# Patient Record
Sex: Female | Born: 1940 | Race: White | Hispanic: No | Marital: Single | Smoking: Current every day smoker
Health system: Southern US, Community
[De-identification: ages and names within clinical notes are randomized; demographics above are authoritative.]

## PROBLEM LIST (undated history)

## (undated) DIAGNOSIS — F0390 Unspecified dementia without behavioral disturbance: Secondary | ICD-10-CM

## (undated) DIAGNOSIS — F0280 Dementia in other diseases classified elsewhere without behavioral disturbance: Secondary | ICD-10-CM

## (undated) DIAGNOSIS — G309 Alzheimer's disease, unspecified: Secondary | ICD-10-CM

---

## 2010-03-10 ENCOUNTER — Ambulatory Visit: Payer: Self-pay | Admitting: Family Medicine

## 2010-03-10 DIAGNOSIS — R030 Elevated blood-pressure reading, without diagnosis of hypertension: Secondary | ICD-10-CM

## 2010-03-10 DIAGNOSIS — J320 Chronic maxillary sinusitis: Secondary | ICD-10-CM | POA: Insufficient documentation

## 2010-11-24 NOTE — Assessment & Plan Note (Signed)
Summary: SINUS INFECTION/JBB   Vital Signs:  Patient Profile:   70 Years Old Female CC:      Cold & URI symptoms/RWT Height:     63 inches Weight:      114 pounds BMI:     20.27 O2 Sat:      96 % O2 treatment:    Room Air Temp:     97.1 degrees F oral Pulse rate:   72 / minute Pulse rhythm:   regular Resp:     20 per minute BP sitting:   156 / 96  (left arm)  Pt. in pain?   no  Vitals Entered By: Levonne Spiller EMT-P (Mar 10, 2010 11:51 AM)              Is Patient Diabetic? No      Current Allergies: No known allergies History of Present Illness History from: patient Reason for visit: see chief complaint Chief Complaint: Cold & URI symptoms/RWT History of Present Illness: Has "been fighting a sinus infection" off and on for about a month. She has used otc nasal rinses, which help some, and mucinex with pseudoephedrine also with very temporary relief. She has a history of sinus difficulties but denies seasonal allergies. She reports maxillary sinus pressure and left ear pressure. + rhinorrhea and occ prod cough. No f/c. + fatigue and stress.   REVIEW OF SYSTEMS Constitutional Symptoms      Denies fever, chills, and night sweats.  Ear/Nose/Throat/Mouth       Complains of frequent runny nose and sinus problems.      Denies dizziness, frequent nose bleeds, sore throat, and hoarseness.  Respiratory       Denies dry cough, productive cough, wheezing, shortness of breath, asthma, bronchitis, and emphysema/COPD.  Cardiovascular       Denies chest pain.      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 She is very active and her diet is appropriate. Reinforced no added salt and to be aware of content of salt in foods. She used to do yoga and I thought this was a good idea to restart. She has not used pseudoephedrine today, and discussed that this may elevate her blood pressure. She can return to have her blood pressure checked here or at a pharmacy 1-2 x / week.  Follow Up: Follow up on an as needed basis  The patient and/or caregiver has been counseled thoroughly with regard to medications prescribed including dosage, schedule, interactions, rationale for use, and possible side effects and they verbalize understanding.  Diagnoses and expected course of recovery discussed and will return if not improved as expected or if the condition worsens. Patient and/or caregiver verbalized understanding.  Prescriptions: ZITHROMAX Z-PAK 250 MG TABS (AZITHROMYCIN) as directed for infection  #  1 pk x 0   Entered and Authorized by:   Tacey Ruiz MD   Signed by:   Tacey Ruiz MD on 03/10/2010   Method used:   Electronically to        [facility] (retail)       [address], [address]       [place], [place]  [address]       Ph: [phone]       Fax: [phone]   RxID:   [id number]   Patient Instructions: 1)  Take your antibiotic as prescribed until ALL of it is gone, but stop if you develop a rash or swelling and contact our office as soon as possible. 2)  Acute sinusitis  symptoms for less than 10 days are not helped by antibiotics.Use warm moist compresses, and over the counter decongestants ( only as directed). Call if no improvement in 5-7 days, sooner if increasing pain, fever, or new symptoms. _____________________________________________________________ The patient was informed that there is no on-call provider or services available at this clinic during off-hours (when the clinic is closed).  If the patient developed a problem or concern that required immediate attention, the patient was advised to go the the nearest available urgent care or emergency department for medical care.  The patient verbalized understanding.     The risks, benefits and possible side effects were clearly explained and discussed with the patient.  The patient verbalized clear understanding.  The patient was given instructions to return if symptoms don't improve, worsen or new changes develop.  If it is not during clinic hours and the patient cannot get back to this clinic then the patient was told to seek medical care at an available urgent care or emergency department.  The patient verbalized understanding.    I have reviewed the above medical office visit documention, including diagnoses, history, medications, clinical lists, orders and plan of care.   Rodney Langton, MD, FAAFP  Mar 10, 2010

## 2014-11-22 DIAGNOSIS — R030 Elevated blood-pressure reading, without diagnosis of hypertension: Secondary | ICD-10-CM | POA: Diagnosis not present

## 2014-11-22 DIAGNOSIS — K122 Cellulitis and abscess of mouth: Secondary | ICD-10-CM | POA: Diagnosis not present

## 2014-12-05 DIAGNOSIS — K0251 Dental caries on pit and fissure surface limited to enamel: Secondary | ICD-10-CM | POA: Diagnosis not present

## 2015-11-23 DIAGNOSIS — G301 Alzheimer's disease with late onset: Secondary | ICD-10-CM | POA: Diagnosis not present

## 2015-11-23 DIAGNOSIS — F0280 Dementia in other diseases classified elsewhere without behavioral disturbance: Secondary | ICD-10-CM | POA: Diagnosis not present

## 2015-11-23 DIAGNOSIS — Z9181 History of falling: Secondary | ICD-10-CM | POA: Diagnosis not present

## 2015-11-24 DIAGNOSIS — G301 Alzheimer's disease with late onset: Secondary | ICD-10-CM | POA: Diagnosis not present

## 2015-11-24 DIAGNOSIS — Z9181 History of falling: Secondary | ICD-10-CM | POA: Diagnosis not present

## 2015-11-24 DIAGNOSIS — F0280 Dementia in other diseases classified elsewhere without behavioral disturbance: Secondary | ICD-10-CM | POA: Diagnosis not present

## 2015-11-25 DIAGNOSIS — G301 Alzheimer's disease with late onset: Secondary | ICD-10-CM | POA: Diagnosis not present

## 2015-11-25 DIAGNOSIS — F0280 Dementia in other diseases classified elsewhere without behavioral disturbance: Secondary | ICD-10-CM | POA: Diagnosis not present

## 2015-11-25 DIAGNOSIS — Z9181 History of falling: Secondary | ICD-10-CM | POA: Diagnosis not present

## 2015-11-28 DIAGNOSIS — G301 Alzheimer's disease with late onset: Secondary | ICD-10-CM | POA: Diagnosis not present

## 2015-11-28 DIAGNOSIS — F0280 Dementia in other diseases classified elsewhere without behavioral disturbance: Secondary | ICD-10-CM | POA: Diagnosis not present

## 2015-11-28 DIAGNOSIS — Z9181 History of falling: Secondary | ICD-10-CM | POA: Diagnosis not present

## 2016-01-14 DIAGNOSIS — Z7689 Persons encountering health services in other specified circumstances: Secondary | ICD-10-CM | POA: Diagnosis not present

## 2016-01-14 DIAGNOSIS — B029 Zoster without complications: Secondary | ICD-10-CM | POA: Diagnosis not present

## 2016-01-14 DIAGNOSIS — Z1389 Encounter for screening for other disorder: Secondary | ICD-10-CM | POA: Diagnosis not present

## 2016-01-19 DIAGNOSIS — B0229 Other postherpetic nervous system involvement: Secondary | ICD-10-CM | POA: Diagnosis not present

## 2016-01-19 DIAGNOSIS — B029 Zoster without complications: Secondary | ICD-10-CM | POA: Diagnosis not present

## 2016-01-19 DIAGNOSIS — Z7689 Persons encountering health services in other specified circumstances: Secondary | ICD-10-CM | POA: Diagnosis not present

## 2016-09-27 DIAGNOSIS — R413 Other amnesia: Secondary | ICD-10-CM | POA: Diagnosis not present

## 2016-09-27 DIAGNOSIS — R946 Abnormal results of thyroid function studies: Secondary | ICD-10-CM | POA: Diagnosis not present

## 2016-09-28 ENCOUNTER — Other Ambulatory Visit: Payer: Self-pay | Admitting: Nurse Practitioner

## 2016-09-28 DIAGNOSIS — F0281 Dementia in other diseases classified elsewhere with behavioral disturbance: Secondary | ICD-10-CM | POA: Diagnosis not present

## 2016-09-28 DIAGNOSIS — F0390 Unspecified dementia without behavioral disturbance: Secondary | ICD-10-CM

## 2016-09-28 DIAGNOSIS — G301 Alzheimer's disease with late onset: Secondary | ICD-10-CM | POA: Diagnosis not present

## 2016-10-01 DIAGNOSIS — F0280 Dementia in other diseases classified elsewhere without behavioral disturbance: Secondary | ICD-10-CM | POA: Diagnosis not present

## 2016-10-01 DIAGNOSIS — Z9181 History of falling: Secondary | ICD-10-CM | POA: Diagnosis not present

## 2016-10-01 DIAGNOSIS — G301 Alzheimer's disease with late onset: Secondary | ICD-10-CM | POA: Diagnosis not present

## 2016-10-04 ENCOUNTER — Ambulatory Visit: Payer: Commercial Managed Care - HMO

## 2018-09-10 ENCOUNTER — Emergency Department: Payer: Medicare Other

## 2018-09-10 ENCOUNTER — Other Ambulatory Visit: Payer: Self-pay

## 2018-09-10 ENCOUNTER — Inpatient Hospital Stay
Admission: EM | Admit: 2018-09-10 | Discharge: 2018-09-14 | DRG: 871 | Disposition: A | Discharge status: Skilled Nursing Facility | Payer: Medicare Other | Source: Skilled Nursing Facility | Attending: Internal Medicine | Admitting: Internal Medicine

## 2018-09-10 DIAGNOSIS — I248 Other forms of acute ischemic heart disease: Secondary | ICD-10-CM | POA: Diagnosis present

## 2018-09-10 DIAGNOSIS — E039 Hypothyroidism, unspecified: Secondary | ICD-10-CM | POA: Diagnosis present

## 2018-09-10 DIAGNOSIS — E871 Hypo-osmolality and hyponatremia: Secondary | ICD-10-CM | POA: Diagnosis present

## 2018-09-10 DIAGNOSIS — I340 Nonrheumatic mitral (valve) insufficiency: Secondary | ICD-10-CM | POA: Diagnosis not present

## 2018-09-10 DIAGNOSIS — J440 Chronic obstructive pulmonary disease with acute lower respiratory infection: Secondary | ICD-10-CM | POA: Diagnosis present

## 2018-09-10 DIAGNOSIS — R05 Cough: Secondary | ICD-10-CM | POA: Diagnosis present

## 2018-09-10 DIAGNOSIS — E43 Unspecified severe protein-calorie malnutrition: Secondary | ICD-10-CM

## 2018-09-10 DIAGNOSIS — R1310 Dysphagia, unspecified: Secondary | ICD-10-CM | POA: Diagnosis present

## 2018-09-10 DIAGNOSIS — Z66 Do not resuscitate: Secondary | ICD-10-CM | POA: Diagnosis present

## 2018-09-10 DIAGNOSIS — E878 Other disorders of electrolyte and fluid balance, not elsewhere classified: Secondary | ICD-10-CM | POA: Diagnosis present

## 2018-09-10 DIAGNOSIS — E876 Hypokalemia: Secondary | ICD-10-CM | POA: Diagnosis present

## 2018-09-10 DIAGNOSIS — Z681 Body mass index (BMI) 19 or less, adult: Secondary | ICD-10-CM

## 2018-09-10 DIAGNOSIS — J441 Chronic obstructive pulmonary disease with (acute) exacerbation: Secondary | ICD-10-CM | POA: Diagnosis present

## 2018-09-10 DIAGNOSIS — F0280 Dementia in other diseases classified elsewhere without behavioral disturbance: Secondary | ICD-10-CM | POA: Diagnosis present

## 2018-09-10 DIAGNOSIS — J9601 Acute respiratory failure with hypoxia: Secondary | ICD-10-CM | POA: Diagnosis not present

## 2018-09-10 DIAGNOSIS — G309 Alzheimer's disease, unspecified: Secondary | ICD-10-CM | POA: Diagnosis present

## 2018-09-10 DIAGNOSIS — Z515 Encounter for palliative care: Secondary | ICD-10-CM

## 2018-09-10 DIAGNOSIS — J9621 Acute and chronic respiratory failure with hypoxia: Secondary | ICD-10-CM | POA: Diagnosis present

## 2018-09-10 DIAGNOSIS — J189 Pneumonia, unspecified organism: Secondary | ICD-10-CM | POA: Diagnosis not present

## 2018-09-10 DIAGNOSIS — F17200 Nicotine dependence, unspecified, uncomplicated: Secondary | ICD-10-CM | POA: Diagnosis present

## 2018-09-10 DIAGNOSIS — D649 Anemia, unspecified: Secondary | ICD-10-CM | POA: Diagnosis present

## 2018-09-10 DIAGNOSIS — J9622 Acute and chronic respiratory failure with hypercapnia: Secondary | ICD-10-CM | POA: Diagnosis present

## 2018-09-10 DIAGNOSIS — A419 Sepsis, unspecified organism: Principal | ICD-10-CM | POA: Diagnosis present

## 2018-09-10 DIAGNOSIS — J9600 Acute respiratory failure, unspecified whether with hypoxia or hypercapnia: Secondary | ICD-10-CM

## 2018-09-10 DIAGNOSIS — E860 Dehydration: Secondary | ICD-10-CM | POA: Diagnosis present

## 2018-09-10 DIAGNOSIS — J188 Other pneumonia, unspecified organism: Secondary | ICD-10-CM

## 2018-09-10 DIAGNOSIS — I351 Nonrheumatic aortic (valve) insufficiency: Secondary | ICD-10-CM | POA: Diagnosis not present

## 2018-09-10 DIAGNOSIS — I361 Nonrheumatic tricuspid (valve) insufficiency: Secondary | ICD-10-CM | POA: Diagnosis not present

## 2018-09-10 DIAGNOSIS — E870 Hyperosmolality and hypernatremia: Secondary | ICD-10-CM | POA: Diagnosis present

## 2018-09-10 DIAGNOSIS — R0902 Hypoxemia: Secondary | ICD-10-CM

## 2018-09-10 HISTORY — DX: Unspecified dementia, unspecified severity, without behavioral disturbance, psychotic disturbance, mood disturbance, and anxiety: F03.90

## 2018-09-10 LAB — MRSA PCR SCREENING: MRSA BY PCR: NEGATIVE

## 2018-09-10 LAB — COMPREHENSIVE METABOLIC PANEL
ALBUMIN: 2.9 g/dL — AB (ref 3.5–5.0)
ALK PHOS: 159 U/L — AB (ref 38–126)
ALT: 79 U/L — AB (ref 0–44)
ANION GAP: 10 (ref 5–15)
AST: 63 U/L — AB (ref 15–41)
BUN: 27 mg/dL — AB (ref 8–23)
CALCIUM: 8.9 mg/dL (ref 8.9–10.3)
CO2: 31 mmol/L (ref 22–32)
Chloride: 109 mmol/L (ref 98–111)
Creatinine, Ser: 0.78 mg/dL (ref 0.44–1.00)
GFR calc Af Amer: >60 mL/min (ref >60)
GFR calc non Af Amer: >60 mL/min (ref >60)
Glucose, Bld: 161 mg/dL — ABNORMAL HIGH (ref 70–99)
Potassium: 3.7 mmol/L (ref 3.5–5.1)
SODIUM: 150 mmol/L — AB (ref 135–145)
Total Bilirubin: 0.9 mg/dL (ref 0.3–1.2)
Total Protein: 6.7 g/dL (ref 6.5–8.1)

## 2018-09-10 LAB — MAGNESIUM: Magnesium: 2.1 mg/dL (ref 1.7–2.4)

## 2018-09-10 LAB — CBC WITH DIFFERENTIAL/PLATELET
ABS IMMATURE GRANULOCYTES: 0.35 10*3/uL — AB (ref 0.00–0.07)
Basophils Absolute: 0.1 10*3/uL (ref 0.0–0.1)
Basophils Relative: 0 %
EOS ABS: 0.1 10*3/uL (ref 0.0–0.5)
Eosinophils Relative: 0 %
HCT: 37.9 % (ref 36.0–46.0)
Hemoglobin: 11.9 g/dL — ABNORMAL LOW (ref 12.0–15.0)
IMMATURE GRANULOCYTES: 1 %
Lymphocytes Relative: 2 %
Lymphs Abs: 0.5 10*3/uL — ABNORMAL LOW (ref 0.7–4.0)
MCH: 31.4 pg (ref 26.0–34.0)
MCHC: 31.4 g/dL (ref 30.0–36.0)
MCV: 100 fL (ref 80.0–100.0)
MONOS PCT: 7 %
Monocytes Absolute: 2 10*3/uL — ABNORMAL HIGH (ref 0.1–1.0)
NEUTROS PCT: 90 %
Neutro Abs: 24.3 10*3/uL — ABNORMAL HIGH (ref 1.7–7.7)
Platelets: 652 10*3/uL — ABNORMAL HIGH (ref 150–400)
RBC: 3.79 MIL/uL — ABNORMAL LOW (ref 3.87–5.11)
RDW: 13.6 % (ref 11.5–15.5)
WBC: 27.3 10*3/uL — ABNORMAL HIGH (ref 4.0–10.5)
nRBC: 0 % (ref 0.0–0.2)

## 2018-09-10 LAB — CG4 I-STAT (LACTIC ACID)
LACTIC ACID, VENOUS: 1.9 mmol/L (ref 0.5–1.9)
Lactic Acid, Venous: 1.69 mmol/L (ref 0.5–1.9)

## 2018-09-10 LAB — PROTIME-INR
INR: 1.12
Prothrombin Time: 14.3 seconds (ref 11.4–15.2)

## 2018-09-10 LAB — GLUCOSE, CAPILLARY: GLUCOSE-CAPILLARY: 176 mg/dL — AB (ref 70–99)

## 2018-09-10 LAB — BRAIN NATRIURETIC PEPTIDE: B NATRIURETIC PEPTIDE 5: 314 pg/mL — AB (ref 0.0–100.0)

## 2018-09-10 LAB — TROPONIN I: TROPONIN I: 0.3 ng/mL — AB (ref <0.03)

## 2018-09-10 LAB — TSH: TSH: <0.01 u[IU]/mL — ABNORMAL LOW (ref 0.350–4.500)

## 2018-09-10 MED ORDER — ONDANSETRON HCL 4 MG/2ML IJ SOLN: 4.0000 mg | Freq: Four times a day (QID) | INTRAMUSCULAR | Status: DC | PRN

## 2018-09-10 MED ORDER — ORAL CARE MOUTH RINSE
15.0000 mL | Freq: Two times a day (BID) | OROMUCOSAL | Status: DC
  Administered 2018-09-10 – 2018-09-14 (×5): 15 mL via OROMUCOSAL
  Filled 2018-09-10: qty 15

## 2018-09-10 MED ORDER — ALBUTEROL SULFATE (2.5 MG/3ML) 0.083% IN NEBU
7.5000 mg | INHALATION_SOLUTION | Freq: Once | RESPIRATORY_TRACT | Status: AC
  Administered 2018-09-10: 7.5 mg via RESPIRATORY_TRACT
  Filled 2018-09-10: qty 9

## 2018-09-10 MED ORDER — ONDANSETRON HCL 4 MG PO TABS: 4.0000 mg | ORAL_TABLET | Freq: Four times a day (QID) | ORAL | Status: DC | PRN

## 2018-09-10 MED ORDER — VANCOMYCIN HCL IN DEXTROSE 1-5 GM/200ML-% IV SOLN
1000.0000 mg | Freq: Once | INTRAVENOUS | Status: AC
  Administered 2018-09-10: 1000 mg via INTRAVENOUS
  Filled 2018-09-10: qty 200

## 2018-09-10 MED ORDER — ACETAMINOPHEN 325 MG PO TABS: 650.0000 mg | ORAL_TABLET | Freq: Four times a day (QID) | ORAL | Status: DC | PRN

## 2018-09-10 MED ORDER — CHLORHEXIDINE GLUCONATE 0.12 % MT SOLN
15.0000 mL | Freq: Two times a day (BID) | OROMUCOSAL | Status: DC
  Administered 2018-09-10 – 2018-09-14 (×6): 15 mL via OROMUCOSAL
  Filled 2018-09-10 (×6): qty 15

## 2018-09-10 MED ORDER — SODIUM CHLORIDE 0.9 % IV SOLN
2.0000 g | Freq: Once | INTRAVENOUS | Status: AC
  Administered 2018-09-10: 2 g via INTRAVENOUS
  Filled 2018-09-10: qty 2

## 2018-09-10 MED ORDER — RISPERIDONE 1 MG PO TABS
1.0000 mg | ORAL_TABLET | Freq: Two times a day (BID) | ORAL | Status: DC
  Administered 2018-09-12 – 2018-09-14 (×5): 1 mg via ORAL
  Filled 2018-09-10 (×9): qty 1

## 2018-09-10 MED ORDER — METHYLPREDNISOLONE SODIUM SUCC 40 MG IJ SOLR
40.0000 mg | Freq: Two times a day (BID) | INTRAMUSCULAR | Status: DC
  Administered 2018-09-11 – 2018-09-12 (×3): 40 mg via INTRAVENOUS
  Filled 2018-09-10 (×3): qty 1

## 2018-09-10 MED ORDER — IPRATROPIUM-ALBUTEROL 0.5-2.5 (3) MG/3ML IN SOLN
3.0000 mL | RESPIRATORY_TRACT | Status: DC
  Administered 2018-09-10 – 2018-09-12 (×13): 3 mL via RESPIRATORY_TRACT
  Filled 2018-09-10 (×13): qty 3

## 2018-09-10 MED ORDER — ACETAMINOPHEN 650 MG RE SUPP: 650.0000 mg | Freq: Four times a day (QID) | RECTAL | Status: DC | PRN

## 2018-09-10 MED ORDER — DEXTROSE-NACL 5-0.45 % IV SOLN
INTRAVENOUS | Status: DC
  Administered 2018-09-10 – 2018-09-13 (×4): via INTRAVENOUS

## 2018-09-10 MED ORDER — ENOXAPARIN SODIUM 30 MG/0.3ML ~~LOC~~ SOLN
30.0000 mg | SUBCUTANEOUS | Status: DC
  Administered 2018-09-10 – 2018-09-13 (×4): 30 mg via SUBCUTANEOUS
  Filled 2018-09-10 (×4): qty 0.3

## 2018-09-10 MED ORDER — IPRATROPIUM-ALBUTEROL 0.5-2.5 (3) MG/3ML IN SOLN
6.0000 mL | Freq: Once | RESPIRATORY_TRACT | Status: AC
  Administered 2018-09-10: 6 mL via RESPIRATORY_TRACT
  Filled 2018-09-10: qty 6

## 2018-09-10 MED ORDER — ALBUTEROL SULFATE (2.5 MG/3ML) 0.083% IN NEBU
2.5000 mg | INHALATION_SOLUTION | Freq: Once | RESPIRATORY_TRACT | Status: AC
  Administered 2018-09-10: 2.5 mg via RESPIRATORY_TRACT
  Filled 2018-09-10: qty 3

## 2018-09-10 MED ORDER — METHYLPREDNISOLONE SODIUM SUCC 125 MG IJ SOLR
125.0000 mg | Freq: Once | INTRAMUSCULAR | Status: AC
  Administered 2018-09-10: 125 mg via INTRAVENOUS
  Filled 2018-09-10: qty 2

## 2018-09-10 MED ORDER — METHIMAZOLE 5 MG PO TABS
5.0000 mg | ORAL_TABLET | Freq: Every day | ORAL | Status: DC
  Administered 2018-09-12 – 2018-09-14 (×3): 5 mg via ORAL
  Filled 2018-09-10 (×4): qty 1

## 2018-09-10 MED ORDER — SODIUM CHLORIDE 0.9 % IV SOLN
2.0000 g | Freq: Two times a day (BID) | INTRAVENOUS | Status: DC
  Administered 2018-09-11 – 2018-09-12 (×3): 2 g via INTRAVENOUS
  Filled 2018-09-10 (×4): qty 2

## 2018-09-10 MED ORDER — SODIUM CHLORIDE 0.9 % IV BOLUS
1000.0000 mL | Freq: Once | INTRAVENOUS | Status: AC
  Administered 2018-09-10: 1000 mL via INTRAVENOUS

## 2018-09-10 MED ORDER — VANCOMYCIN HCL IN DEXTROSE 750-5 MG/150ML-% IV SOLN
750.0000 mg | INTRAVENOUS | Status: DC
  Filled 2018-09-10: qty 150

## 2018-09-10 MED ORDER — ENOXAPARIN SODIUM 40 MG/0.4ML ~~LOC~~ SOLN
30.0000 mg | SUBCUTANEOUS | Status: DC
  Filled 2018-09-10: qty 0.4

## 2018-09-10 MED ORDER — BUDESONIDE 0.5 MG/2ML IN SUSP
0.5000 mg | Freq: Two times a day (BID) | RESPIRATORY_TRACT | Status: DC
  Administered 2018-09-10 – 2018-09-14 (×8): 0.5 mg via RESPIRATORY_TRACT
  Filled 2018-09-10 (×8): qty 2

## 2018-09-10 NOTE — Consult Note (Signed)
   Name: Shelly NorrieDiane Freeman MRN: [id number] DOB: 03/08/1941     CONSULTATION DATE: 09/10/2018  REFERRING MD :  Rinaldo RatelKhalisetti  CHIEF COMPLAINT:  SOB  STUDIES:  CXR 11.17.19 multifocal opacities C/w pneumonia   HISTORY OF PRESENT ILLNESS:  77 y.o. female with a known history of moderate Alzheimer's dementia who just moved into an assisted living facility brought in secondary to worsening cough, fever and congestion since yesterday. + very malnourished a from assisted living facility + noted to be dyspneic and tachycardic placed on biPAP    Being admitted to stepdown for treatment of multifocal pneumonia.  Based on CXR increased lung volumes, flattened diapghrams c/w COPD Comatosed, stuperous  PAST MEDICAL HISTORY :   has a past medical history of Dementia (HCC).  has no past surgical history on file. Prior to Admission medications   Medication Sig Start Date End Date Taking? Authorizing Provider  risperiDONE (RISPERDAL) 1 MG tablet Take 1 tablet by mouth. Take 1 tab at bedtime. May repeat after 1 hour if needed for agitation. 08/18/18  Yes [provider]   No Known Allergies  FAMILY HISTORY:  Family history is unknown by patient. SOCIAL HISTORY:  reports that she has been smoking. She has never used smokeless tobacco. She reports that she does not drink alcohol or use drugs.  REVIEW OF SYSTEMS:   Unable to obtain due to critical illness   VITAL SIGNS: Temp:  [98 F (36.7 C)] 98 F (36.7 C) (11/17 1352) Pulse Rate:  [36-106] 36 (11/17 1600) Resp:  [17-29] 20 (11/17 1600) BP: (114-144)/(63-100) 114/63 (11/17 1600) SpO2:  [87 %-99 %] 99 % (11/17 1600) Weight:  [45.4 kg] 45.4 kg (11/17 1343)  Physical Examination:  GENERAL:critically ill appearing, +resp distress HEAD: Normocephalic, atraumatic.  EYES: Pupils equal, round, reactive to light.  No scleral icterus.  MOUTH: Moist mucosal membrane. NECK: Supple. No JVD.  PULMONARY: +rhonchi,  +wheezing CARDIOVASCULAR: S1 and S2. Regular rate and rhythm. No murmurs, rubs, or gallops.  GASTROINTESTINAL: Soft, nontender, -distended. No masses. Positive bowel sounds. No hepatosplenomegaly.  MUSCULOSKELETAL: No swelling, clubbing, or edema.  NEUROLOGIC: obtunded SKIN:intact,warm,dry    ASSESSMENT / PLAN: 77 yo very thin and frail WF with acute and severe resp failure with COPD exacerbation and multifocal pneumonia on biPAP  Severe Hypoxic and Hypercapnic Respiratory Failure Wean off biPAP as tolerated  SEVERE COPD EXACERBATION -continue IV steroids as prescribed -continue NEB THERAPY as prescribed -morphine as needed -wean fio2 as needed and tolerated  INFECTIOUS DISEASE-multifocal pneumonia -continue antibiotics as prescribed -follow up cultures     Recommend DNR status Recommend hospice referral for end stage dementia and failure thrive   Critical Care Time devoted to patient care services described in this note is 45 minutes.   Overall, patient is critically ill, prognosis is guarded.  Patient with Multiorgan failure and at high risk for cardiac arrest and death.    Lucie LeatherKurian David Melyssa Signor, M.D.  [facility]  Medical Director [facility] Medical Director [facility] Cardio-Pulmonary Department

## 2018-09-10 NOTE — Progress Notes (Signed)
Pt taken off bipap, placed on 2lpm [place], sats 92%, respiratory rate 16/min. Tolerating well at this time. Will continue to monitor.

## 2018-09-10 NOTE — ED Notes (Signed)
Pt constantly attempting to pull bi-pap off

## 2018-09-10 NOTE — ED Notes (Signed)
Dr Pershing ProudSchaevitz notified of elevated troponin of 0.30 and pt desat - new orders received and pt placed on bi-pap

## 2018-09-10 NOTE — Clinical Social Work Note (Signed)
Clinical Social Work Assessment  Patient Details  Name: Shelly NorrieDiane Warmack MRN: [id number] Date of Birth: 09/16/1941  Date of referral:  09/10/18               Reason for consult:  Other (Comment Required)(from [facility] )                Permission sought to share information with:    Permission granted to share information::     Name::        Agency::     Relationship::     Contact Information:     Housing/Transportation Living arrangements for the past 2 months:  Assisted Living Facility Source of Information:  Facility Patient Interpreter Needed:  None Criminal Activity/Legal Involvement Pertinent to Current Situation/Hospitalization:  No - Comment as needed Significant Relationships:  Adult Children Lives with:  Facility Resident Do you feel safe going back to the place where you live?  Yes Need for family participation in patient care:  Yes (Comment)  Care giving concerns:  [facility] located at [address], [place] (fax: [phone]).   Social Worker assessment / plan:  Visual merchandiserClinical Social Worker (CSW) reviewed chart and noted that patient is from [facility]. Per chart patient is confused and no family is at bedside. CSW contacted [facility] administrator to complete assessment. Per Liborio NixonJanice patient was [place] [facility] because patient's 2 sons live in [place]. Per Liborio NixonJanice DSS is not pursing guardianship because patient's sons did sign paper work for placement. Per Liborio NixonJanice she will follow up with CSW tomorrow and give sons contact information. Per Liborio NixonJanice patient is independent with ambulation at baseline and does not use a walker. Per Liborio NixonJanice patient is on room air and can return to the facility when stable for D/C. Per Liborio NixonJanice the sons are patient's decision makers however they are hard to get in touch with. FL2 complete. CSW attempted to  contact Greggory StallionGeorge listed on patient's face sheet however he did not answer and his voicemail was set up. CSW will continue to follow and assist as needed.   Employment status:  Disabled (Comment on whether or not currently receiving Disability), Retired Health and safety inspectornsurance information:  Medic[place], [place] Recommendations:  Not assessed at this time Information / Referral to community resources:  Other (Comment Required)(Patient will return to ALF )  Patient/Family's Response to care:  [facility] administrator patient can return to the facility.   Patient/Family's Understanding of and Emotional Response to Diagnosis, Current Treatment, and Prognosis:  Liborio NixonJanice was pleasant and thanked CSW assistance.   Emotional Assessment Appearance:  Appears stated age Attitude/Demeanor/Rapport:  Unable to Assess Affect (typically observed):  Unable to Assess Orientation:  Oriented to Self, Fluctuating Orientation (Suspected and/or reported Sundowners) Alcohol / Substance use:  Not Applicable Psych involvement (Current and /or in the community):  No (Comment)  Discharge Needs  Concerns to be addressed:  Discharge Planning Concerns Readmission within the last 30 days:  No Current discharge risk:  Chronically ill Barriers to Discharge:  Continued Medical Work up   [employer], Darleen CrockerBailey M, LCSW 09/10/2018, 4:16 PM

## 2018-09-10 NOTE — H&P (Signed)
[facility]   PATIENT NAME: Shelly NorrieDiane Duran    MR#:  [id number]  DATE OF BIRTH:  02/01/1941  DATE OF ADMISSION:  09/10/2018  PRIMARY CARE PHYSICIAN:  Dr. Jerl MinaJames Hedrick  REQUESTING/REFERRING PHYSICIAN: Dr.David Schaevitz  CHIEF COMPLAINT:   Chief Complaint  Patient presents with  . Cough  . Shortness of Breath    HISTORY OF PRESENT ILLNESS:  Shelly Duran  is a 77 y.o. female with a known history of moderate Alzheimer's dementia who just moved into an assisted living facility brought in secondary to worsening cough, fever and congestion since yesterday. Due to her underlying dementia, patient is a very poor historian.  She has seen her PCP last month and was noted to be very malnourished and family concerned that she is forgetting to eat.  Patient is followed by social workers and adult protective services.  Due to these concerns, patient is more to assisted living facility.  Most of the history is obtained from the ER physician and the charts.  Apparently she has been coughing and was running a low-grade fever since yesterday.  She sounds pretty hoarse in the ED today and chest x-ray confirming multifocal pneumonia.  She has elevated white count and is noted to be dyspneic and tachycardic.  Lactic acid is within normal limits.  She was started on BiPAP for her breathing. Being admitted to stepdown for treatment of multifocal pneumonia.  PAST MEDICAL HISTORY:   Past Medical History:  Diagnosis Date  . Dementia (HCC)     PAST SURGICAL HISTORY:  History reviewed. No pertinent surgical history.  SOCIAL HISTORY:   Social History   Tobacco Use  . Smoking status: Current Every Day Smoker  . Smokeless tobacco: Never Used  Substance Use Topics  . Alcohol use: Never    Frequency: Never    FAMILY HISTORY:   Family History  Family history unknown: Yes    DRUG ALLERGIES:  No Known Allergies  REVIEW OF SYSTEMS:   Review of Systems    Unable to perform ROS: Dementia    MEDICATIONS AT HOME:   Prior to Admission medications   Medication Sig Start Date End Date Taking? Authorizing Provider  risperiDONE (RISPERDAL) 1 MG tablet Take 1 tablet by mouth. Take 1 tab at bedtime. May repeat after 1 hour if needed for agitation. 08/18/18  Yes [provider]      VITAL SIGNS:  Blood pressure (!) 144/100, pulse (!) 104, temperature 98 F (36.7 C), temperature source Axillary, resp. rate 19, height 5\' 3"  (1.6 m), weight 45.4 kg, SpO2 93 %.  PHYSICAL EXAMINATION:   Physical Exam  GENERAL:  77 y.o.-year-old patient lying in the bed with no acute distress.  EYES: Pupils equal, round, reactive to light and accommodation. No scleral icterus. Extraocular muscles intact.  HEENT: Head atraumatic, normocephalic. Oropharynx and nasopharynx clear.  NECK:  Supple, no jugular venous distention. No thyroid enlargement, no tenderness.  LUNGS: Normal breath sounds bilaterally, no wheezing, coarse rhonchi bilaterally, no rales  or crepitation. No use of accessory muscles of respiration.  CARDIOVASCULAR: S1, S2 normal. No  rubs, or gallops. 3/6 systolic murmur present ABDOMEN: Soft, nontender, nondistended. Bowel sounds present. No organomegaly or mass.  EXTREMITIES: No pedal edema, cyanosis, or clubbing.  NEUROLOGIC: Cranial nerves II through XII are intact. Muscle strength 5/5 in all extremities. Sensation intact. Gait not checked.  PSYCHIATRIC: The patient is alert and oriented to self.  SKIN: No obvious rash, lesion, or  ulcer.   LABORATORY PANEL:   CBC Recent Labs  Lab 09/10/18 1349  WBC 27.3*  HGB 11.9*  HCT 37.9  PLT 652*   ------------------------------------------------------------------------------------------------------------------  Chemistries  Recent Labs  Lab 09/10/18 1349  NA 150*  K 3.7  CL 109  CO2 31  GLUCOSE 161*  BUN 27*  CREATININE 0.78  CALCIUM 8.9  AST 63*  ALT 79*  ALKPHOS 159*   BILITOT 0.9   ------------------------------------------------------------------------------------------------------------------  Cardiac Enzymes Recent Labs  Lab 09/10/18 1349  TROPONINI 0.30*   ------------------------------------------------------------------------------------------------------------------  RADIOLOGY:  Dg Chest Portable 1 View  Result Date: 09/10/2018 CLINICAL DATA:  Cough and fever. EXAM: PORTABLE CHEST 1 VIEW COMPARISON:  None. FINDINGS: The heart size and mediastinal contours are within normal limits. Normal pulmonary vascularity. The lungs are hyperinflated with emphysematous changes and coarsened interstitial markings. Patchy reticulonodular densities in the right upper and lower lobes. No pleural effusion or pneumothorax. No acute osseous abnormality. IMPRESSION: 1. COPD. Patchy reticulonodular densities in the right upper and lower lobes likely reflect superimposed infection or inflammation. Followup to resolution is recommended. Electronically Signed   By: Obie Dredge M.D.   On: 09/10/2018 14:32    EKG:   Orders placed or performed during the hospital encounter of 09/10/18  . EKG 12-Lead  . EKG 12-Lead  . ED EKG 12-Lead  . ED EKG 12-Lead  . EKG 12-Lead  . EKG 12-Lead    IMPRESSION AND PLAN:   Shelly Duran  is a 77 y.o. female with a known history of moderate Alzheimer's dementia who just moved into an assisted living facility brought in secondary to worsening cough, fever and congestion since yesterday.  1. Sepsis-secondary to multifocal pneumonia.  No aspiration reported -MRSA PCR pending. -Blood cultures ordered -Started on vancomycin and cefepime.  White count is significantly elevated.  Continue to monitor -Also check urine analysis  2.  Acute hypoxic respiratory failure-secondary to multifocal pneumonia -Currently using BiPAP.  Admit to stepdown and wean as tolerated.  Not on oxygen at the assisted living facility.  Chest x-ray confirming  the multifocal pneumonia  3.  Hypernatremia-we will start gentle hydration with D5 half-normal saline for now and monitor  4.  Dementia-seems to be confused at baseline.  Might have behavioral issues as using risperidone twice daily and also as needed.  5.  DVT prophylaxis-Lovenox  Physical therapy consulted. -Also palliative care consulted for goals of care conversation -Patient unable to participate in the conversation due to her mental status.  Try to reach her family number listed here and was unable to leave a voicemail.  All the records are reviewed and case discussed with ED provider. Management plans discussed with the patient, family and they are in agreement.  CODE STATUS: Full Code  TOTAL CRITICAL CARE TIME SPENT IN TAKING CARE OF THIS PATIENT: 58 minutes.    Enid Baas M.D on 09/10/2018 at 3:46 PM  Between 7am to 6pm - Pager - [phone]  After 6pm go to [email or web address] - password [employer]  [facility]  Office  [phone]  CC: Primary care physician; Patient, No Pcp Per

## 2018-09-10 NOTE — Progress Notes (Signed)
CODE SEPSIS - PHARMACY COMMUNICATION  **Broad Spectrum Antibiotics should be administered within 1 hour of Sepsis diagnosis**  Time Code Sepsis Called/Page Received: 11/17 @1445   Antibiotics Ordered: cefepime 2 g x 1 and vancomycin 1000 mg x 1   Time of 1st antibiotic administration: 11/17 @1452   Additional action taken by pharmacy: None    Ronnald RampKishan S Bryla Burek ,PharmD Clinical Pharmacist  09/10/2018  2:47 PM

## 2018-09-10 NOTE — Progress Notes (Signed)
Pharmacy Antibiotic Note  Shelly Duran is a 77 y.o. female admitted on 09/10/2018 with pneumonia.  Pharmacy has been consulted for cefepime and vancomycin dosing. 77 yo very thin and frail WF with acute and severe resp failure with COPD exacerbation and multifocal pneumonia on biPAP.   Ke: 0.038, t1/2: 18.24, Vd 31.78.   Plan: Patient received vancomycin 1000 mg x1 in the ED. Will start a maintenance dose of 750 mg q24H with 18 hours stack dosing. Predicative trough of ~18 mcg/mL. Plan to obtain vancomycin level prior to the 4th dose. Goal trough 15-20 mcg/mL.   Will start cefepime 2 g q12H per renal function.  Height: 4\' 10"  (147.3 cm) Weight: 100 lb 1.4 oz (45.4 kg) IBW/kg (Calculated) : 40.9  Temp (24hrs), Avg:98.4 F (36.9 C), Min:98 F (36.7 C), Max:98.8 F (37.1 C)  Recent Labs  Lab 09/10/18 1349 09/10/18 1354 09/10/18 1557  WBC 27.3*  --   --   CREATININE 0.78  --   --   LATICACIDVEN  --  1.69 1.90    Estimated Creatinine Clearance: 39.2 mL/min (by C-G formula based on SCr of 0.78 mg/dL).    No Known Allergies  Antimicrobials this admission: 11/17 cefepime >>  11/17 vancomycin >>   Dose adjustments this admission: Cefepime 2 g q12H per renal function (CrCl: 30-5750ml/min)  Microbiology results: 11/17 BCx: pending  11/17 MRSA PCR: ordered - recommend d/c vancomycin if MRSA PCR is negative.   Thank you for allowing pharmacy to be a part of this patient's care.  Ronnald RampKishan S Dashiel Bergquist, PharmD  Clinical Pharmacist 09/10/2018 5:07 PM

## 2018-09-10 NOTE — NC FL2 (Addendum)
  [place] MEDICAID FL2 LEVEL OF CARE SCREENING TOOL     IDENTIFICATION  Patient Name: Shelly Duran Birthdate: 08/07/1941 Sex: female Admission Date (Current Location): 09/10/2018  [facility] [place]  [place] ([id number]) Facility and Address:  [facility], [address], [place], [place] [id number]      Provider Number: [id number]  Attending Physician Name and Address:  Enid BaasKalisetti, Radhika, MD  Relative Name and Phone Number:       Current Level of Care: Hospital Recommended Level of Care: Assisted Living Facility, Memory Care Prior Approval Number:    Date Approved/Denied:   PASRR Number:    Discharge Plan: Domiciliary (Rest home)(Memory Care )    Current Diagnoses: Primary: Dementia  Patient Active Problem List   Diagnosis Date Noted  . Sepsis (HCC) 09/10/2018  . 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 ELEVATED BP READING WITHOUT DX HYPERTENSION 03/10/2010    Orientation RESPIRATION BLADDER Height & Weight     Self  2 liters O2 Continent Weight: 100 lb (45.4 kg) Height:  5\' 3"  (160 cm)  BEHAVIORAL SYMPTOMS/MOOD NEUROLOGICAL BOWEL NUTRITION STATUS      Continent Dysphagia 1  AMBULATORY STATUS COMMUNICATION OF NEEDS Skin   Extensive Verbally Normal                       Personal Care Assistance Level of Assistance  Bathing, Feeding, Dressing Bathing Assistance: Limited assistance Feeding assistance: Independent Dressing Assistance: Limited assistance     Functional Limitations Info  Sight, Hearing, Speech Sight Info: Adequate Hearing Info: Adequate Speech Info: Adequate    SPECIAL CARE FACTORS FREQUENCY  PT (By licensed PT)     PT Frequency: (2-3 home health. )              Contractures      Additional Factors Info  Code Status, Allergies   Home Health Code Status Info: (Full Code. ) Allergies Info: (No Known Allergies. )             DISCHARGE MEDICATIONS:   Allergies as of 09/14/2018    No Known Allergies        Medication List    TAKE these medications   amoxicillin-clavulanate 875-125 MG tablet Commonly known as:  AUGMENTIN Take 1 tablet by mouth every 12 (twelve) hours for 7 days.   guaiFENesin 600 MG 12 hr tablet Commonly known as:  MUCINEX Take 1 tablet (600 mg total) by mouth 2 (two) times daily for 15 days.   ipratropium-albuterol 0.5-2.5 (3) MG/3ML Soln Commonly known as:  DUONEB Take 3 mLs by nebulization every 6 (six) hours as needed.   methimazole 5 MG tablet Commonly known as:  TAPAZOLE Take 1 tablet (5 mg total) by mouth daily.   predniSONE 20 MG tablet Commonly known as:  DELTASONE Take 2 tablets (40 mg total) by mouth daily for 4 days.   risperiDONE 1 MG tablet Commonly known as:  RISPERDAL Take 1 tablet by mouth. Take 1 tab at bedtime. May repeat after 1 hour if needed for agitation.      Additional Information (SSN: [id number])  Sample, Darleen CrockerBailey M, LCSW

## 2018-09-10 NOTE — ED Provider Notes (Addendum)
[facility] Emergency Department Provider Note  ____________________________________________   First MD Initiated Contact with Patient 09/10/18 1340     (approximate)  I have reviewed the triage vital signs and the nursing notes.   HISTORY  Chief Complaint Cough and Shortness of Breath   HPI Shelly Duran is a 77 y.o. female with a history of dementia who is presenting to the emergency department today with a cough and hypoxia.  EMS reports that the patient's room air O2 sat was in the 80s.  Placed on nasal cannula oxygen.  Patient also with a "wet sounding cough."  Patient denies any pain.  Denies any history of COPD.   History reviewed. No pertinent past medical history.  Patient Active Problem List   Diagnosis Date Noted  . 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 ELEVATED BP READING WITHOUT DX HYPERTENSION 03/10/2010    History reviewed. No pertinent surgical history.  Prior to Admission medications   Not on File    Allergies Patient has no known allergies.  No family history on file.  Social History Social History   Tobacco Use  . Smoking status: Never Smoker  . Smokeless tobacco: Never Used  Substance Use Topics  . Alcohol use: Never    Frequency: Never  . Drug use: Never    Review of Systems  Constitutional: No fever/chills Eyes: No visual changes. ENT: No sore throat. Cardiovascular: Denies chest pain. Respiratory: As above Gastrointestinal: No abdominal pain.  No nausea, no vomiting.  No diarrhea.  No constipation. Genitourinary: Negative for dysuria. Musculoskeletal: Negative for back pain. Skin: Negative for rash. Neurological: Negative for headaches, focal weakness or numbness.   ____________________________________________   PHYSICAL EXAM:  VITAL SIGNS: ED Triage Vitals  Enc Vitals Group     BP 09/10/18 1352 (!) 144/100     Pulse Rate 09/10/18 1352 (!) 106     Resp 09/10/18 1352 (!) 24     Temp 09/10/18  1352 98 F (36.7 C)     Temp Source 09/10/18 1352 Axillary     SpO2 09/10/18 1342 (!) 88 %     Weight 09/10/18 1343 100 lb (45.4 kg)     Height 09/10/18 1343 5\' 3"  (1.6 m)     Head Circumference --      Peak Flow --      Pain Score 09/10/18 1343 0     Pain Loc --      Pain Edu? --      Excl. in GC? --     Constitutional: Alert and oriented.  Patient with labored respirations.  However, able to speak in full sentences. Eyes: Conjunctivae are normal.  Head: Atraumatic. Nose: No congestion/rhinnorhea. Mouth/Throat: Mucous membranes are moist.  Neck: No stridor.   Cardiovascular: Normal rate, regular rhythm. Grossly normal heart sounds.  Respiratory: Labored respirations with prolonged respiratory phase and coarse wheezing throughout. Gastrointestinal: Soft and nontender. No distention. Musculoskeletal: No lower extremity tenderness nor edema.  No joint effusions. Neurologic:  Normal speech and language. No gross focal neurologic deficits are appreciated. Skin:  Skin is warm, dry and intact. No rash noted.  ____________________________________________   LABS (all labs ordered are listed, but only abnormal results are displayed)  Labs Reviewed  COMPREHENSIVE METABOLIC PANEL - Abnormal; Notable for the following components:      Result Value   Sodium 150 (*)    Glucose, Bld 161 (*)    BUN 27 (*)    Albumin 2.9 (*)  AST 63 (*)    ALT 79 (*)    Alkaline Phosphatase 159 (*)    All other components within normal limits  CBC WITH DIFFERENTIAL/PLATELET - Abnormal; Notable for the following components:   WBC 27.3 (*)    RBC 3.79 (*)    Hemoglobin 11.9 (*)    Platelets 652 (*)    Neutro Abs 24.3 (*)    Lymphs Abs 0.5 (*)    Monocytes Absolute 2.0 (*)    Abs Immature Granulocytes 0.35 (*)    All other components within normal limits  TROPONIN I - Abnormal; Notable for the following components:   Troponin I 0.30 (*)    All other components within normal limits  CULTURE,  BLOOD (ROUTINE X 2)  CULTURE, BLOOD (ROUTINE X 2)  MRSA PCR SCREENING  PROTIME-INR  URINALYSIS, COMPLETE (UACMP) WITH MICROSCOPIC  BRAIN NATRIURETIC PEPTIDE  URINALYSIS, ROUTINE W REFLEX MICROSCOPIC  I-STAT CG4 LACTIC ACID, ED  CG4 I-STAT (LACTIC ACID)  I-STAT CG4 LACTIC ACID, ED  I-STAT CG4 LACTIC ACID, ED  I-STAT CG4 LACTIC ACID, ED   ____________________________________________  EKG  ED ECG REPORT I, Arelia Longestavid M Londen Bok, the attending physician, personally viewed and interpreted this ECG.   Date: 09/10/2018  EKG Time: 1348  Rate: 111  Rhythm: sinus tachycardia  Axis: Normal  Intervals:right bundle branch block  ST&T Change: No ST segment elevation.  T wave inversions in V2 as well as V3.  ST depression which is minimal in V3 but confounded by the wandering baseline.  ____________________________________________  RADIOLOGY  Chest x-ray with COPD and patchy reticulonodular densities in the right upper and lower lobes likely reflect superimposed infection or inflammation. ____________________________________________   PROCEDURES  Procedure(s) performed:   .Critical Care Performed by: Myrna BlazerSchaevitz, Louetta Hollingshead Matthew, MD Authorized by: Myrna BlazerSchaevitz, Twanda Stakes Matthew, MD   Critical care provider statement:    Critical care time (minutes):  35   Critical care time was exclusive of:  Separately billable procedures and treating other patients   Critical care was necessary to treat or prevent imminent or life-threatening deterioration of the following conditions:  Respiratory failure and sepsis   Critical care was time spent personally by me on the following activities:  Development of treatment plan with patient or surrogate, discussions with consultants, evaluation of patient's response to treatment, examination of patient, obtaining history from patient or surrogate, ordering and performing treatments and interventions, ordering and review of laboratory studies, ordering and review of  radiographic studies, pulse oximetry, re-evaluation of patient's condition and review of old charts    Critical Care performed:    ____________________________________________   INITIAL IMPRESSION / ASSESSMENT AND PLAN / ED COURSE  Pertinent labs & imaging results that were available during my care of the patient were reviewed by me and considered in my medical decision making (see chart for details).  Differential includes, but is not limited to, viral syndrome, bronchitis including COPD exacerbation, pneumonia, reactive airway disease including asthma, CHF including exacerbation with or without pulmonary/interstitial edema, pneumothorax, ACS, thoracic trauma, and pulmonary embolism. As part of my medical decision making, I reviewed the following data within the electronic MEDICAL RECORD NUMBERPrior outpatient records  ----------------------------------------- 230 PM on 09/10/2018 -----------------------------------------  Reassessed the patient and she has increasingly labored respirations.  Still awake and alert.  Placed on BiPAP.  Sepsis order set initiated and given broad-spectrum antibiotics for multifocal pneumonia.  Patient tolerating BiPAP well.  Will be admitted to the hospital.  Patient understands need for admission.  Signed  out to Dr. Nemiah Commander.  Elevated troponin likely demand related. ____________________________________________   FINAL CLINICAL IMPRESSION(S) / ED DIAGNOSES  Sepsis.  Hypoxia.  COPD exacerbation.  Multifocal pneumonia.  Hypernatremia.  NEW MEDICATIONS STARTED DURING THIS VISIT:  New Prescriptions   No medications on file     Note:  This document was prepared using Dragon voice recognition software and may include unintentional dictation errors.     Myrna Blazer, MD 09/10/18 1611    Myrna Blazer, MD 09/10/18 1614    Ivor Kishi, Myra Rude, MD 09/10/18 [phone]

## 2018-09-10 NOTE — ED Notes (Signed)
Attempted to call report and was advised that nurse would return the call 

## 2018-09-10 NOTE — ED Triage Notes (Signed)
Pt arrived via ems for report of cough, fever and congestion since yesterday - pt has rales throughout and congested cough - pt has alzheimers and is poor historian

## 2018-09-10 NOTE — ED Notes (Signed)
ED TO INPATIENT HANDOFF REPORT  Name/Age/Gender Shelly Duran 77 y.o. female  Code Status    Code Status Orders  (From admission, onward)         Start     Ordered   09/10/18 1548  Full code  Continuous     09/10/18 1547        Code Status History    This patient has a current code status but no historical code status.      Home/SNF/Other [facility] assisted [facility]  Chief Complaint Congestion   Level of Care/Admitting Diagnosis ED Disposition    ED Disposition Condition [place] [place] [[id number]]  Level of Care: Stepdown [14]  Diagnosis: Sepsis [facility]) [[id number]]  Admitting Physician: Gladstone Lighter [[id number]]  Attending Physician: Gladstone Lighter [[id number]]  Estimated length of stay: past midnight tomorrow  Certification:: I certify this patient will need inpatient services for at least 2 midnights  PT Class (Do Not Modify): Inpatient [101]  PT Acc Code (Do Not Modify): Private [1]       Medical History Past Medical History:  Diagnosis Date  . Dementia ([place])     Allergies No Known Allergies  IV Location/Drains/Wounds Patient Lines/Drains/Airways Status   Active Line/Drains/Airways    Name:   Placement date:   Placement time:   Site:   Days:   Peripheral IV 09/10/18 Right Antecubital   09/10/18    1339    Antecubital   less than 1   Peripheral IV 09/10/18 Left Forearm   09/10/18    1407    Forearm   less than 1          Labs/Imaging Results for orders placed or performed during the hospital encounter of 09/10/18 (from the past 48 hour(s))  Comprehensive metabolic panel     Status: Abnormal   Collection Time: 09/10/18  1:49 PM  Result Value Ref Range   Sodium 150 (H) 135 - 145 mmol/L   Potassium 3.7 3.5 - 5.1 mmol/L   Chloride 109 98 - 111 mmol/L   CO2 31 22 - 32 mmol/L   Glucose, Bld 161 (H) 70 - 99 mg/dL   BUN 27 (H) 8 - 23 mg/dL   Creatinine, Ser 0.78 0.44 - 1.00 mg/dL   Calcium 8.9 8.9 -  10.3 mg/dL   Total Protein 6.7 6.5 - 8.1 g/dL   Albumin 2.9 (L) 3.5 - 5.0 g/dL   AST 63 (H) 15 - 41 U/L   ALT 79 (H) 0 - 44 U/L   Alkaline Phosphatase 159 (H) 38 - 126 U/L   Total Bilirubin 0.9 0.3 - 1.2 mg/dL   GFR calc non Af Amer >60 >60 mL/min   GFR calc Af Amer >60 >60 mL/min    Comment: (NOTE) The eGFR has been calculated using the CKD EPI equation. This calculation has not been validated in all clinical situations. eGFR's persistently <60 mL/min signify possible Chronic Kidney Disease.    Anion gap 10 5 - 15    Comment: Performed at [facility], [address] [place], [place] [address]  CBC with Differential     Status: Abnormal   Collection Time: 09/10/18  1:49 PM  Result Value Ref Range   WBC 27.3 (H) 4.0 - 10.5 K/uL   RBC 3.79 (L) 3.87 - 5.11 MIL/uL   Hemoglobin 11.9 (L) 12.0 - 15.0 g/dL   HCT 37.9 36.0 - 46.0 %   MCV 100.0 80.0 - 100.0 fL  MCH 31.4 26.0 - 34.0 pg   MCHC 31.4 30.0 - 36.0 g/dL   RDW 13.6 11.5 - 15.5 %   Platelets 652 (H) 150 - 400 K/uL   nRBC 0.0 0.0 - 0.2 %   Neutrophils Relative % 90 %   Neutro Abs 24.3 (H) 1.7 - 7.7 K/uL   Lymphocytes Relative 2 %   Lymphs Abs 0.5 (L) 0.7 - 4.0 K/uL   Monocytes Relative 7 %   Monocytes Absolute 2.0 (H) 0.1 - 1.0 K/uL   Eosinophils Relative 0 %   Eosinophils Absolute 0.1 0.0 - 0.5 K/uL   Basophils Relative 0 %   Basophils Absolute 0.1 0.0 - 0.1 K/uL   Immature Granulocytes 1 %   Abs Immature Granulocytes 0.35 (H) 0.00 - 0.07 K/uL    Comment: Performed at [facility], [address] [place], [place] [address]  Protime-INR     Status: None   Collection Time: 09/10/18  1:49 PM  Result Value Ref Range   Prothrombin Time 14.3 11.4 - 15.2 seconds   INR 1.12     Comment: Performed at [facility], [address] [place], [place] [address]  Troponin I - ONCE - STAT     Status: Abnormal   Collection Time: 09/10/18  1:49 PM  Result Value Ref Range   Troponin I 0.30 (HH) <0.03  ng/mL    Comment: CRITICAL RESULT CALLED TO, READ BACK BY AND VERIFIED WITH   AT 1441 09/10/18.PMH Performed at [facility], [address] [place], [place] [address]   Brain natriuretic peptide     Status: Abnormal   Collection Time: 09/10/18  1:49 PM  Result Value Ref Range   B Natriuretic Peptide 314.0 (H) 0.0 - 100.0 pg/mL    Comment: Performed at [facility], [address] , [place] [address]  CG4 I-STAT (Lactic acid)     Status: None   Collection Time: 09/10/18  1:54 PM  Result Value Ref Range   Lactic Acid, Venous 1.69 0.5 - 1.9 mmol/L  CG4 I-STAT (Lactic acid)     Status: None   Collection Time: 09/10/18  3:57 PM  Result Value Ref Range   Lactic Acid, Venous 1.90 0.5 - 1.9 mmol/L   Dg Chest Portable 1 View  Result Date: 09/10/2018 CLINICAL DATA:  Cough and fever. EXAM: PORTABLE CHEST 1 VIEW COMPARISON:  None. FINDINGS: The heart size and mediastinal contours are within normal limits. Normal pulmonary vascularity. The lungs are hyperinflated with emphysematous changes and coarsened interstitial markings. Patchy reticulonodular densities in the right upper and lower lobes. No pleural effusion or pneumothorax. No acute osseous abnormality. IMPRESSION: 1. COPD. Patchy reticulonodular densities in the right upper and lower lobes likely reflect superimposed infection or inflammation. Followup to resolution is recommended. Electronically Signed   By: William T Derry M.D.   On: 09/10/2018 14:32    Pending Labs Unresulted Labs (From admission, onward)    Start     Ordered   09/11/18 0500  Basic metabolic panel  Tomorrow morning,   STAT     09/10/18 1547   09/11/18 0500  CBC  Tomorrow morning,   STAT     09/10/18 1547   09/10/18 1554  Urinalysis, Complete w Microscopic  Once,   STAT     09/10/18 1553   09/10/18 1449  MRSA PCR Screening  ONCE - STAT,   STAT     09/10/18 1448   09/10/18 1443  Urinalysis, Routine w   reflex microscopic  ONCE -  STAT,   STAT     09/10/18 1443   09/10/18 1347  Culture, blood (Routine x 2)  BLOOD CULTURE X 2,   STAT     09/10/18 1348   Signed and Held  Troponin I - Now Then Q6H  Now then every 6 hours,   R     Signed and Held          Vitals/Pain Today's Vitals   09/10/18 1500 09/10/18 1503 09/10/18 1530 09/10/18 1600  BP: 138/89  134/81 114/63  Pulse: 97 (!) 104 (!) 103 (!) 36  Resp: 17 19 20 20  Temp:      TempSrc:      SpO2: 95% 93% 98% 99%  Weight:      Height:      PainSc:        Isolation Precautions No active isolations  Medications Medications  vancomycin (VANCOCIN) IVPB 1000 mg/200 mL premix (1,000 mg Intravenous Transfusing/Transfer 09/10/18 1617)  methimazole (TAPAZOLE) tablet 5 mg (has no administration in time range)  risperiDONE (RISPERDAL) tablet 1 mg (has no administration in time range)  enoxaparin (LOVENOX) injection 30 mg (has no administration in time range)  dextrose 5 %-0.45 % sodium chloride infusion (has no administration in time range)  acetaminophen (TYLENOL) tablet 650 mg (has no administration in time range)    Or  acetaminophen (TYLENOL) suppository 650 mg (has no administration in time range)  ondansetron (ZOFRAN) tablet 4 mg (has no administration in time range)    Or  ondansetron (ZOFRAN) injection 4 mg (has no administration in time range)  chlorhexidine (PERIDEX) 0.12 % solution 15 mL (has no administration in time range)  MEDLINE mouth rinse (has no administration in time range)  albuterol (PROVENTIL) (2.5 MG/3ML) 0.083% nebulizer solution 2.5 mg (2.5 mg Nebulization Given 09/10/18 1413)  ipratropium-albuterol (DUONEB) 0.5-2.5 (3) MG/3ML nebulizer solution 6 mL (6 mLs Nebulization Given 09/10/18 1457)  methylPREDNISolone sodium succinate (SOLU-MEDROL) 125 mg/2 mL injection 125 mg (125 mg Intravenous Given 09/10/18 1447)  ceFEPIme (MAXIPIME) 2 g in sodium chloride 0.9 % 100 mL IVPB ( Intravenous Stopped 09/10/18 1522)  albuterol (PROVENTIL) (2.5  MG/3ML) 0.083% nebulizer solution 7.5 mg (7.5 mg Nebulization Given 09/10/18 1519)  sodium chloride 0.9 % bolus 1,000 mL (1,000 mLs Intravenous Transfusing/Transfer 09/10/18 1616)    Mobility non-ambulatory  

## 2018-09-11 ENCOUNTER — Inpatient Hospital Stay (HOSPITAL_COMMUNITY)
Admit: 2018-09-11 | Discharge: 2018-09-11 | Disposition: A | Discharge status: Home / Self Care | Payer: Medicare Other | Attending: Internal Medicine | Admitting: Internal Medicine

## 2018-09-11 DIAGNOSIS — J441 Chronic obstructive pulmonary disease with (acute) exacerbation: Secondary | ICD-10-CM

## 2018-09-11 DIAGNOSIS — I351 Nonrheumatic aortic (valve) insufficiency: Secondary | ICD-10-CM

## 2018-09-11 DIAGNOSIS — J9600 Acute respiratory failure, unspecified whether with hypoxia or hypercapnia: Secondary | ICD-10-CM

## 2018-09-11 DIAGNOSIS — J188 Other pneumonia, unspecified organism: Secondary | ICD-10-CM

## 2018-09-11 DIAGNOSIS — I340 Nonrheumatic mitral (valve) insufficiency: Secondary | ICD-10-CM

## 2018-09-11 DIAGNOSIS — I361 Nonrheumatic tricuspid (valve) insufficiency: Secondary | ICD-10-CM

## 2018-09-11 DIAGNOSIS — Z515 Encounter for palliative care: Secondary | ICD-10-CM

## 2018-09-11 DIAGNOSIS — J189 Pneumonia, unspecified organism: Secondary | ICD-10-CM

## 2018-09-11 LAB — GLUCOSE, CAPILLARY
GLUCOSE-CAPILLARY: 158 mg/dL — AB (ref 70–99)
GLUCOSE-CAPILLARY: 162 mg/dL — AB (ref 70–99)

## 2018-09-11 LAB — BASIC METABOLIC PANEL
Anion gap: 7 (ref 5–15)
BUN: 25 mg/dL — AB (ref 8–23)
CHLORIDE: 113 mmol/L — AB (ref 98–111)
CO2: 28 mmol/L (ref 22–32)
CREATININE: 0.72 mg/dL (ref 0.44–1.00)
Calcium: 8.3 mg/dL — ABNORMAL LOW (ref 8.9–10.3)
GFR calc Af Amer: >60 mL/min (ref >60)
GFR calc non Af Amer: >60 mL/min (ref >60)
Glucose, Bld: 248 mg/dL — ABNORMAL HIGH (ref 70–99)
Potassium: 3 mmol/L — ABNORMAL LOW (ref 3.5–5.1)
Sodium: 148 mmol/L — ABNORMAL HIGH (ref 135–145)

## 2018-09-11 LAB — TROPONIN I
Troponin I: 0.15 ng/mL (ref <0.03)
Troponin I: 0.15 ng/mL (ref <0.03)
Troponin I: 0.15 ng/mL (ref <0.03)

## 2018-09-11 LAB — CBC
HEMATOCRIT: 33.9 % — AB (ref 36.0–46.0)
Hemoglobin: 10.4 g/dL — ABNORMAL LOW (ref 12.0–15.0)
MCH: 31 pg (ref 26.0–34.0)
MCHC: 30.7 g/dL (ref 30.0–36.0)
MCV: 100.9 fL — AB (ref 80.0–100.0)
Platelets: 591 10*3/uL — ABNORMAL HIGH (ref 150–400)
RBC: 3.36 MIL/uL — ABNORMAL LOW (ref 3.87–5.11)
RDW: 13.6 % (ref 11.5–15.5)
WBC: 25.8 10*3/uL — ABNORMAL HIGH (ref 4.0–10.5)
nRBC: 0 % (ref 0.0–0.2)

## 2018-09-11 LAB — PHOSPHORUS: PHOSPHORUS: 3.1 mg/dL (ref 2.5–4.6)

## 2018-09-11 LAB — MAGNESIUM: Magnesium: 2.1 mg/dL (ref 1.7–2.4)

## 2018-09-11 MED ORDER — SODIUM CHLORIDE 0.9 % IV SOLN
Freq: Once | INTRAVENOUS | Status: AC
  Administered 2018-09-11: 04:00:00 via INTRAVENOUS

## 2018-09-11 MED ORDER — FAMOTIDINE 20 MG PO TABS
20.0000 mg | ORAL_TABLET | Freq: Every day | ORAL | Status: DC
  Administered 2018-09-11 – 2018-09-14 (×4): 20 mg via ORAL
  Filled 2018-09-11 (×4): qty 1

## 2018-09-11 MED ORDER — GUAIFENESIN ER 600 MG PO TB12
600.0000 mg | ORAL_TABLET | Freq: Two times a day (BID) | ORAL | Status: DC
  Administered 2018-09-12 – 2018-09-14 (×4): 600 mg via ORAL
  Filled 2018-09-11 (×5): qty 1

## 2018-09-11 MED ORDER — INSULIN ASPART 100 UNIT/ML ~~LOC~~ SOLN: 0.0000 [IU] | Freq: Every day | SUBCUTANEOUS | Status: DC

## 2018-09-11 MED ORDER — POTASSIUM CHLORIDE 20 MEQ PO PACK: 60.0000 meq | PACK | Freq: Once | ORAL | Status: DC

## 2018-09-11 MED ORDER — INSULIN ASPART 100 UNIT/ML ~~LOC~~ SOLN
0.0000 [IU] | Freq: Three times a day (TID) | SUBCUTANEOUS | Status: DC
  Administered 2018-09-11 – 2018-09-13 (×5): 2 [IU] via SUBCUTANEOUS
  Administered 2018-09-13: 1 [IU] via SUBCUTANEOUS
  Filled 2018-09-11 (×6): qty 1

## 2018-09-11 MED ORDER — ASPIRIN EC 81 MG PO TBEC
81.0000 mg | DELAYED_RELEASE_TABLET | Freq: Every day | ORAL | Status: DC
  Administered 2018-09-11 – 2018-09-13 (×3): 81 mg via ORAL
  Filled 2018-09-11 (×3): qty 1

## 2018-09-11 MED ORDER — POTASSIUM CHLORIDE 10 MEQ/100ML IV SOLN
10.0000 meq | INTRAVENOUS | Status: AC
  Administered 2018-09-11 (×6): 10 meq via INTRAVENOUS
  Filled 2018-09-11 (×6): qty 100

## 2018-09-11 NOTE — Progress Notes (Signed)
   Name: Shelly NorrieDiane Chasse MRN: [id number] DOB: 09/20/1941     CONSULTATION DATE: 09/10/2018  Subjective & objectives: Off BiPAP and tolerating nasal cannula.  PAST MEDICAL HISTORY :   has a past medical history of Dementia (HCC).  has no past surgical history on file. Prior to Admission medications   Medication Sig Start Date End Date Taking? Authorizing Provider  risperiDONE (RISPERDAL) 1 MG tablet Take 1 tablet by mouth. Take 1 tab at bedtime. May repeat after 1 hour if needed for agitation. 08/18/18  Yes [provider]   No Known Allergies  FAMILY HISTORY:  Family history is unknown by patient. SOCIAL HISTORY:  reports that she has been smoking. She has never used smokeless tobacco. She reports that she does not drink alcohol or use drugs.  REVIEW OF SYSTEMS:   Unable to obtain due to critical illness   VITAL SIGNS: Temp:  [98.2 F (36.8 C)-98.9 F (37.2 C)] 98.3 F (36.8 C) (11/18 0800) Pulse Rate:  [25-119] 104 (11/18 1100) Resp:  [16-27] 21 (11/18 1200) BP: (107-151)/(63-96) 107/92 (11/18 1200) SpO2:  [91 %-99 %] 95 % (11/18 1100) FiO2 (%):  [30 %] 30 % (11/18 0800) Weight:  [40.7 kg] 40.7 kg (11/17 1652)  Physical Examination:  Somnolent, arousable, confused, moving all extremities however not following commands.  Baseline advanced dementia Tolerating nasal cannula, no distress, able to talk in full sentences, bilateral equal air entry and right basal posterior medium crackles S1 & S2 are audible.  No murmur Benign abdominal exam Wasted extremities with no edema   ASSESSMENT / PLAN: Acute on chronic respiratory failure with baseline finding consistent with advanced COPD.  Tolerating of BiPAP on nasal cannula -Monitor work of breathing, O2 sat and ABG  Acute exacerbation of COPD -Bronchodilators + inhaled steroids + systemic steroids + antimicrobials  Pneumonia.  Bibasilar airspace disease Rt> Lt. MRSA PCR negative -C/W cefepime, S/P vancomycin.   Monitor CXR + CBC + FiO2  Mild elevation of troponin, currently trending down.  No acute specific ST segment changes on EKG. -Monitor echocardiogram and consider cardiology consult  Prerenal azotemia, hyponatremia and hyperchloremia with dehydration and intravascular volume depletion. -Optimize hydration, monitor renal panel and urine output.  Elevated LFTs. -Optimize hydration, avoid hepatotoxins, monitor LFTs and consider liver ultrasound if no improvement  Hypothyroidism -Optimize methimazole and monitor thyroid function.  Dysphagia level 1 -Pured diet with nectar consistency liquids  Anemia -Keep hemoglobin more than 7 g/dL  Reactive thrombocytosis -Monitor platelets  Alzheimer dementia -Supportive care  Full code  DVT & GI prophylaxis.  Continue with supportive care  Critical care time 40 minutes

## 2018-09-11 NOTE — Plan of Care (Signed)
Patient remains on BiPap.  Alert.  Pleasantly confused.  No acute concerns at this time. Episodes of tachycardia. NP aware.  Bolus given. Will continue to monitor.

## 2018-09-11 NOTE — Evaluation (Signed)
Clinical/Bedside Swallow Evaluation Patient Details  Name: Shelly Duran MRN: [id number] Date of Birth: 05/14/1941  Today's Date: 09/11/2018 Time: SLP Start Time (ACUTE ONLY): 0955 SLP Stop Time (ACUTE ONLY): 1055 SLP Time Calculation (min) (ACUTE ONLY): 60 min  Past Medical History:  Past Medical History:  Diagnosis Date  . Dementia [facility])    Past Surgical History: History reviewed. No pertinent surgical history. HPI:  Pt is a 77 y.o. female with a known history of moderate Alzheimer's dementia (noted in chart notes since 2015)) who just moved into an assisted living facility (~1 month ago per chart notes) brought in secondary to worsening cough, fever and congestion since yesterday. Due to her underlying dementia, patient is a very poor historian.  She has seen her PCP last month and was noted to be very malnourished and family concerned that she is forgetting to eat.  Patient is followed by social workers and adult protective services.  Due to these concerns, patient is more to assisted living facility.  Most of the history is obtained from the ER physician and the charts.  Apparently she has been coughing and was running a low-grade fever since yesterday.  She sounds pretty hoarse in the ED today and chest x-ray confirming multifocal pneumonia.  She has elevated white count and is noted to be dyspneic and tachycardic.  She was started on BiPAP for her breathing.  This morning, pt is asking about the "2 boys" playing in the room - she is referring to her Sons per her report.  She requires Mod verbal cues to attend to tasks during swallowing evaluation and does NOT initiate self-feeding or the awareness of such.    Assessment / Plan / Recommendation Clinical Impression  Pt appeared to present w/ Moderate oropharyngeal phase dysphagia imacted by her declined Cognitive status and overall awareness during engagement w/ po tasks. Pt required Moderate verbal/tactile cues (spoon touching lips) to cue pt  to open mouth. Pt often kept her eyes closed though she responded to basic questions w/ slightly mumbled speech - pt does have a baseline of Moderate Dementia. During the oropharyngeal phase of swallowing, pt exhibited reduced lingual coordination and poor oral awareness w/ oral holding and delayed A-P transfer. During the pharyngeal phase, pt exhibited coughing w/ trial of ice chips but appeared to adequately manage the trials of Puree and NECTAR consistency liquids via tsp and straw w/ no overt coughing or decline in O2 sats(95-97%). Suspect a delayed pharyngeal swallow initiation w/ increased risk for aspiration overall d/t Cognitive decline. During the oral phase, pt required tactile and verbal stim w/ spoon to lips w/ oral holding noted but once pt swallowed, she appeared to clear the oral cavity adequately upon checking. OM Exam revealed no gross unilateral weakness or decreased tone. Pt did not initiate any self-feeding. Recommend a Dysphagia level 1 (PUREE) w/ NECTAR liquids diet w/ strict aspiration precautions; feeding support and monitoring giving verbal/tactile cues as needed; Pills in puree for safer swallowing. ST services will f/u w/ toleration of diet and trials to upgrade diet as appropriate. NSG and MD updated.  SLP Visit Diagnosis: Dysphagia, oropharyngeal phase (R13.12)    Aspiration Risk  Moderate aspiration risk;Risk for inadequate nutrition/hydration    Diet Recommendation  Dysphagia level 1(PUREE) w/ NECTAR consistency liquids; aspiration precautions; feeding support and monitoring   Medication Administration: Crushed with puree(as able for safer swallowing)    Other  Recommendations Recommended Consults: (Dietician f/u; Palliative Care consult for GOC) Oral Care Recommendations: Oral care  BID;Staff/trained caregiver to provide oral care Other Recommendations: Order thickener from pharmacy;Prohibited food (jello, ice cream, thin soups);Remove water pitcher;Have oral suction  available   Follow up Recommendations Skilled Nursing facility(TBD)      Frequency and Duration min 3x week  2 weeks       Prognosis Prognosis for Safe Diet Advancement: Fair Barriers to Reach Goals: Cognitive deficits;Severity of deficits      Swallow Study   General Date of Onset: 09/10/18 HPI: Pt is a 77 y.o. female with a known history of moderate Alzheimer's dementia (noted in chart notes since 2015)) who just moved into an assisted living facility (~1 month ago per chart notes) brought in secondary to worsening cough, fever and congestion since yesterday. Due to her underlying dementia, patient is a very poor historian.  She has seen her PCP last month and was noted to be very malnourished and family concerned that she is forgetting to eat.  Patient is followed by social workers and adult protective services.  Due to these concerns, patient is more to assisted living facility.  Most of the history is obtained from the ER physician and the charts.  Apparently she has been coughing and was running a low-grade fever since yesterday.  She sounds pretty hoarse in the ED today and chest x-ray confirming multifocal pneumonia.  She has elevated white count and is noted to be dyspneic and tachycardic.  She was started on BiPAP for her breathing.  This morning, pt is asking about the "2 boys" playing in the room - she is referring to her Sons per her report.  She requires Mod verbal cues to attend to tasks during swallowing evaluation and does NOT initiate self-feeding or the awareness of such.  Type of Study: Bedside Swallow Evaluation Previous Swallow Assessment: none reported Diet Prior to this Study: NPO Temperature Spikes Noted: No(wbc down to 25.8 from 27.3) Respiratory Status: Nasal cannula(3 liters) History of Recent Intubation: No(use of BIPAP) Behavior/Cognition: Cooperative;Confused;Pleasant mood;Distractible;Requires cueing(Awake but eyes closed often; mumbled speech) Oral Cavity  Assessment: Within Functional Limits Oral Care Completed by SLP: Recent completion by staff Oral Cavity - Dentition: Missing dentition(no dentures) Vision: (n/a) Self-Feeding Abilities: Total assist(did not initiate) Patient Positioning: Upright in bed(needed positioning) Baseline Vocal Quality: Normal(mumbled speech) Volitional Cough: Cognitively unable to elicit Volitional Swallow: Unable to elicit    Oral/Motor/Sensory Function Overall Oral Motor/Sensory Function: Within functional limits(appeared so w/ bolus management and clearing)   Ice Chips Ice chips: Impaired Presentation: Spoon(fed; 3 trials) Oral Phase Impairments: Poor awareness of bolus;Reduced lingual movement/coordination Oral Phase Functional Implications: Oral holding;Prolonged oral transit Pharyngeal Phase Impairments: Suspected delayed Swallow;Cough - Immediate(x1/3) Other Comments: decreased awareness and increased effort during initiation of the swallowing - lingual pumping(?)   Thin Liquid Thin Liquid: Not tested    Nectar Thick Nectar Thick Liquid: Impaired Presentation: Spoon(fed; 8 trials) Oral Phase Impairments: Poor awareness of bolus;Reduced lingual movement/coordination Oral phase functional implications: Prolonged oral transit;Oral holding Pharyngeal Phase Impairments: Suspected delayed Swallow Other Comments: min decreased awareness and increased effort during initiation of the swallowing - lingual pumping(?)   Honey Thick Honey Thick Liquid: Not tested   Puree Puree: Impaired Presentation: Spoon(fed; ~2 ozs) Oral Phase Impairments: Reduced lingual movement/coordination;Poor awareness of bolus Oral Phase Functional Implications: Prolonged oral transit;Oral holding Pharyngeal Phase Impairments: Suspected delayed Swallow Other Comments: decreased awareness and increased effort during initiation of the swallowing - lingual pumping(?)   Solid     Solid: Not tested  Jerilynn SomKatherine Watson, MS,  CCC-SLP Watson,Katherine 09/11/2018,2:37 PM

## 2018-09-11 NOTE — Care Management Note (Signed)
Case Management Note  Patient Details  Name: Shelly Duran MRN: [id number] Date of Birth: 04/08/1941  Subjective/Objective:        Patient admitted with Sepsis.  Patient is from [facility], Shelly Duran, Director of [facility], at the bedside this morning.  [name] number is [phone].  Shelly Duran reports that the patient was referred by APS and has been at the facility for 2 months.  Referred by APS for self neglect- patient has 2 sons but one lives in [place] and the other in [place].  Shelly Duran has been in contact with the son's.  Shelly Duran reports that [facility] is holding a bed for the patient, she reports that they have 24/7 staff and care and only 12 residents.  Patient did not require assistive devices for mobility before admission- she has a history of dementia.   CSW is following the case- refer to previous notes.    Shelly LisJeanna Skilynn Durney RN BSN [phone]               Action/Plan:   Expected Discharge Date:                  Expected Discharge Plan:  Assisted Living / Rest Home  In-House Referral:  Clinical Social Work  Discharge planning Services  CM Consult  Post Acute Care Choice:    Choice offered to:     DME Arranged:    DME Agency:     HH Arranged:    HH Agency:     Status of Service:  In process, will continue to follow  If discussed at Long Length of Stay Meetings, dates discussed:    Additional Comments:  Shelly ButcherJeanna M Marke Goodwyn, RN 09/11/2018, 10:58 AM

## 2018-09-11 NOTE — Progress Notes (Signed)
*  PRELIMINARY RESULTS* Echocardiogram 2D Echocardiogram has been performed.  Cristela BlueHege, Kiala Faraj 09/11/2018, 2:29 PM

## 2018-09-11 NOTE — Consult Note (Signed)
Palliative Medicine [facility]  [phone] [phone]   Name: Shelly Duran Date: 09/11/2018 MRN: [id number]  DOB: Oct 25, 1941  Patient Care Team: Patient, No Pcp Per as PCP - General (General Practice)    REASON FOR CONSULTATION: Palliative Care consult requested for this 77 y.o. female with multiple medical problems including Alzheimer's dementia, who presented from the ALF with .    SOCIAL HISTORY:    Patient is recently widowed after losing her husband in October 2019.  She also recently moved to [facility] assisted [facility] facility in October 2019.  Patient has two sons, one of whom lives in [place] and the other in [place]. ADVANCE DIRECTIVES:  Patient reportedly has a living will Son is Shelly Duran is her [place] [phone]) Other son is Shelly Duran [phone]) Wonda Olds is Shelly Duran [phone]  CODE STATUS: DNR  PAST MEDICAL HISTORY: Past Medical History:  Diagnosis Date  . Dementia (HCC)     PAST SURGICAL HISTORY: History reviewed. No pertinent surgical history.  HEMATOLOGY/ONCOLOGY HISTORY:   No history exists.    ALLERGIES:  has No Known Allergies.  MEDICATIONS:  Current Facility-Administered Medications  Medication Dose Route Frequency Provider Last Rate Last Dose  . acetaminophen (TYLENOL) tablet 650 mg  650 mg Oral Q6H PRN Enid Baas, MD       Or  . acetaminophen (TYLENOL) suppository 650 mg  650 mg Rectal Q6H PRN Enid Baas, MD      . aspirin EC tablet 81 mg  81 mg Oral Daily Duanne Limerick, Maged, MD   81 mg at 09/11/18 1556  . budesonide (PULMICORT) nebulizer solution 0.5 mg  0.5 mg Nebulization BID Erin Fulling, MD   0.5 mg at 09/11/18 0732  . ceFEPIme (MAXIPIME) 2 g in sodium chloride 0.9 % 100 mL IVPB  2 g Intravenous Q12H Ronnald Ramp, RPH 200 mL/hr at 09/11/18 1528 2 g at 09/11/18 1528  . chlorhexidine (PERIDEX) 0.12 % solution 15 mL  15 mL Mouth Rinse BID Erin Fulling, MD   15  mL at 09/10/18 2200  . dextrose 5 %-0.45 % sodium chloride infusion   Intravenous Continuous Enid Baas, MD 75 mL/hr at 09/11/18 0800    . enoxaparin (LOVENOX) injection 30 mg  30 mg Subcutaneous Q24H Ellington, Abby K, RPH   30 mg at 09/10/18 1725  . famotidine (PEPCID) tablet 20 mg  20 mg Oral Daily Samaan, Maged, MD   20 mg at 09/11/18 1556  . guaiFENesin (MUCINEX) 12 hr tablet 600 mg  600 mg Oral BID Enid Baas, MD      . insulin aspart (novoLOG) injection 0-5 Units  0-5 Units Subcutaneous QHS Samaan, Maged, MD      . insulin aspart (novoLOG) injection 0-9 Units  0-9 Units Subcutaneous TID WC Samaan, Maged, MD      . ipratropium-albuterol (DUONEB) 0.5-2.5 (3) MG/3ML nebulizer solution 3 mL  3 mL Nebulization Q4H Erin Fulling, MD   3 mL at 09/11/18 1505  . MEDLINE mouth rinse  15 mL Mouth Rinse q12n4p Erin Fulling, MD   15 mL at 09/11/18 1557  . methimazole (TAPAZOLE) tablet 5 mg  5 mg Oral Daily Enid Baas, MD      . methylPREDNISolone sodium succinate (SOLU-MEDROL) 40 mg/mL injection 40 mg  40 mg Intravenous Q12H Erin Fulling, MD   40 mg at 09/11/18 0517  . ondansetron (ZOFRAN) tablet 4 mg  4 mg Oral Q6H PRN Enid Baas, MD  Or  . ondansetron (ZOFRAN) injection 4 mg  4 mg Intravenous Q6H PRN Enid Baas, MD      . risperiDONE (RISPERDAL) tablet 1 mg  1 mg Oral BID Enid Baas, MD        VITAL SIGNS: BP 115/76   Pulse (!) 104   Temp 98.3 F (36.8 C) (Axillary)   Resp 17   Ht 4\' 10"  (1.473 m)   Wt 89 lb 11.6 oz (40.7 kg)   SpO2 95%   BMI 18.75 kg/m  Filed Weights   09/10/18 1343 09/10/18 1652  Weight: 100 lb (45.4 kg) 89 lb 11.6 oz (40.7 kg)    Estimated body mass index is 18.75 kg/m as calculated from the following:   Height as of this encounter: 4\' 10"  (1.473 m).   Weight as of this encounter: 89 lb 11.6 oz (40.7 kg).  LABS: CBC:    Component Value Date/Time   WBC 25.8 (H) 09/11/2018 0404   HGB 10.4 (L) 09/11/2018 0404     HCT 33.9 (L) 09/11/2018 0404   PLT 591 (H) 09/11/2018 0404   MCV 100.9 (H) 09/11/2018 0404   NEUTROABS 24.3 (H) 09/10/2018 1349   LYMPHSABS 0.5 (L) 09/10/2018 1349   MONOABS 2.0 (H) 09/10/2018 1349   EOSABS 0.1 09/10/2018 1349   BASOSABS 0.1 09/10/2018 1349   Comprehensive Metabolic Panel:    Component Value Date/Time   NA 148 (H) 09/11/2018 0404   K 3.0 (L) 09/11/2018 0404   CL 113 (H) 09/11/2018 0404   CO2 28 09/11/2018 0404   BUN 25 (H) 09/11/2018 0404   CREATININE 0.72 09/11/2018 0404   GLUCOSE 248 (H) 09/11/2018 0404   CALCIUM 8.3 (L) 09/11/2018 0404   AST 63 (H) 09/10/2018 1349   ALT 79 (H) 09/10/2018 1349   ALKPHOS 159 (H) 09/10/2018 1349   BILITOT 0.9 09/10/2018 1349   PROT 6.7 09/10/2018 1349   ALBUMIN 2.9 (L) 09/10/2018 1349    RADIOGRAPHIC STUDIES: Dg Chest Portable 1 View  Result Date: 09/10/2018 CLINICAL DATA:  Cough and fever. EXAM: PORTABLE CHEST 1 VIEW COMPARISON:  None. FINDINGS: The heart size and mediastinal contours are within normal limits. Normal pulmonary vascularity. The lungs are hyperinflated with emphysematous changes and coarsened interstitial markings. Patchy reticulonodular densities in the right upper and lower lobes. No pleural effusion or pneumothorax. No acute osseous abnormality. IMPRESSION: 1. COPD. Patchy reticulonodular densities in the right upper and lower lobes likely reflect superimposed infection or inflammation. Followup to resolution is recommended. Electronically Signed   By: Obie Dredge M.D.   On: 09/10/2018 14:32    PERFORMANCE STATUS (ECOG) : 2 - Symptomatic, <50% confined to bed  Review of Systems  Unable to perform ROS  As noted above. Otherwise, a complete review of systems is negative.  Physical Exam General: Ill-appearing Cardiovascular: regular rate and rhythm Pulmonary: clear ant fields Abdomen: soft, nontender, + bowel sounds Extremities: no edema Skin: no rashes Neurological: Poor mentation,  confused  IMPRESSION: Patient pleasantly confused.  She remains in ICU but is now off BiPAP.  I called and spoke with Shelly Duran, the supervisor at patient's assisted living facility.  Shelly Duran's phone number is [phone].  Shelly Duran says that patient has been living in the memory care unit since 08/18/2018.  Prior to that patient was living at home and had been declining for an unknown period of time.  Patient apparently had not been eating or bathing.  APS was involved and sons were contacted and patient was moved to  ALF.  [place].  Shelly NixonJanice says that since moving to the ALF the patient has been doing quite well.  She apparently has been eating well and gaining weight.  At baseline, she is ambulatory without assistance and feeds herself.  I then called and spoke with patient's son, Lenon OmsDavid Duran at [phone].  Shelly HuaDavid was not aware that patient was in the hospital.  I updated him on patient's current medical problems.  He recognizes that patient remains tenuous.  We discussed the possibility that patient might not return to her previous baseline as a pneumonia can be a significant medical problem with patient's underlying age and dementia.  We discussed CODE STATUS.  He says that patient has a living will.  He does not feel that patient would want nor should have her life prolonged artificially or to be resuscitated.  I explained in detail the nature of a DO NOT RESUSCITATE order and he expressed clearly that that is what he desired for his mother.  We will follow clinically.  I discussed with son the option of hospice involvement should it become apparent that she is declining or will not return to her previous baseline.  Son says he would agree with hospice care if indicated.  PLAN: Continue medical treatment DNR We will follow   Time Total: 75 minutes  Visit consisted of counseling and education dealing with the complex and emotionally intense issues of symptom  management and palliative care in the setting of serious and potentially life-threatening illness.Greater than 50%  of this time was spent counseling and coordinating care related to the above assessment and plan.  Signed by: Laurette SchimkeJoshua , PhD, DNP, NP-C, [facility] [phone] (Work Cell)

## 2018-09-11 NOTE — Progress Notes (Signed)
Pharmacy Antibiotic Note  Shelly Duran is a 77 y.o. female admitted on 09/10/2018 with pneumonia.  Pharmacy has been consulted for cefepime and vancomycin dosing. 77 yo very thin and frail WF with acute and severe resp failure with COPD exacerbation and multifocal pneumonia on biPAP.   Ke: 0.038, t1/2: 18.24, Vd 31.78.   Plan: MRSA PCR is negative, Vancomycin discontinued.  Will continue cefepime 2 g q12H per renal function.  Height: 4\' 10"  (147.3 cm) Weight: 89 lb 11.6 oz (40.7 kg) IBW/kg (Calculated) : 40.9  Temp (24hrs), Avg:98.6 F (37 C), Min:98.2 F (36.8 C), Max:98.9 F (37.2 C)  Recent Labs  Lab 09/10/18 1349 09/10/18 1354 09/10/18 1557 09/11/18 0404  WBC 27.3*  --   --  25.8*  CREATININE 0.78  --   --  0.72  LATICACIDVEN  --  1.69 1.90  --     Estimated Creatinine Clearance: 39 mL/min (by C-G formula based on SCr of 0.72 mg/dL).    No Known Allergies  Antimicrobials this admission: 11/17 cefepime >>  11/17 vancomycin >> 11/18  Dose adjustments this admission: Cefepime 2 g q12H per renal function (CrCl: 30-3150ml/min)  Microbiology results: 11/17 BCx: pending  11/17 MRSA PCR: negative  Thank you for allowing pharmacy to be a part of this patient's care.  Clovia CuffLisa Tandra Rosado, PharmD, BCPS 09/11/2018 2:21 PM

## 2018-09-11 NOTE — Progress Notes (Signed)
[employer] [facility] [facility] [place] at [facility]   PATIENT NAME: Shelly Duran    MR#:  [id number]  DATE OF BIRTH:  12/09/1940  SUBJECTIVE:  CHIEF COMPLAINT:   Chief Complaint  Patient presents with  . Cough  . Shortness of Breath   -Admitted from assisted living with fever cough and congestion. -Remains on BiPAP this morning.  Still has congested cough.  Tachypnea is improving  REVIEW OF SYSTEMS:  Review of Systems  Unable to perform ROS: Dementia    DRUG ALLERGIES:  No Known Allergies  VITALS:  Blood pressure (!) 143/90, pulse (!) 119, temperature 98.2 F (36.8 C), temperature source Axillary, resp. rate (!) 22, height 4\' 10"  (1.473 m), weight 40.7 kg, SpO2 98 %.  PHYSICAL EXAMINATION:  Physical Exam  GENERAL:  77 y.o.-year-old patient lying in the bed with no acute distress.  Severely malnourished EYES: Pupils equal, round, reactive to light and accommodation. No scleral icterus. Extraocular muscles intact.  HEENT: Head atraumatic, normocephalic. Oropharynx and nasopharynx clear.  NECK:  Supple, no jugular venous distention. No thyroid enlargement, no tenderness.  LUNGS: Normal breath sounds bilaterally, no wheezing, coarse rhonchi bilaterally, no rales  or crepitation. No use of accessory muscles of respiration.  CARDIOVASCULAR: S1, S2 normal. No  rubs, or gallops. 3/6 systolic murmur present ABDOMEN: Soft, nontender, nondistended. Bowel sounds present. No organomegaly or mass.  EXTREMITIES: No pedal edema, cyanosis, or clubbing.  NEUROLOGIC: Cranial nerves II through XII are intact. Muscle strength 5/5 in all extremities. Sensation intact. Gait not checked.  PSYCHIATRIC: The patient is alert and oriented to self.  SKIN: No obvious rash, lesion, or ulcer.    LABORATORY PANEL:   CBC Recent Labs  Lab 09/11/18 0404  WBC 25.8*  HGB 10.4*  HCT 33.9*  PLT 591*    ------------------------------------------------------------------------------------------------------------------  Chemistries  Recent Labs  Lab 09/10/18 1349  09/11/18 0404  NA 150*  --  148*  K 3.7  --  3.0*  CL 109  --  113*  CO2 31  --  28  GLUCOSE 161*  --  248*  BUN 27*  --  25*  CREATININE 0.78  --  0.72  CALCIUM 8.9  --  8.3*  MG  --    < > 2.1  AST 63*  --   --   ALT 79*  --   --   ALKPHOS 159*  --   --   BILITOT 0.9  --   --    < > = values in this interval not displayed.   ------------------------------------------------------------------------------------------------------------------  Cardiac Enzymes Recent Labs  Lab 09/11/18 0404  TROPONINI 0.15*   ------------------------------------------------------------------------------------------------------------------  RADIOLOGY:  Dg Chest Portable 1 View  Result Date: 09/10/2018 CLINICAL DATA:  Cough and fever. EXAM: PORTABLE CHEST 1 VIEW COMPARISON:  None. FINDINGS: The heart size and mediastinal contours are within normal limits. Normal pulmonary vascularity. The lungs are hyperinflated with emphysematous changes and coarsened interstitial markings. Patchy reticulonodular densities in the right upper and lower lobes. No pleural effusion or pneumothorax. No acute osseous abnormality. IMPRESSION: 1. COPD. Patchy reticulonodular densities in the right upper and lower lobes likely reflect superimposed infection or inflammation. Followup to resolution is recommended. Electronically Signed   By: Obie Dredge M.D.   On: 09/10/2018 14:32    EKG:   Orders placed or performed during the hospital encounter of 09/10/18  . EKG 12-Lead  . EKG 12-Lead  . ED EKG 12-Lead  . ED EKG 12-Lead  .  EKG 12-Lead  . EKG 12-Lead  . EKG 12-Lead  . EKG 12-Lead    ASSESSMENT AND PLAN:   Fayne NorrieDiane Rolland  is a 77 y.o. female with a known history of moderate Alzheimer's dementia who just moved into an assisted living facility  brought in secondary to worsening cough, fever and congestion since yesterday.  1. Sepsis-secondary to multifocal pneumonia.  No aspiration reported -MRSA PCR negative, dc vancomycin. -Blood cultures pending. Continue cefepime -White count is significantly elevated- some improvement today.  Continue to monitor -Also check urine analysis  2.  Acute hypoxic respiratory failure-secondary to multifocal pneumonia -Currently using BiPAP.  wean as tolerated.  - Not on oxygen at the assisted living facility.  Chest x-ray confirming the multifocal pneumonia- f/u in 1-2 days  3.  Hypernatremia- gentle hydration with D5 half-normal saline for now and monitor Hypokalemia- being replaced  4.  Dementia-seems to be confused at baseline.  Might have behavioral issues as using risperidone twice daily and also as needed.  5.  DVT prophylaxis-Lovenox  Physical therapy consulted. -Also palliative care consulted for goals of care conversation    All the records are reviewed and case discussed with Care Management/Social Workerr. Management plans discussed with the patient, family and they are in agreement.  CODE STATUS: Full Code  TOTAL TIME TAKING CARE OF THIS PATIENT: 37 minutes.   POSSIBLE D/C IN 2-3 DAYS, DEPENDING ON CLINICAL CONDITION.   Enid BaasKALISETTI,Kaiyden Simkin M.D on 09/11/2018 at 7:42 AM  Between 7am to 6pm - Pager - [phone]  After 6pm go to [email or web address] - password [email or web address]  [facility] [place] [facility]  Office  [phone]  CC: Primary care physician; Patient, No Pcp Per

## 2018-09-11 NOTE — Progress Notes (Addendum)
Inpatient Diabetes Program Recommendations  AACE/ADA: New Consensus Statement on Inpatient Glycemic Control (2015)  Target Ranges:  Prepandial:   less than 140 mg/dL      Peak postprandial:   less than 180 mg/dL (1-2 hours)      Critically ill patients:  140 - 180 mg/dL    Results for Fayne NorrieLYONS, Kayia (MRN [id number]) as of 09/11/2018 06:00  Ref. Range 09/10/2018 13:49 09/11/2018 04:04  Glucose Latest Ref Range: 70 - 99 mg/dL 782161 (H) 956248 (H)    Admit with: Sepsis/ Pneumonia  History: Dementia  NO History of Diabetes noted in H&P.       MD- Note that patient does not have History of DM, however, lab glucose likely elevated this AM due to Solumedrol 40 mg BID.  If patient to remain on steroids, please consider placing orders for Novolog Sensitive Correction Scale/ SSI (0-9 units) Q4 hours (currently NPO)      --Will follow patient during hospitalization--  Ambrose FinlandJeannine Johnston Presley Summerlin RN, MSN, CDE Diabetes Coordinator Inpatient Glycemic Control Team Team Pager: [phone] (8a-5p)

## 2018-09-11 NOTE — Progress Notes (Signed)
Initial Nutrition Assessment  DOCUMENTATION CODES:   Severe malnutrition in context of chronic illness  INTERVENTION:  Provide Hormel Shake (Vital Cuisine) po TID with meals, each supplement provides 520 kcal and 22 grams of protein. Patient prefers vanilla.  Monitor magnesium, potassium, and phosphorus daily for at least 3 days, MD to replete as needed, as pt is at risk for refeeding syndrome given severe malnutrition.  NUTRITION DIAGNOSIS:   Severe Malnutrition related to chronic illness(COPD, dementia) as evidenced by severe fat depletion, severe muscle depletion.  GOAL:   Patient will meet greater than or equal to 90% of their needs  MONITOR:   PO intake, Supplement acceptance, Diet advancement, Labs, Weight trends, I & O's  REASON FOR ASSESSMENT:   Malnutrition Screening Tool, Consult Assessment of nutrition requirement/status  ASSESSMENT:   77 year old female with PMHx of dementia who is admitted with acute on chronic respiratory failure, findings consistent with advanced COPD, acute exacerbation of COPD, PNA.   -Following SLP evaluation today patient was placed on dysphagia 1 diet with nectar-thick liquids.  Per review of chart patient moved into [facility] on 08/18/2018 after APS got involved.  Met with patient at bedside. She was not able to provide much of a history in setting of confusion. Patient was working with SLP. She reports she enjoys vanilla milkshakes.   No weight history in chart to trend. Patient is currently 40.7 kg (89.73 lbs). She believes she used to weigh 125 lbs.  Medications reviewed and include: famotidine, Novolog 0-9 units TID, Novolog 0-5 units QHS, Solu-Medrol 40 mg Q12hrs IV, cefepime, D5-1/2NS at 75 mL/hr.  Labs reviewed: CBG 158, Sodium 148, Potassium 3, Chloride 113, BUN 25, Phosphorus 3.1, Magnesium 2.1.  Discussed with RN and on rounds.  NUTRITION - FOCUSED PHYSICAL EXAM:    Most Recent Value  Orbital Region  Severe  depletion  Upper Arm Region  Severe depletion  Thoracic and Lumbar Region  Severe depletion  Buccal Region  Severe depletion  Temple Region  Severe depletion  Clavicle Bone Region  Severe depletion  Clavicle and Acromion Bone Region  Severe depletion  Scapular Bone Region  Severe depletion  Dorsal Hand  Severe depletion  Patellar Region  Severe depletion  Anterior Thigh Region  Severe depletion  Posterior Calf Region  Severe depletion  Edema (RD Assessment)  None  Hair  Reviewed  Eyes  Unable to assess  Mouth  Reviewed [edentulous]  Skin  Reviewed  Nails  Reviewed     Diet Order:   Diet Order            DIET - DYS 1 Room service appropriate? Yes with Assist; Fluid consistency: Nectar Thick  Diet effective now             EDUCATION NEEDS:   Not appropriate for education at this time  Skin:  Skin Assessment: Reviewed RN Assessment  Last BM:  Unknown/PTA  Height:   Ht Readings from Last 1 Encounters:  09/10/18 4' 10"  (1.473 m)   Weight:   Wt Readings from Last 1 Encounters:  09/10/18 40.7 kg   Ideal Body Weight:  43.9 kg  BMI:  Body mass index is 18.75 kg/m.  Estimated Nutritional Needs:   Kcal:  1200-1400  Protein:  60-70 grams  Fluid:  1.2-1.4 L/day  Willey Blade, MS, RD, LDN Office: [phone] Pager: [phone] After Hours/Weekend Pager: [phone]

## 2018-09-11 NOTE — Progress Notes (Signed)
*  PRELIMINARY RESULTS* Echocardiogram 2D Echocardiogram has been performed.  Cristela BlueHege, Bular Hickok 09/11/2018, 2:28 PM

## 2018-09-12 DIAGNOSIS — E43 Unspecified severe protein-calorie malnutrition: Secondary | ICD-10-CM

## 2018-09-12 LAB — HEPATIC FUNCTION PANEL
ALT: 43 U/L (ref 0–44)
AST: 26 U/L (ref 15–41)
Albumin: 2.2 g/dL — ABNORMAL LOW (ref 3.5–5.0)
Alkaline Phosphatase: 122 U/L (ref 38–126)
TOTAL PROTEIN: 5.4 g/dL — AB (ref 6.5–8.1)
Total Bilirubin: 0.4 mg/dL (ref 0.3–1.2)

## 2018-09-12 LAB — CBC
HCT: 34.3 % — ABNORMAL LOW (ref 36.0–46.0)
Hemoglobin: 10.7 g/dL — ABNORMAL LOW (ref 12.0–15.0)
MCH: 31.4 pg (ref 26.0–34.0)
MCHC: 31.2 g/dL (ref 30.0–36.0)
MCV: 100.6 fL — AB (ref 80.0–100.0)
PLATELETS: 602 10*3/uL — AB (ref 150–400)
RBC: 3.41 MIL/uL — ABNORMAL LOW (ref 3.87–5.11)
RDW: 13.7 % (ref 11.5–15.5)
WBC: 27.8 10*3/uL — ABNORMAL HIGH (ref 4.0–10.5)
nRBC: 0 % (ref 0.0–0.2)

## 2018-09-12 LAB — DIFFERENTIAL
BASOS ABS: 0.1 10*3/uL (ref 0.0–0.1)
BASOS PCT: 0 %
Eosinophils Absolute: 0 10*3/uL (ref 0.0–0.5)
Eosinophils Relative: 0 %
LYMPHS PCT: 2 %
Lymphs Abs: 0.4 10*3/uL — ABNORMAL LOW (ref 0.7–4.0)
MONO ABS: 0.7 10*3/uL (ref 0.1–1.0)
MONOS PCT: 3 %
Neutro Abs: 26.7 10*3/uL — ABNORMAL HIGH (ref 1.7–7.7)
Neutrophils Relative %: 95 %

## 2018-09-12 LAB — ECHOCARDIOGRAM COMPLETE
Height: 58 in
WEIGHTICAEL: 1435.64 [oz_av]

## 2018-09-12 LAB — BASIC METABOLIC PANEL
Anion gap: 6 (ref 5–15)
BUN: 25 mg/dL — AB (ref 8–23)
CALCIUM: 8.6 mg/dL — AB (ref 8.9–10.3)
CO2: 27 mmol/L (ref 22–32)
Chloride: 113 mmol/L — ABNORMAL HIGH (ref 98–111)
Creatinine, Ser: 0.63 mg/dL (ref 0.44–1.00)
GFR calc Af Amer: >60 mL/min (ref >60)
GLUCOSE: 184 mg/dL — AB (ref 70–99)
Potassium: 4.2 mmol/L (ref 3.5–5.1)
SODIUM: 146 mmol/L — AB (ref 135–145)

## 2018-09-12 LAB — GLUCOSE, CAPILLARY
GLUCOSE-CAPILLARY: 166 mg/dL — AB (ref 70–99)
GLUCOSE-CAPILLARY: 159 mg/dL — AB (ref 70–99)
Glucose-Capillary: 167 mg/dL — ABNORMAL HIGH (ref 70–99)
Glucose-Capillary: 172 mg/dL — ABNORMAL HIGH (ref 70–99)

## 2018-09-12 MED ORDER — IPRATROPIUM-ALBUTEROL 0.5-2.5 (3) MG/3ML IN SOLN
3.0000 mL | Freq: Four times a day (QID) | RESPIRATORY_TRACT | Status: DC
  Administered 2018-09-13: 3 mL via RESPIRATORY_TRACT
  Filled 2018-09-12: qty 3

## 2018-09-12 MED ORDER — PREDNISONE 50 MG PO TABS
50.0000 mg | ORAL_TABLET | Freq: Every day | ORAL | Status: DC
  Administered 2018-09-13: 50 mg via ORAL
  Filled 2018-09-12: qty 1

## 2018-09-12 MED ORDER — SODIUM CHLORIDE 0.9 % IV SOLN
2.0000 g | INTRAVENOUS | Status: DC
  Administered 2018-09-13 – 2018-09-14 (×2): 2 g via INTRAVENOUS
  Filled 2018-09-12 (×3): qty 2

## 2018-09-12 NOTE — Clinical Social Work Note (Signed)
CSW consulted due to patient being from [facility] and potentially needing SNF level of care. Patient is being followed by Palliative and if indicated, the patient's son has reportedly agreed to hospice. CSW will await patient's progress and then patient will require a PT consult should aggressive care be chosen by patient's son.  York SpanielMonica Lelani Garnett MSW,LCSW [phone]

## 2018-09-12 NOTE — Progress Notes (Signed)
Patient noncompliant with oxygen, patient found without oxygen 2x now. Currently SAT at 91% on Room air. Placed 2L Bode back on patient. RN aware and at bedside.

## 2018-09-12 NOTE — Progress Notes (Signed)
[employer] [facility] [place] [facility]   PATIENT NAME: Shelly Duran    MR#:  Duran  DATE OF BIRTH:  05/25/1941  SUBJECTIVE:  CHIEF COMPLAINT:   Chief Complaint  Patient presents with  . Cough  . Shortness of Breath   -Off BiPAP now, feels some better. -More alert and answering simple questions.  Not oriented  REVIEW OF SYSTEMS:  Review of Systems  Unable to perform ROS: Dementia    DRUG ALLERGIES:  No Known Allergies  VITALS:  Blood pressure 118/72, pulse 88, temperature (!) 97.1 F (36.2 C), temperature source Axillary, resp. rate 19, height 4\' 10"  (1.473 m), weight 40.7 kg, SpO2 97 %.  PHYSICAL EXAMINATION:  Physical Exam  GENERAL:  77 y.o.-year-old patient lying in the bed with no acute distress.  Severely malnourished EYES: Pupils equal, round, reactive to light and accommodation. No scleral icterus. Extraocular muscles intact.  HEENT: Head atraumatic, normocephalic. Oropharynx and nasopharynx clear.  NECK:  Supple, no jugular venous distention. No thyroid enlargement, no tenderness.  LUNGS: Normal breath sounds bilaterally, no wheezing, coarse rhonchi bilaterally, no rales  or crepitation. No use of accessory muscles of respiration.  CARDIOVASCULAR: S1, S2 normal. No  rubs, or gallops. 3/6 systolic murmur present ABDOMEN: Soft, nontender, nondistended. Bowel sounds present. No organomegaly or mass.  EXTREMITIES: No pedal edema, cyanosis, or clubbing.  NEUROLOGIC: Cranial nerves II through XII are intact. Muscle strength 5/5 in all extremities. Sensation intact. Gait not checked.  Following simple commands PSYCHIATRIC: The patient is alert and oriented to self.  SKIN: No obvious rash, lesion, or ulcer.    LABORATORY PANEL:   CBC Recent Labs  Lab 09/12/18 0428  WBC 27.8*  HGB 10.7*  HCT 34.3*  PLT 602*   ------------------------------------------------------------------------------------------------------------------  Chemistries    Recent Labs  Lab 09/11/18 0404 09/12/18 0428  NA 148* 146*  K 3.0* 4.2  CL 113* 113*  CO2 28 27  GLUCOSE 248* 184*  BUN 25* 25*  CREATININE 0.72 0.63  CALCIUM 8.3* 8.6*  MG 2.1  --   AST  --  26  ALT  --  43  ALKPHOS  --  122  BILITOT  --  0.4   ------------------------------------------------------------------------------------------------------------------  Cardiac Enzymes Recent Labs  Lab 09/11/18 1607  TROPONINI 0.15*   ------------------------------------------------------------------------------------------------------------------  RADIOLOGY:  Dg Chest Portable 1 View  Result Date: 09/10/2018 CLINICAL DATA:  Cough and fever. EXAM: PORTABLE CHEST 1 VIEW COMPARISON:  None. FINDINGS: The heart size and mediastinal contours are within normal limits. Normal pulmonary vascularity. The lungs are hyperinflated with emphysematous changes and coarsened interstitial markings. Patchy reticulonodular densities in the right upper and lower lobes. No pleural effusion or pneumothorax. No acute osseous abnormality. IMPRESSION: 1. COPD. Patchy reticulonodular densities in the right upper and lower lobes likely reflect superimposed infection or inflammation. Followup to resolution is recommended. Electronically Signed   By: Obie DredgeWilliam T Derry M.D.   On: 09/10/2018 14:32    EKG:   Orders placed or performed during the hospital encounter of 09/10/18  . EKG 12-Lead  . EKG 12-Lead  . ED EKG 12-Lead  . ED EKG 12-Lead  . EKG 12-Lead  . EKG 12-Lead  . EKG 12-Lead  . EKG 12-Lead    ASSESSMENT AND PLAN:   Shelly Duran  is a 77 y.o. Duran with a known history of moderate Alzheimer's dementia who just moved into an assisted living facility brought in secondary to worsening cough, fever and congestion since yesterday.  1. Sepsis-secondary to multifocal pneumonia.  No aspiration reported -MRSA PCR negative, dc vancomycin. -Blood cultures negative so far. Continue cefepime -White count is  significantly elevated-check a differential. -Unfortunately urine analysis was never sent  2.  Acute hypoxic respiratory failure-secondary to multifocal pneumonia -Off BiPAP and on nasal cannula now. - Not on oxygen at the assisted living facility.  Chest x-ray confirming the multifocal pneumonia- f/u tomorrow  3.  Hypernatremia- gentle hydration with D5 half-normal saline for now and monitor Hypokalemia- replaced  4.  Dementia-seems to be confused at baseline.  Might have behavioral issues as using risperidone twice daily and also as needed. -Seems to be at baseline now  5.  DVT prophylaxis-Lovenox  6.  Troponin is elevated secondary to demand ischemia.  Plateaued, no further cardiac work-up needed.  Physical therapy consulted.  At baseline ambulates with a walker -Appreciate palliative care input for goals of conversation.    All the records are reviewed and case discussed with Care Management/Social Workerr. Management plans discussed with the patient, family and they are in agreement.  CODE STATUS: DNR  TOTAL TIME TAKING CARE OF THIS PATIENT: 37 minutes.   POSSIBLE D/C IN 2-3 DAYS, DEPENDING ON CLINICAL CONDITION.   Enid Baas M.D on 09/12/2018 at 10:16 AM  Between 7am to 6pm - Pager - [phone]  After 6pm go to [email or web address] - password [employer]  [facility] [place] [facility]  Office  [phone]  CC: Primary care physician; Patient, No Pcp Per

## 2018-09-12 NOTE — Progress Notes (Signed)
Speech Language Pathology Treatment: Dysphagia  Patient Details Name: Shelly Duran MRN: [id number] DOB: 1940/11/01 Today's Date: 09/12/2018 Time: 8657-8469 SLP Time Calculation (min) (ACUTE ONLY): 33 min  Assessment / Plan / Recommendation Clinical Impression  Pt seen for ongoing assessment of toleration of Dysphagia diet recommended at BSE yesterday. Pt has significant Cognitive decline d/t moderate Dementia baseline. Pt required verbal/tactile/visual cues for follow through w/ self-feeding tasks and monitoring during the tasks, but the pt was able to hold the cup today for self-feeding (drinking). Pt was quite distracted but attended to po tasks w/ cues from SLP, minimizing environmental distractions in room such as TV, and sitting at pt's eye level during tasks. No reports of difficulty w/ AM meal per NSG/chart notes. Pt kept removing her [place] O2 despite encouragement to leave in place. Pt consumed po trials of NECTAR consistency liquids via cup and straw w/ no immediate, overt s/s of aspiration noted; no decline in vocal quality or respiratory status during/post trials. Oral phase appeared grossly [facility] for timely bolus management and A-P transfer of liquids; oral clearing was appropriate. Pt was easily distracted and often took off her [place] O2 support placing it on her head despite encouragement to keep it in place.  Recommend continue w/ current Dysphagia level 1 (PUREE) diet w/ NECTAR consistency liquids at this time d/t her Cognitive decline and risk for Dysphagia/Aspiration. ST services will f/u at a meal w/ trials to upgrade the food consistency w/ trials of thin liquids when indicated; possible need for objective swallow study d/t pt's Cognitive decline. Recommend Pills in Puree - Crushed as indicated; aspiration precautions and assistance during meals to ensure oral intake d/t Cognitive decline. NSG updated.      HPI HPI: Pt is a 77 y.o. female with a known history of moderate Alzheimer's  dementia (noted in chart notes since 2015)) who just moved into an assisted living facility (~1 month ago per chart notes) brought in secondary to worsening cough, fever and congestion since yesterday. Due to her underlying dementia, patient is a very poor historian.  She has seen her PCP last month and was noted to be very malnourished and family concerned that she is forgetting to eat.  Patient is followed by social workers and adult protective services.  Due to these concerns, patient is more to assisted living facility.  Most of the history is obtained from the ER physician and the charts.  Apparently she has been coughing and was running a low-grade fever since yesterday.  She sounds pretty hoarse in the ED today and chest x-ray confirming multifocal pneumonia.  She has elevated white count and is noted to be dyspneic and tachycardic.  She was started on BiPAP for her breathing.  This morning, pt is asking about the "2 boys" playing in the room - she is referring to her Sons per her report.  She requires Mod verbal cues to attend to tasks during swallowing evaluation and does NOT initiate self-feeding or the awareness of such.       SLP Plan  Continue with current plan of care(possible need for MBSS prior to d/c)       Recommendations  Diet recommendations: Dysphagia 1 (puree);Nectar-thick liquid Liquids provided via: Cup;Straw(monitor) Medication Administration: Crushed with puree(as able for safer swallowing) Supervision: Patient able to self feed;Staff to assist with self feeding;Full supervision/cueing for compensatory strategies Compensations: Minimize environmental distractions;Slow rate;Small sips/bites;Lingual sweep for clearance of pocketing;Multiple dry swallows after each bite/sip;Follow solids with liquid Postural Changes and/or Swallow  Maneuvers: Seated upright 90 degrees;Upright 30-60 min after meal                General recommendations: (Dietician f/u for nutritional  support) Oral Care Recommendations: Oral care BID;Staff/trained caregiver to provide oral care Follow up Recommendations: Skilled Nursing facility(TBD) SLP Visit Diagnosis: Dysphagia, oropharyngeal phase (R13.12)(baseline Cognitive decline d/t Dementia) Plan: Continue with current plan of care(possible need for MBSS prior to d/c)       GO                Jerilynn SomKatherine Watson, MS, CCC-SLP Watson,Katherine 09/12/2018, 3:20 PM

## 2018-09-12 NOTE — Progress Notes (Signed)
Per Respiratory Protocol Assessment, patient scored a level 2, Duoneb frequency changed from Q4 to QID.

## 2018-09-12 NOTE — Clinical Social Work Note (Signed)
CSW spoke with Shelly Duran at [facility] and she is aware of patient being total care with her ADL's. Shelly Duran states she wishes to have patient return to her at discharge with home health. Patient has transitioned from ICU to medical floor. York SpanielMonica Yaw Escoto MSW,LCSW [phone]

## 2018-09-12 NOTE — Progress Notes (Signed)
Palliative Medicine [facility]  [phone] [phone]   Name: Shelly Duran Date: 09/12/2018 MRN: [id number]  DOB: [birth year]  Patient Care Team: Patient, No Pcp Per as PCP - General (General Practice)    REASON FOR CONSULTATION: Palliative Care consult requested for this 77 y.o. female with multiple medical problems including Alzheimer's dementia, who was admitted on 09/10/18 with multifocal pneumonia. Palliative care was consulted to help address goals.   CODE STATUS: DNR  PAST MEDICAL HISTORY: Past Medical History:  Diagnosis Date  . Dementia (HCC)     PAST SURGICAL HISTORY: History reviewed. No pertinent surgical history.  HEMATOLOGY/ONCOLOGY HISTORY:   No history exists.    ALLERGIES:  has No Known Allergies.  MEDICATIONS:  Current Facility-Administered Medications  Medication Dose Route Frequency Provider Last Rate Last Dose  . acetaminophen (TYLENOL) tablet 650 mg  650 mg Oral Q6H PRN Enid Baas, MD       Or  . acetaminophen (TYLENOL) suppository 650 mg  650 mg Rectal Q6H PRN Enid Baas, MD      . aspirin EC tablet 81 mg  81 mg Oral Daily Duanne Limerick, Maged, MD   81 mg at 09/11/18 1556  . budesonide (PULMICORT) nebulizer solution 0.5 mg  0.5 mg Nebulization BID Erin Fulling, MD   0.5 mg at 09/12/18 0731  . ceFEPIme (MAXIPIME) 2 g in sodium chloride 0.9 % 100 mL IVPB  2 g Intravenous Q12H Ronnald Ramp, RPH 200 mL/hr at 09/12/18 0342 2 g at 09/12/18 0342  . chlorhexidine (PERIDEX) 0.12 % solution 15 mL  15 mL Mouth Rinse BID Erin Fulling, MD   15 mL at 09/10/18 2200  . dextrose 5 %-0.45 % sodium chloride infusion   Intravenous Continuous Enid Baas, MD 75 mL/hr at 09/12/18 0800    . enoxaparin (LOVENOX) injection 30 mg  30 mg Subcutaneous Q24H Laureen Ochs K, RPH   30 mg at 09/11/18 1656  . famotidine (PEPCID) tablet 20 mg  20 mg Oral Daily Samaan, Maged, MD   20 mg at 09/11/18 1556  .  guaiFENesin (MUCINEX) 12 hr tablet 600 mg  600 mg Oral BID Enid Baas, MD      . insulin aspart (novoLOG) injection 0-5 Units  0-5 Units Subcutaneous QHS Samaan, Maged, MD      . insulin aspart (novoLOG) injection 0-9 Units  0-9 Units Subcutaneous TID WC Uvaldo Rising, MD   2 Units at 09/12/18 0801  . ipratropium-albuterol (DUONEB) 0.5-2.5 (3) MG/3ML nebulizer solution 3 mL  3 mL Nebulization Q4H Erin Fulling, MD   3 mL at 09/12/18 0731  . MEDLINE mouth rinse  15 mL Mouth Rinse q12n4p Erin Fulling, MD   15 mL at 09/11/18 1557  . methimazole (TAPAZOLE) tablet 5 mg  5 mg Oral Daily Enid Baas, MD      . methylPREDNISolone sodium succinate (SOLU-MEDROL) 40 mg/mL injection 40 mg  40 mg Intravenous Q12H Erin Fulling, MD   40 mg at 09/12/18 0531  . ondansetron (ZOFRAN) tablet 4 mg  4 mg Oral Q6H PRN Enid Baas, MD       Or  . ondansetron (ZOFRAN) injection 4 mg  4 mg Intravenous Q6H PRN Enid Baas, MD      . risperiDONE (RISPERDAL) tablet 1 mg  1 mg Oral BID Enid Baas, MD        VITAL SIGNS: BP 118/72 (BP Location: Left Arm)   Pulse 88   Temp (!) 97.1 F (36.2 C) (  Axillary)   Resp 19   Ht 4\' 10"  (1.473 m)   Wt 89 lb 11.6 oz (40.7 kg)   SpO2 97%   BMI 18.75 kg/m  Filed Weights   09/10/18 1343 09/10/18 1652  Weight: 100 lb (45.4 kg) 89 lb 11.6 oz (40.7 kg)    Estimated body mass index is 18.75 kg/m as calculated from the following:   Height as of this encounter: 4\' 10"  (1.473 m).   Weight as of this encounter: 89 lb 11.6 oz (40.7 kg).  LABS: CBC:    Component Value Date/Time   WBC 27.8 (H) 09/12/2018 0428   HGB 10.7 (L) 09/12/2018 0428   HCT 34.3 (L) 09/12/2018 0428   PLT 602 (H) 09/12/2018 0428   MCV 100.6 (H) 09/12/2018 0428   NEUTROABS 24.3 (H) 09/10/2018 1349   LYMPHSABS 0.5 (L) 09/10/2018 1349   MONOABS 2.0 (H) 09/10/2018 1349   EOSABS 0.1 09/10/2018 1349   BASOSABS 0.1 09/10/2018 1349   Comprehensive Metabolic Panel:      Component Value Date/Time   NA 146 (H) 09/12/2018 0428   K 4.2 09/12/2018 0428   CL 113 (H) 09/12/2018 0428   CO2 27 09/12/2018 0428   BUN 25 (H) 09/12/2018 0428   CREATININE 0.63 09/12/2018 0428   GLUCOSE 184 (H) 09/12/2018 0428   CALCIUM 8.6 (L) 09/12/2018 0428   AST 26 09/12/2018 0428   ALT 43 09/12/2018 0428   ALKPHOS 122 09/12/2018 0428   BILITOT 0.4 09/12/2018 0428   PROT 5.4 (L) 09/12/2018 0428   ALBUMIN 2.2 (L) 09/12/2018 0428    RADIOGRAPHIC STUDIES: Dg Chest Portable 1 View  Result Date: 09/10/2018 CLINICAL DATA:  Cough and fever. EXAM: PORTABLE CHEST 1 VIEW COMPARISON:  None. FINDINGS: The heart size and mediastinal contours are within normal limits. Normal pulmonary vascularity. The lungs are hyperinflated with emphysematous changes and coarsened interstitial markings. Patchy reticulonodular densities in the right upper and lower lobes. No pleural effusion or pneumothorax. No acute osseous abnormality. IMPRESSION: 1. COPD. Patchy reticulonodular densities in the right upper and lower lobes likely reflect superimposed infection or inflammation. Followup to resolution is recommended. Electronically Signed   By: Obie DredgeWilliam T Derry M.D.   On: 09/10/2018 14:32    Review of Systems As noted above. Otherwise, a complete review of systems is negative.  Physical Exam General: thin, frail appearing Cardiovascular: regular rate and rhythm Pulmonary: clear ant fields, on O2 Abdomen: soft, nontender, + bowel sounds Extremities: no edema Skin: no rashes Neurological: alert, pleasantly confused  IMPRESSION: Patient appears clinically improved today.  She has remained off BiPAP.  She does not appear short of breath at present.  Patient is also more alert and answers some simple questions.  She remains pleasantly confused, which I suspect is her baseline.   I called and spoke with patient's son, Shelly Duran. Updated him on patient's status.   Will follow her clinical  status.  PLAN: Continue medical treatment Will follow   Time Total: 30 minutes  Visit consisted of counseling and education dealing with the complex and emotionally intense issues of symptom management and palliative care in the setting of serious and potentially life-threatening illness.Greater than 50%  of this time was spent counseling and coordinating care related to the above assessment and plan.  Signed by: Laurette SchimkeJoshua Odie Edmonds, PhD, DNP, NP-C, [facility] [phone] (Work Cell)

## 2018-09-12 NOTE — Evaluation (Signed)
Physical Therapy Evaluation Patient Details Name: Shelly Duran MRN: [id number] DOB: 12/02/1940 Today's Date: 09/12/2018   History of Present Illness  77 y.o. female with a known history of Alzheimer's dementia who just moved into an assisted living facility brought in secondary to worsening cough, fever and congestion  Clinical Impression  Pt pleasant t/o session, but had considerable confusion and overall was limited due to this.  She was able to participate with some b/l LE bed exercises and showed reasonable strength, however unable to see how this translated to functional strength secondary to her unwillingness to even sit up much less attempt standing/walking.  She did not c/o a lot of fatigue with the effort of bed exercises but HR increased to 110s with PVCs.  Overall she is unable/unwilling to do much functionally, will maintain on PT caseload to try to further assess and make appropriate d/c recommendations.     Follow Up Recommendations Supervision/Assistance - 24 hour(per further assessment, HHPT vs STR vs hospice)    Equipment Recommendations  None recommended by PT    Recommendations for Other Services       Precautions / Restrictions Precautions Precautions: Fall Restrictions Weight Bearing Restrictions: No      Mobility  Bed Mobility               General bed mobility comments: Pt refusing to get to sitting or do any real movement/mobility, the "noise from the [employer] in the room next door," her gown (PT offered to tie it before even getting up) etc and ultimately is seemed futile to convince her to try given her demeanor  Transfers                    Ambulation/Gait                Stairs            Wheelchair Mobility    Modified Rankin (Stroke Patients Only)       Balance                                             Pertinent Vitals/Pain Pain Assessment: No/denies pain    Home Living Family/patient  expects to be discharged to:: Unsure                 Additional Comments: hospice vs rehab vs return to ALF    Prior Function           Comments: unable to assess secondary to severe dementia, pt indicates that she did at least some walking     Hand Dominance        Extremity/Trunk Assessment   Upper Extremity Assessment Upper Extremity Assessment: Overall WFL for tasks assessed;Generalized weakness    Lower Extremity Assessment Lower Extremity Assessment: Overall WFL for tasks assessed;Generalized weakness       Communication   Communication: No difficulties  Cognition Arousal/Alertness: (dementia, stated name and birthday, no date, location, etc) Behavior During Therapy: Impulsive;Restless Overall Cognitive Status: History of cognitive impairments - at baseline                                 General Comments: Pt speaking off topic most of the time      General Comments  Exercises General Exercises - Lower Extremity Ankle Circles/Pumps: AROM;5 reps Heel Slides: AROM;5 reps Hip ABduction/ADduction: Strengthening;5 reps Straight Leg Raises: AROM;5 reps   Assessment/Plan    PT Assessment Patient needs continued PT services  PT Problem List Decreased strength;Decreased range of motion;Decreased activity tolerance;Decreased mobility;Decreased balance;Decreased coordination;Decreased knowledge of use of DME;Decreased cognition;Decreased safety awareness;Cardiopulmonary status limiting activity       PT Treatment Interventions DME instruction;Gait training;Stair training;Functional mobility training;Therapeutic activities;Therapeutic exercise;Balance training;Neuromuscular re-education;Patient/family education    PT Goals (Current goals can be found in the Care Plan section)  Acute Rehab PT Goals Patient Stated Goal: unable to state PT Goal Formulation: Patient unable to participate in goal setting Time For Goal Achievement:  09/26/18 Potential to Achieve Goals: Fair    Frequency Min 2X/week (3 day trial to assess potential for participation and further intervention)   Barriers to discharge        Co-evaluation               AM-PAC PT "6 Clicks" Daily Activity  Outcome Measure Difficulty turning over in bed (including adjusting bedclothes, sheets and blankets)?: Unable Difficulty moving from lying on back to sitting on the side of the bed? : Unable Difficulty sitting down on and standing up from a chair with arms (e.g., wheelchair, bedside commode, etc,.)?: Unable Help needed moving to and from a bed to chair (including a wheelchair)?: Total Help needed walking in hospital room?: Total Help needed climbing 3-5 steps with a railing? : Total 6 Click Score: 6    End of Session   Activity Tolerance: (limited due to mental status ) Patient left: with call bell/phone within reach;with bed alarm set   PT Visit Diagnosis: Muscle weakness (generalized) (M62.81);Difficulty in walking, not elsewhere classified (R26.2)    Time: 5284-1324 PT Time Calculation (min) (ACUTE ONLY): 18 min   Charges:   PT Evaluation $PT Eval Low Complexity: 1 Low          Malachi Pro, DPT 09/12/2018, 1:37 PM

## 2018-09-12 NOTE — Progress Notes (Signed)
Pharmacy Antibiotic Note  Shelly Duran is a 77 y.o. female admitted on 09/10/2018 with pneumonia.  Pharmacy has been consulted for cefepime and vancomycin dosing. [age] yo very thin and frail WF with acute and severe resp failure with COPD exacerbation and multifocal pneumonia on biPAP.   Ke: 0.038, t1/2: 18.24, Vd 31.78.   Plan: MRSA PCR is negative, Vancomycin discontinued.  Cefepime dose adjusted to Cefepime 2g IV every 24 hours. .  Height: 4\' 10"  (147.3 cm) Weight: 89 lb 11.6 oz (40.7 kg) IBW/kg (Calculated) : 40.9  Temp (24hrs), Avg:98.1 F (36.7 C), Min:97.1 F (36.2 C), Max:98.9 F (37.2 C)  Recent Labs  Lab 09/10/18 1349 09/10/18 1354 09/10/18 1557 09/11/18 0404 09/12/18 0428  WBC 27.3*  --   --  25.8* 27.8*  CREATININE 0.78  --   --  0.72 0.63  LATICACIDVEN  --  1.69 1.90  --   --     Estimated Creatinine Clearance: 39 mL/min (by C-G formula based on SCr of 0.63 mg/dL).    No Known Allergies  Antimicrobials this admission: 11/17 cefepime >>  11/17 vancomycin >> 11/18  Dose adjustments this admission: Cefepime 2 g q12H per renal function (CrCl: 30-6850ml/min)  Microbiology results: 11/17 BCx: pending  11/17 MRSA PCR: negative  Thank you for allowing pharmacy to be a part of this patient's care.  Shelly Duran, PharmD, BCPS Clinical Pharmacist 09/12/2018 11:49 AM

## 2018-09-13 ENCOUNTER — Inpatient Hospital Stay: Payer: Medicare Other

## 2018-09-13 LAB — CBC
HCT: 33.2 % — ABNORMAL LOW (ref 36.0–46.0)
Hemoglobin: 10.4 g/dL — ABNORMAL LOW (ref 12.0–15.0)
MCH: 31.2 pg (ref 26.0–34.0)
MCHC: 31.3 g/dL (ref 30.0–36.0)
MCV: 99.7 fL (ref 80.0–100.0)
NRBC: 0 % (ref 0.0–0.2)
PLATELETS: 601 10*3/uL — AB (ref 150–400)
RBC: 3.33 MIL/uL — ABNORMAL LOW (ref 3.87–5.11)
RDW: 13.5 % (ref 11.5–15.5)
WBC: 24 10*3/uL — ABNORMAL HIGH (ref 4.0–10.5)

## 2018-09-13 LAB — BASIC METABOLIC PANEL
ANION GAP: 4 — AB (ref 5–15)
BUN: 29 mg/dL — AB (ref 8–23)
CALCIUM: 8.4 mg/dL — AB (ref 8.9–10.3)
CO2: 29 mmol/L (ref 22–32)
Chloride: 112 mmol/L — ABNORMAL HIGH (ref 98–111)
Creatinine, Ser: 0.65 mg/dL (ref 0.44–1.00)
GFR calc Af Amer: >60 mL/min (ref >60)
GLUCOSE: 137 mg/dL — AB (ref 70–99)
Potassium: 3.9 mmol/L (ref 3.5–5.1)
Sodium: 145 mmol/L (ref 135–145)

## 2018-09-13 LAB — URINALYSIS, COMPLETE (UACMP) WITH MICROSCOPIC
Bilirubin Urine: NEGATIVE
Glucose, UA: 50 mg/dL — AB
Ketones, ur: NEGATIVE mg/dL
Nitrite: NEGATIVE
Protein, ur: NEGATIVE mg/dL
SPECIFIC GRAVITY, URINE: 1.02 (ref 1.005–1.030)
pH: 5 (ref 5.0–8.0)

## 2018-09-13 LAB — GLUCOSE, CAPILLARY
GLUCOSE-CAPILLARY: 108 mg/dL — AB (ref 70–99)
GLUCOSE-CAPILLARY: 196 mg/dL — AB (ref 70–99)
Glucose-Capillary: 156 mg/dL — ABNORMAL HIGH (ref 70–99)
Glucose-Capillary: 130 mg/dL — ABNORMAL HIGH (ref 70–99)

## 2018-09-13 MED ORDER — PREDNISONE 20 MG PO TABS
40.0000 mg | ORAL_TABLET | Freq: Every day | ORAL | Status: DC
  Administered 2018-09-14: 40 mg via ORAL
  Filled 2018-09-13: qty 2

## 2018-09-13 MED ORDER — IPRATROPIUM-ALBUTEROL 0.5-2.5 (3) MG/3ML IN SOLN
3.0000 mL | Freq: Three times a day (TID) | RESPIRATORY_TRACT | Status: DC
  Administered 2018-09-13 – 2018-09-14 (×3): 3 mL via RESPIRATORY_TRACT
  Filled 2018-09-13 (×3): qty 3

## 2018-09-13 NOTE — Care Management Important Message (Addendum)
Copy of Medicare IM left with patient in room.

## 2018-09-13 NOTE — Progress Notes (Signed)
[facility] [place] [facility]   PATIENT NAME: Shelly NorrieDiane Duran    MR#:  [id number]  DATE OF BIRTH:  02/18/1941  SUBJECTIVE:  CHIEF COMPLAINT:   Chief Complaint  Patient presents with  . Cough  . Shortness of Breath   -Was on 4 L nasal cannula this morning.  Alert but very confused -Eating her breakfast  REVIEW OF SYSTEMS:  Review of Systems  Unable to perform ROS: Dementia    DRUG ALLERGIES:  No Known Allergies  VITALS:  Blood pressure 139/86, pulse 75, temperature 98.3 F (36.8 C), temperature source Oral, resp. rate 18, height 4\' 10"  (1.473 m), weight 40.7 kg, SpO2 98 %.  PHYSICAL EXAMINATION:  Physical Exam  GENERAL:  77 y.o.-year-old patient lying in the bed with no acute distress.  Severely malnourished EYES: Pupils equal, round, reactive to light and accommodation. No scleral icterus. Extraocular muscles intact.  HEENT: Head atraumatic, normocephalic. Oropharynx and nasopharynx clear.  NECK:  Supple, no jugular venous distention. No thyroid enlargement, no tenderness.  LUNGS: Normal breath sounds bilaterally, no wheezing, coarse rhonchi bilaterally, no rales  or crepitation. No use of accessory muscles of respiration.  CARDIOVASCULAR: S1, S2 normal. No  rubs, or gallops. 3/6 systolic murmur present ABDOMEN: Soft, nontender, nondistended. Bowel sounds present. No organomegaly or mass.  EXTREMITIES: No pedal edema, cyanosis, or clubbing.  NEUROLOGIC: Cranial nerves II through XII are intact. Muscle strength 5/5 in all extremities. Sensation intact. Gait not checked.  Following simple commands PSYCHIATRIC: The patient is alert and oriented to self.  SKIN: No obvious rash, lesion, or ulcer.    LABORATORY PANEL:   CBC Recent Labs  Lab 09/13/18 0407  WBC 24.0*  HGB 10.4*  HCT 33.2*  PLT 601*   ------------------------------------------------------------------------------------------------------------------  Chemistries  Recent Labs  Lab  09/11/18 0404 09/12/18 0428 09/13/18 0407  NA 148* 146* 145  K 3.0* 4.2 3.9  CL 113* 113* 112*  CO2 28 27 29   GLUCOSE 248* 184* 137*  BUN 25* 25* 29*  CREATININE 0.72 0.63 0.65  CALCIUM 8.3* 8.6* 8.4*  MG 2.1  --   --   AST  --  26  --   ALT  --  43  --   ALKPHOS  --  122  --   BILITOT  --  0.4  --    ------------------------------------------------------------------------------------------------------------------  Cardiac Enzymes Recent Labs  Lab 09/11/18 1607  TROPONINI 0.15*   ------------------------------------------------------------------------------------------------------------------  RADIOLOGY:  No results found.  EKG:   Orders placed or performed during the hospital encounter of 09/10/18  . EKG 12-Lead  . EKG 12-Lead  . ED EKG 12-Lead  . ED EKG 12-Lead  . EKG 12-Lead  . EKG 12-Lead  . EKG 12-Lead  . EKG 12-Lead    ASSESSMENT AND PLAN:   Shelly NorrieDiane Duran  is a 77 y.o. female with a known history of moderate Alzheimer's dementia who just moved into an assisted living facility brought in secondary to worsening cough, fever and congestion since yesterday.  1. Sepsis-secondary to multifocal pneumonia.  No aspiration reported -MRSA PCR negative, -Blood cultures negative so far. Continue cefepime -White count is significantly elevated. -UA is pending -Follow-up with repeat chest x-ray today  2.  Acute hypoxic respiratory failure-secondary to multifocal pneumonia -Off BiPAP and on 4l nasal cannula now.  Wean as tolerated. - Not on oxygen at the assisted living facility.  Chest x-ray confirming the multifocal pneumonia on admission- f/u today  3.  Hypernatremia-received IV  fluids.  Much improved sodium levels. -Discontinue fluids today and monitor Hypokalemia- replaced  4.  Dementia-seems to be confused at baseline.  Might have behavioral issues as using risperidone twice daily and also as needed. -Seems to be at baseline now  5.  DVT  prophylaxis-Lovenox  6.  Troponin is elevated secondary to demand ischemia.  Plateaued, no further cardiac work-up needed.  Physical therapy consulted.  At baseline ambulates with a walker -Appreciate palliative care input for goals of conversation. If stable, patient will be discharged to assisted living tomorrow with home health services.  If her condition declines, she will be followed with hospice. -Patient's son was agreeable to the plan according to palliative care discussion    All the records are reviewed and case discussed with Care Management/Social Workerr. Management plans discussed with the patient, family and they are in agreement.  CODE STATUS: DNR  TOTAL TIME TAKING CARE OF THIS PATIENT: 36 minutes.   POSSIBLE D/C IN 1-2 DAYS, DEPENDING ON CLINICAL CONDITION.   Enid Baas M.D on 09/13/2018 at 11:41 AM  Between 7am to 6pm - Pager - [phone]  After 6pm go to [email or web address] - password [email or web address]  [facility]  Office  [phone]  CC: Primary care physician; Patient, No Pcp Per

## 2018-09-13 NOTE — Progress Notes (Signed)
Tried to update patient contact information. Shelly Duran (son) and Wilburn CorneliaRenee Aronson (cousin) both added. Son Vanessa RalphsKeith Petras has a [place] number and could not be added [phone]

## 2018-09-13 NOTE — Progress Notes (Signed)
Because of the RT protocol assessment score of 2, the pt's Duoneb nebulizer treatments are changed to TID.

## 2018-09-13 NOTE — Progress Notes (Signed)
Palliative Medicine [facility]  [phone] [phone]   Name: Shelly Duran Date: 09/13/2018 MRN: [id number]  DOB: 07/12/1941  Patient Care Team: Patient, No Pcp Per as PCP - General (General Practice)    REASON FOR CONSULTATION: Palliative Care consult requested for this 77 y.o. female with multiple medical problems including Alzheimer's dementia, who was admitted on 09/10/18 with multifocal pneumonia. Palliative care was consulted to help address goals.   CODE STATUS: DNR  PAST MEDICAL HISTORY: Past Medical History:  Diagnosis Date  . Dementia (HCC)     PAST SURGICAL HISTORY: History reviewed. No pertinent surgical history.  HEMATOLOGY/ONCOLOGY HISTORY:   No history exists.    ALLERGIES:  has No Known Allergies.  MEDICATIONS:  Current Facility-Administered Medications  Medication Dose Route Frequency Provider Last Rate Last Dose  . acetaminophen (TYLENOL) tablet 650 mg  650 mg Oral Q6H PRN Enid BaasKalisetti, Radhika, MD       Or  . acetaminophen (TYLENOL) suppository 650 mg  650 mg Rectal Q6H PRN Enid BaasKalisetti, Radhika, MD      . aspirin EC tablet 81 mg  81 mg Oral Daily Uvaldo RisingSamaan, Maged, MD   81 mg at 09/13/18 0817  . budesonide (PULMICORT) nebulizer solution 0.5 mg  0.5 mg Nebulization BID Erin FullingKasa, Kurian, MD   0.5 mg at 09/13/18 0914  . ceFEPIme (MAXIPIME) 2 g in sodium chloride 0.9 % 100 mL IVPB  2 g Intravenous Q24H Gardner CandleHallaji, Sheema M, RPH   Stopped at 09/13/18 [id number]  . chlorhexidine (PERIDEX) 0.12 % solution 15 mL  15 mL Mouth Rinse BID Erin FullingKasa, Kurian, MD   15 mL at 09/13/18 0819  . enoxaparin (LOVENOX) injection 30 mg  30 mg Subcutaneous Q24H Ellington, Abby K, RPH   30 mg at 09/12/18 1821  . famotidine (PEPCID) tablet 20 mg  20 mg Oral Daily Uvaldo RisingSamaan, Maged, MD   20 mg at 09/13/18 0817  . guaiFENesin (MUCINEX) 12 hr tablet 600 mg  600 mg Oral BID Enid BaasKalisetti, Radhika, MD   600 mg at 09/13/18 0817  . insulin aspart (novoLOG) injection 0-5  Units  0-5 Units Subcutaneous QHS Samaan, Maged, MD      . insulin aspart (novoLOG) injection 0-9 Units  0-9 Units Subcutaneous TID WC Uvaldo RisingSamaan, Maged, MD   2 Units at 09/12/18 1821  . ipratropium-albuterol (DUONEB) 0.5-2.5 (3) MG/3ML nebulizer solution 3 mL  3 mL Nebulization QID Enid BaasKalisetti, Radhika, MD   3 mL at 09/13/18 0915  . MEDLINE mouth rinse  15 mL Mouth Rinse q12n4p Erin FullingKasa, Kurian, MD   15 mL at 09/12/18 1825  . methimazole (TAPAZOLE) tablet 5 mg  5 mg Oral Daily Enid BaasKalisetti, Radhika, MD   5 mg at 09/13/18 0818  . ondansetron (ZOFRAN) tablet 4 mg  4 mg Oral Q6H PRN Enid BaasKalisetti, Radhika, MD       Or  . ondansetron (ZOFRAN) injection 4 mg  4 mg Intravenous Q6H PRN Enid BaasKalisetti, Radhika, MD      . predniSONE (DELTASONE) tablet 50 mg  50 mg Oral Q breakfast Enid BaasKalisetti, Radhika, MD   50 mg at 09/13/18 0817  . risperiDONE (RISPERDAL) tablet 1 mg  1 mg Oral BID Enid BaasKalisetti, Radhika, MD   1 mg at 09/13/18 0818    VITAL SIGNS: BP 139/86   Pulse 75   Temp 98.3 F (36.8 C) (Oral)   Resp 18   Ht 4\' 10"  (1.473 m)   Wt 89 lb 11.6 oz (40.7 kg)   SpO2 98%  BMI 18.75 kg/m  Filed Weights   09/10/18 1343 09/10/18 1652  Weight: 100 lb (45.4 kg) 89 lb 11.6 oz (40.7 kg)    Estimated body mass index is 18.75 kg/m as calculated from the following:   Height as of this encounter: 4\' 10"  (1.473 m).   Weight as of this encounter: 89 lb 11.6 oz (40.7 kg).  LABS: CBC:    Component Value Date/Time   WBC 24.0 (H) 09/13/2018 0407   HGB 10.4 (L) 09/13/2018 0407   HCT 33.2 (L) 09/13/2018 0407   PLT 601 (H) 09/13/2018 0407   MCV 99.7 09/13/2018 0407   NEUTROABS 26.7 (H) 09/12/2018 0428   LYMPHSABS 0.4 (L) 09/12/2018 0428   MONOABS 0.7 09/12/2018 0428   EOSABS 0.0 09/12/2018 0428   BASOSABS 0.1 09/12/2018 0428   Comprehensive Metabolic Panel:    Component Value Date/Time   NA 145 09/13/2018 0407   K 3.9 09/13/2018 0407   CL 112 (H) 09/13/2018 0407   CO2 29 09/13/2018 0407   BUN 29 (H) 09/13/2018 0407    CREATININE 0.65 09/13/2018 0407   GLUCOSE 137 (H) 09/13/2018 0407   CALCIUM 8.4 (L) 09/13/2018 0407   AST 26 09/12/2018 0428   ALT 43 09/12/2018 0428   ALKPHOS 122 09/12/2018 0428   BILITOT 0.4 09/12/2018 0428   PROT 5.4 (L) 09/12/2018 0428   ALBUMIN 2.2 (L) 09/12/2018 0428    RADIOGRAPHIC STUDIES: Dg Chest Portable 1 View  Result Date: 09/10/2018 CLINICAL DATA:  Cough and fever. EXAM: PORTABLE CHEST 1 VIEW COMPARISON:  None. FINDINGS: The heart size and mediastinal contours are within normal limits. Normal pulmonary vascularity. The lungs are hyperinflated with emphysematous changes and coarsened interstitial markings. Patchy reticulonodular densities in the right upper and lower lobes. No pleural effusion or pneumothorax. No acute osseous abnormality. IMPRESSION: 1. COPD. Patchy reticulonodular densities in the right upper and lower lobes likely reflect superimposed infection or inflammation. Followup to resolution is recommended. Electronically Signed   By: Obie Dredge M.D.   On: 09/10/2018 14:32    Review of Systems As noted above. Otherwise, a complete review of systems is negative.  Physical Exam General: thin, frail appearing Cardiovascular: regular rate and rhythm Pulmonary: clear ant fields, on O2 Abdomen: soft, nontender, + bowel sounds Extremities: no edema Skin: no rashes Neurological: alert, pleasantly confused  IMPRESSION: Patient continues to improve. Eating well. Appears to have eaten 100% of breakfast. Note that she was either unable or unwilling to work with PT. Would recommend home health evaluation at ALF. I would also recommend that patient be followed outpatient by palliative care. I spoke with son today by phone and he would agree with this plan. Hospice could be considered in the future if patient does not do well.   Case discussed with attending.   PLAN: Continue medical treatment Recommend ALF with home health and community palliative care   Time  Total: 25 minutes  Visit consisted of counseling and education dealing with the complex and emotionally intense issues of symptom management and palliative care in the setting of serious and potentially life-threatening illness.Greater than 50%  of this time was spent counseling and coordinating care related to the above assessment and plan.  Signed by: Laurette Schimke, PhD, DNP, NP-C, [facility] [phone] (Work Cell)

## 2018-09-14 LAB — GLUCOSE, CAPILLARY
GLUCOSE-CAPILLARY: 93 mg/dL (ref 70–99)
Glucose-Capillary: 120 mg/dL — ABNORMAL HIGH (ref 70–99)

## 2018-09-14 LAB — CBC
HEMATOCRIT: 39.7 % (ref 36.0–46.0)
HEMOGLOBIN: 12.3 g/dL (ref 12.0–15.0)
MCH: 30.9 pg (ref 26.0–34.0)
MCHC: 31 g/dL (ref 30.0–36.0)
MCV: 99.7 fL (ref 80.0–100.0)
Platelets: 661 10*3/uL — ABNORMAL HIGH (ref 150–400)
RBC: 3.98 MIL/uL (ref 3.87–5.11)
RDW: 13.3 % (ref 11.5–15.5)
WBC: 20 10*3/uL — AB (ref 4.0–10.5)
nRBC: 0 % (ref 0.0–0.2)

## 2018-09-14 LAB — BASIC METABOLIC PANEL
Anion gap: 9 (ref 5–15)
BUN: 23 mg/dL (ref 8–23)
CHLORIDE: 103 mmol/L (ref 98–111)
CO2: 31 mmol/L (ref 22–32)
Calcium: 8.6 mg/dL — ABNORMAL LOW (ref 8.9–10.3)
Creatinine, Ser: 0.63 mg/dL (ref 0.44–1.00)
GFR calc Af Amer: >60 mL/min (ref >60)
GFR calc non Af Amer: >60 mL/min (ref >60)
GLUCOSE: 104 mg/dL — AB (ref 70–99)
Potassium: 3.9 mmol/L (ref 3.5–5.1)
SODIUM: 143 mmol/L (ref 135–145)

## 2018-09-14 MED ORDER — IPRATROPIUM-ALBUTEROL 0.5-2.5 (3) MG/3ML IN SOLN: 3.0000 mL | Freq: Four times a day (QID) | RESPIRATORY_TRACT | 0 refills | Status: DC | PRN

## 2018-09-14 MED ORDER — METHIMAZOLE 5 MG PO TABS: 5.0000 mg | ORAL_TABLET | Freq: Every day | ORAL | 0 refills | Status: AC

## 2018-09-14 MED ORDER — AMOXICILLIN-POT CLAVULANATE 875-125 MG PO TABS: 1.0000 | ORAL_TABLET | Freq: Two times a day (BID) | ORAL | 0 refills | Status: AC

## 2018-09-14 MED ORDER — AMOXICILLIN-POT CLAVULANATE 875-125 MG PO TABS: 1.0000 | ORAL_TABLET | Freq: Two times a day (BID) | ORAL | 0 refills | Status: DC

## 2018-09-14 MED ORDER — GUAIFENESIN ER 600 MG PO TB12: 600.0000 mg | ORAL_TABLET | Freq: Two times a day (BID) | ORAL | 0 refills | Status: AC

## 2018-09-14 MED ORDER — PREDNISONE 20 MG PO TABS: 40.0000 mg | ORAL_TABLET | Freq: Every day | ORAL | 0 refills | Status: AC

## 2018-09-14 NOTE — Progress Notes (Signed)
Physical Therapy Treatment Patient Details Name: Shelly Duran MRN: [id number] DOB: 06/05/1941 Today's Date: 09/14/2018    History of Present Illness 77 y.o. female with a known history of Alzheimer's dementia who just moved into an assisted living facility brought in secondary to worsening cough, fever and congestion    PT Comments    Pt asleep in bed, had removed O2.  Sats 84% on room air.  O2 reapplied 2 lpm and slowly increased to 88/89%.  She agreed to try sitting in the recliner at bedside.  Bed mobility and transfer with min a x 1.  Sats remained at 88/89% in chair.  Deferred further mobility due to O2 sats.  Chair alarm on.    Follow Up Recommendations  SNF     Equipment Recommendations       Recommendations for Other Services       Precautions / Restrictions Precautions Precautions: Fall Restrictions Weight Bearing Restrictions: No    Mobility  Bed Mobility Overal bed mobility: Needs Assistance Bed Mobility: Supine to Sit     Supine to sit: Min assist        Transfers Overall transfer level: Needs assistance Equipment used: 1 person hand held assist Transfers: Stand Pivot Transfers   Stand pivot transfers: Min assist          Ambulation/Gait                 Stairs             Wheelchair Mobility    Modified Rankin (Stroke Patients Only)       Balance Overall balance assessment: Needs assistance Sitting-balance support: Feet supported;Single extremity supported Sitting balance-Leahy Scale: Fair     Standing balance support: Bilateral upper extremity supported Standing balance-Leahy Scale: Poor                              Cognition Arousal/Alertness: Lethargic Behavior During Therapy: WFL for tasks assessed/performed Overall Cognitive Status: History of cognitive impairments - at baseline                                        Exercises      General Comments        Pertinent  Vitals/Pain Pain Assessment: No/denies pain    Home Living                      Prior Function            PT Goals (current goals can now be found in the care plan section)      Frequency    Min 2X/week      PT Plan Current plan remains appropriate    Co-evaluation              AM-PAC PT "6 Clicks" Daily Activity  Outcome Measure  Difficulty turning over in bed (including adjusting bedclothes, sheets and blankets)?: Unable Difficulty moving from lying on back to sitting on the side of the bed? : Unable   Help needed moving to and from a bed to chair (including a wheelchair)?: A Little Help needed walking in hospital room?: A Lot Help needed climbing 3-5 steps with a railing? : A Lot 6 Click Score: 9    End of Session Equipment Utilized During Treatment: Gait belt Activity Tolerance: Patient tolerated treatment  well;Treatment limited secondary to medical complications (Comment) Patient left: in chair;with call bell/phone within reach;with chair alarm set         Time: 1610-9604 PT Time Calculation (min) (ACUTE ONLY): 12 min  Charges:  $Therapeutic Activity: 8-22 mins                     Danielle Dess, PTA 09/14/18, 9:19 AM

## 2018-09-14 NOTE — Care Management (Signed)
Patient returning to [facility] today.  Home health orders have been written for home health RN and PT.  Son in agreement to services.  States that he does not have a preference of home health agency, and would like to use who [facility] prefers.  Per [facility]Janice at [facility].  Referral made to [facility].  Clydie BraunKaren with [facility] aware of outpatient palliative referral . RNCM signing off

## 2018-09-14 NOTE — Progress Notes (Addendum)
New referral for outpatient Palliative to [facility] at [facility] received from palliative NP Josh Borders. CMRN Bevelyn NgoStephanie Bowen and CSW York SpanielMonica Marra made aware. Plan is for discharge today with [facility] home health. Patient information faxed to referral.  Dayna BarkerKaren Robertson RN, BSN, [facility], [facility] Liaison [phone]

## 2018-09-14 NOTE — Progress Notes (Signed)
SATURATION QUALIFICATIONS: (This note is used to comply with regulatory documentation for home oxygen)  Patient Saturations on Room Air at Rest = 94%  Patient Saturations on Room Air with exertion = 85%  Patient Saturations on 2 Liters of oxygen with exertion = 92%  Please briefly explain why patient needs home oxygen:  Pt desat to 85% on room air with exertion, required 02 to improve 02 sat above 90%.  Bradly Chrisougherty, Rekisha Welling E, RN

## 2018-09-14 NOTE — Clinical Social Work Note (Signed)
Patient discharging to return to [facility] today. Liborio NixonJanice with [facility] is going to come this afternoon to pick her up. Discharge information faxed at 11:15 this morning.  York SpanielMonica Vann Okerlund MSW,LCSW [phone]

## 2018-09-14 NOTE — Progress Notes (Signed)
Palliative Medicine [facility]  [phone] [phone]   Name: Shelly NorrieDiane Cedillo Date: 09/14/2018 MRN: [id number]  DOB: 05/31/1941  Patient Care Team: Patient, No Pcp Per as PCP - General (General Practice)    REASON FOR CONSULTATION: Palliative Care consult requested for this 77 y.o. female with multiple medical problems including Alzheimer's dementia, who was admitted on 09/10/18 with multifocal pneumonia. Palliative care was consulted to help address goals.   CODE STATUS: DNR  PAST MEDICAL HISTORY: Past Medical History:  Diagnosis Date  . Dementia (HCC)     PAST SURGICAL HISTORY: History reviewed. No pertinent surgical history.  HEMATOLOGY/ONCOLOGY HISTORY:   No history exists.    ALLERGIES:  has No Known Allergies.  MEDICATIONS:  Current Facility-Administered Medications  Medication Dose Route Frequency Provider Last Rate Last Dose  . acetaminophen (TYLENOL) tablet 650 mg  650 mg Oral Q6H PRN Enid BaasKalisetti, Radhika, MD       Or  . acetaminophen (TYLENOL) suppository 650 mg  650 mg Rectal Q6H PRN Enid BaasKalisetti, Radhika, MD      . aspirin EC tablet 81 mg  81 mg Oral Daily Uvaldo RisingSamaan, Maged, MD   81 mg at 09/13/18 0817  . budesonide (PULMICORT) nebulizer solution 0.5 mg  0.5 mg Nebulization BID Erin FullingKasa, Kurian, MD   0.5 mg at 09/14/18 0913  . ceFEPIme (MAXIPIME) 2 g in sodium chloride 0.9 % 100 mL IVPB  2 g Intravenous Q24H Hallaji, Sheema M, RPH 200 mL/hr at 09/14/18 0444 2 g at 09/14/18 0444  . chlorhexidine (PERIDEX) 0.12 % solution 15 mL  15 mL Mouth Rinse BID Erin FullingKasa, Kurian, MD   15 mL at 09/13/18 2121  . enoxaparin (LOVENOX) injection 30 mg  30 mg Subcutaneous Q24H Laureen Ochsllington, Abby K, RPH   30 mg at 09/13/18 1712  . famotidine (PEPCID) tablet 20 mg  20 mg Oral Daily Uvaldo RisingSamaan, Maged, MD   20 mg at 09/13/18 0817  . guaiFENesin (MUCINEX) 12 hr tablet 600 mg  600 mg Oral BID Enid BaasKalisetti, Radhika, MD   600 mg at 09/13/18 2121  . insulin aspart  (novoLOG) injection 0-5 Units  0-5 Units Subcutaneous QHS Samaan, Maged, MD      . insulin aspart (novoLOG) injection 0-9 Units  0-9 Units Subcutaneous TID WC Uvaldo RisingSamaan, Maged, MD   1 Units at 09/13/18 1712  . ipratropium-albuterol (DUONEB) 0.5-2.5 (3) MG/3ML nebulizer solution 3 mL  3 mL Nebulization TID Enid BaasKalisetti, Radhika, MD   3 mL at 09/14/18 0914  . MEDLINE mouth rinse  15 mL Mouth Rinse q12n4p Erin FullingKasa, Kurian, MD   15 mL at 09/13/18 1537  . methimazole (TAPAZOLE) tablet 5 mg  5 mg Oral Daily Enid BaasKalisetti, Radhika, MD   5 mg at 09/13/18 0818  . ondansetron (ZOFRAN) tablet 4 mg  4 mg Oral Q6H PRN Enid BaasKalisetti, Radhika, MD       Or  . ondansetron (ZOFRAN) injection 4 mg  4 mg Intravenous Q6H PRN Enid BaasKalisetti, Radhika, MD      . predniSONE (DELTASONE) tablet 40 mg  40 mg Oral Q breakfast Enid BaasKalisetti, Radhika, MD      . risperiDONE (RISPERDAL) tablet 1 mg  1 mg Oral BID Enid BaasKalisetti, Radhika, MD   1 mg at 09/13/18 2121    VITAL SIGNS: BP 135/85   Pulse 80   Temp 98.2 F (36.8 C)   Resp 18   Ht 4\' 10"  (1.473 m)   Wt 89 lb 11.6 oz (40.7 kg)   SpO2 100%  BMI 18.75 kg/m  Filed Weights   09/10/18 1343 09/10/18 1652  Weight: 100 lb (45.4 kg) 89 lb 11.6 oz (40.7 kg)    Estimated body mass index is 18.75 kg/m as calculated from the following:   Height as of this encounter: 4\' 10"  (1.473 m).   Weight as of this encounter: 89 lb 11.6 oz (40.7 kg).  LABS: CBC:    Component Value Date/Time   WBC 24.0 (H) 09/13/2018 0407   HGB 10.4 (L) 09/13/2018 0407   HCT 33.2 (L) 09/13/2018 0407   PLT 601 (H) 09/13/2018 0407   MCV 99.7 09/13/2018 0407   NEUTROABS 26.7 (H) 09/12/2018 0428   LYMPHSABS 0.4 (L) 09/12/2018 0428   MONOABS 0.7 09/12/2018 0428   EOSABS 0.0 09/12/2018 0428   BASOSABS 0.1 09/12/2018 0428   Comprehensive Metabolic Panel:    Component Value Date/Time   NA 145 09/13/2018 0407   K 3.9 09/13/2018 0407   CL 112 (H) 09/13/2018 0407   CO2 29 09/13/2018 0407   BUN 29 (H) 09/13/2018 0407    CREATININE 0.65 09/13/2018 0407   GLUCOSE 137 (H) 09/13/2018 0407   CALCIUM 8.4 (L) 09/13/2018 0407   AST 26 09/12/2018 0428   ALT 43 09/12/2018 0428   ALKPHOS 122 09/12/2018 0428   BILITOT 0.4 09/12/2018 0428   PROT 5.4 (L) 09/12/2018 0428   ALBUMIN 2.2 (L) 09/12/2018 0428    RADIOGRAPHIC STUDIES: Dg Chest 2 View  Result Date: 09/13/2018 CLINICAL DATA:  COPD, follow-up pneumonia.  Current smoker. EXAM: CHEST - 2 VIEW COMPARISON:  Portable chest x-ray of September 10, 2018. FINDINGS: The lungs are hyperinflated. There are moderate-sized pleural effusions layering posteriorly, bilaterally. The interstitial markings of both lungs remain increased. Patchy increased density in the right upper and lower lung is somewhat less conspicuous today. There is no discrete infiltrate. The heart is top-normal in size. The pulmonary vascularity is normal. There are calcifications in the wall of the aortic arch. The bony thorax exhibits no acute abnormality. IMPRESSION: COPD. Moderate-sized bilateral pleural effusions which layer posteriorly. Persistently increased lung markings bilaterally compatible with chronic bronchitis-smoking related changes. Subtle areas of confluent density in the right lung are fairly stable and may reflect pneumonia. Electronically Signed   By: David  [place] M.D.   On: 09/13/2018 12:40   Dg Chest Portable 1 View  Result Date: 09/10/2018 CLINICAL DATA:  Cough and fever. EXAM: PORTABLE CHEST 1 VIEW COMPARISON:  None. FINDINGS: The heart size and mediastinal contours are within normal limits. Normal pulmonary vascularity. The lungs are hyperinflated with emphysematous changes and coarsened interstitial markings. Patchy reticulonodular densities in the right upper and lower lobes. No pleural effusion or pneumothorax. No acute osseous abnormality. IMPRESSION: 1. COPD. Patchy reticulonodular densities in the right upper and lower lobes likely reflect superimposed infection or inflammation.  Followup to resolution is recommended. Electronically Signed   By: Obie Dredge M.D.   On: 09/10/2018 14:32    Review of Systems As noted above. Otherwise, a complete review of systems is negative.  Physical Exam General: thin, frail appearing, sitting in chair Cardiovascular: regular rate and rhythm Pulmonary: clear ant fields, on O2 Abdomen: soft, nontender, + bowel sounds Extremities: no edema Skin: no rashes Neurological: alert, pleasantly confused  IMPRESSION: Patient stable appearing. She is sitting in chair without any acute complaints. She is pleasantly confused. I suspect she is at baseline.   Note plan to discharge today back to ALF. She will receive home health services. I have also  placed a consult for palliative care to follow her at ALF.   PLAN: ALF with home health and palliative care   Time Total: 15 minutes  Visit consisted of counseling and education dealing with the complex and emotionally intense issues of symptom management and palliative care in the setting of serious and potentially life-threatening illness.Greater than 50%  of this time was spent counseling and coordinating care related to the above assessment and plan.  Signed by: Laurette Schimke, PhD, DNP, NP-C, [facility] [phone] (Work Cell)

## 2018-09-14 NOTE — Care Management Note (Signed)
Case Management Note  Patient Details  Name: Shelly NorrieDiane Duran MRN: [id number] Date of Birth: 10/03/1941   Update: Patient with qualifying O2 sats for home.  MD has placed order.  Shelly [facility]Janice from [facility] aware.  Shelly Duran with [facility] to deliver portable o2 to room for Shelly NixonJanice to use for transport home.  Shelly NixonJanice is aware that O2 will be delivered to the facility this evening.   Subjective/Objective:                    Action/Plan:   Expected Discharge Date:  09/14/18               Expected Discharge Plan:  Assisted Living / Rest Home(with home health)  In-House Referral:  Clinical Social Work  Discharge planning Services  CM Consult  Post Acute Care Choice:  Home Health Choice offered to:  Adult Children  DME Arranged:  Oxygen DME Agency:  [facility]  HH Arranged:  [facility]  Status of Service:  Completed, signed off  If discussed at Long Length of Stay Meetings, dates discussed:    Additional Comments:  Chapman FitchBOWEN, Eletha Culbertson T, RN 09/14/2018, 3:16 PM

## 2018-09-14 NOTE — Progress Notes (Signed)
09/14/2018 1500  Fayne Norrieiane Wheat to be D/C'd memory care per MD order.  Discussed prescriptions and follow up appointments with the patient. Prescriptions given to patient, medication list explained in detail. Pt verbalized understanding.  Allergies as of 09/14/2018   No Known Allergies     Medication List    TAKE these medications   amoxicillin-clavulanate 875-125 MG tablet Commonly known as:  AUGMENTIN Take 1 tablet by mouth every 12 (twelve) hours for 3 days.   guaiFENesin 600 MG 12 hr tablet Commonly known as:  MUCINEX Take 1 tablet (600 mg total) by mouth 2 (two) times daily for 15 days.   ipratropium-albuterol 0.5-2.5 (3) MG/3ML Soln Commonly known as:  DUONEB Take 3 mLs by nebulization every 6 (six) hours as needed.   methimazole 5 MG tablet Commonly known as:  TAPAZOLE Take 1 tablet (5 mg total) by mouth daily.   predniSONE 20 MG tablet Commonly known as:  DELTASONE Take 2 tablets (40 mg total) by mouth daily for 4 days.   risperiDONE 1 MG tablet Commonly known as:  RISPERDAL Take 1 tablet by mouth. Take 1 tab at bedtime. May repeat after 1 hour if needed for agitation.            Durable Medical Equipment  (From admission, onward)         Start     Ordered   09/14/18 1438  For home use only DME oxygen  Once    Question Answer Comment  Mode or (Route) Nasal cannula   Liters per Minute 2   Frequency Continuous (stationary and portable oxygen unit needed)   Oxygen conserving device Yes   Oxygen delivery system Gas      09/14/18 1437          Vitals:   09/14/18 1447 09/14/18 1501  BP:    Pulse:    Resp:    Temp:    SpO2: 96% 98%    Skin clean, dry and intact without evidence of skin break down, no evidence of skin tears noted. IV catheter discontinued intact. Site without signs and symptoms of complications. Dressing and pressure applied. Pt denies pain at this time. No complaints noted.  An After Visit Summary was printed and given to the  patient. Patient escorted via WC, and D/C home via private auto.  Bradly Chrisougherty, Anita Mcadory E

## 2018-09-14 NOTE — Discharge Summary (Addendum)
[employer] [facility]   PATIENT NAME: Shelly Duran    MR#:  [id number]  DATE OF BIRTH:  May 16, 1941  DATE OF ADMISSION:  09/10/2018   ADMITTING PHYSICIAN: Enid Baas, MD  DATE OF DISCHARGE: 09/14/18  PRIMARY CARE PHYSICIAN: Patient, No Pcp Per   ADMISSION DIAGNOSIS:   Hypernatremia [E87.0] Hypoxia [R09.02] COPD exacerbation (HCC) [J44.1] Multifocal pneumonia [J18.9] Sepsis, due to unspecified organism, unspecified whether acute organ dysfunction present (HCC) [A41.9]  DISCHARGE DIAGNOSIS:   Active Problems:   Sepsis (HCC)   COPD exacerbation (HCC)   Acute respiratory failure (HCC)   Multifocal pneumonia   Palliative care encounter   Protein-calorie malnutrition, severe   SECONDARY DIAGNOSIS:   Past Medical History:  Diagnosis Date  . Dementia [facility])     HOSPITAL COURSE:   DianeLyonsis a77 y.o.femalewith a known history of moderate Alzheimer's dementia who just moved into an assisted living facility brought in secondary to worsening cough, fever and congestion since yesterday.  1. Sepsis-secondary to multifocal pneumonia. No aspiration reported -MRSA PCR negative, -Blood cultures negative so far.  IV cefepime here, discharged on oral Augmentin -White count is  elevated.  Partly from steroids -Follow-up with repeat chest x-ray with basilar pneumonia, improving  2.Acute hypoxic respiratory failure-secondary to multifocal pneumonia -Off BiPAP and currently on 2 L oxygen via nasal cannula, wean as tolerated. -Not on oxygen at the assisted living facility. Chest x-ray confirming the multifocal pneumonia on admission -Encourage incentive spirometry and out of bed -Also has underlying COPD. -Nebulizers as needed, on prednisone for 5 more days  3. Hypernatremia-received IV fluids.  Much improved sodium levels. Hypokalemia- replaced  4. Dementia-seems to be confused at baseline. Might have behavioral issues as  on risperidone -Seems to be at baseline now  5.  Troponin is elevated secondary to demand ischemia.  Plateaued, no further cardiac work-up needed.  Physical therapy consulted.  At baseline ambulates with a walker -Appreciate palliative care input for goals of conversation. Patient will be discharged back to assisted living facility with home health services and palliative care follow-up.  Please see the detailed palliative care notes by [facility]. If condition seems to be declining, son agreeable for hospice services.   DISCHARGE CONDITIONS:   Guarded  CONSULTS OBTAINED:    Palliative Care consult  DRUG ALLERGIES:   No Known Allergies DISCHARGE MEDICATIONS:   Allergies as of 09/14/2018   No Known Allergies     Medication List    TAKE these medications   amoxicillin-clavulanate 875-125 MG tablet Commonly known as:  AUGMENTIN Take 1 tablet by mouth every 12 (twelve) hours for 7 days.   guaiFENesin 600 MG 12 hr tablet Commonly known as:  MUCINEX Take 1 tablet (600 mg total) by mouth 2 (two) times daily for 15 days.   ipratropium-albuterol 0.5-2.5 (3) MG/3ML Soln Commonly known as:  DUONEB Take 3 mLs by nebulization every 6 (six) hours as needed.   methimazole 5 MG tablet Commonly known as:  TAPAZOLE Take 1 tablet (5 mg total) by mouth daily.   predniSONE 20 MG tablet Commonly known as:  DELTASONE Take 2 tablets (40 mg total) by mouth daily for 4 days.   risperiDONE 1 MG tablet Commonly known as:  RISPERDAL Take 1 tablet by mouth. Take 1 tab at bedtime. May repeat after 1 hour if needed for agitation.        DISCHARGE INSTRUCTIONS:   1. PCP f/u in 1 week 2. Home health and palliative  care follow up  DIET:   Regular diet- Dysphagia 1 diet with nectar thick liquids- Extra Gravy on meats, potatoes. Yogurt TID meals. Magic Cup in place of ice creams. Sweet potato puree w/ butter/sugar at lunch meals.  ACTIVITY:   Activity as tolerated  OXYGEN:    Home Oxygen: Yes.    Oxygen Delivery: 2 liters/min via Patient connected to nasal cannula oxygen  DISCHARGE LOCATION:   Assisted Living Facility   If you experience worsening of your admission symptoms, develop shortness of breath, life threatening emergency, suicidal or homicidal thoughts you must seek medical attention immediately by calling 911 or calling your MD immediately  if symptoms less severe.  You Must read complete instructions/literature along with all the possible adverse reactions/side effects for all the Medicines you take and that have been prescribed to you. Take any new Medicines after you have completely understood and accpet all the possible adverse reactions/side effects.   Please note  You were cared for by a hospitalist during your hospital stay. If you have any questions about your discharge medications or the care you received while you were in the hospital after you are discharged, you can call the unit and asked to speak with the hospitalist on call if the hospitalist that took care of you is not available. Once you are discharged, your primary care physician will handle any further medical issues. Please note that NO REFILLS for any discharge medications will be authorized once you are discharged, as it is imperative that you return to your primary care physician (or establish a relationship with a primary care physician if you do not have one) for your aftercare needs so that they can reassess your need for medications and monitor your lab values.    On the day of Discharge:  VITAL SIGNS:   Blood pressure 135/85, pulse 80, temperature 98.2 F (36.8 C), resp. rate 18, height 4\' 10"  (1.473 m), weight 40.7 kg, SpO2 100 %.  PHYSICAL EXAMINATION:   GENERAL:77 y.o.-year-old patient lying in the bed with no acute distress.  Severely malnourished EYES: Pupils equal, round, reactive to light and accommodation. No scleral icterus. Extraocular muscles intact.   HEENT: Head atraumatic, normocephalic. Oropharynx and nasopharynx clear.  NECK: Supple, no jugular venous distention. No thyroid enlargement, no tenderness.  LUNGS: Normal breath sounds bilaterally, no wheezing,coarse rhonchi bilaterally, norales or crepitation. No use of accessory muscles of respiration.  CARDIOVASCULAR: S1, S2 normal. Norubs, or gallops.3/6 systolic murmur present ABDOMEN: Soft, nontender, nondistended. Bowel sounds present. No organomegaly or mass.  EXTREMITIES: No pedal edema, cyanosis, or clubbing.  NEUROLOGIC: Cranial nerves II through XII are intact. Muscle strength 5/5 in all extremities. Sensation intact. Gait not checked.  Following simple commands PSYCHIATRIC: The patient is alert and orientedto self.  SKIN: No obvious rash, lesion, or ulcer.   DATA REVIEW:   CBC Recent Labs  Lab 09/13/18 0407  WBC 24.0*  HGB 10.4*  HCT 33.2*  PLT 601*    Chemistries  Recent Labs  Lab 09/11/18 0404 09/12/18 0428 09/13/18 0407  NA 148* 146* 145  K 3.0* 4.2 3.9  CL 113* 113* 112*  CO2 28 27 29   GLUCOSE 248* 184* 137*  BUN 25* 25* 29*  CREATININE 0.72 0.63 0.65  CALCIUM 8.3* 8.6* 8.4*  MG 2.1  --   --   AST  --  26  --   ALT  --  43  --   ALKPHOS  --  122  --  BILITOT  --  0.4  --      Microbiology Results  Results for orders placed or performed during the hospital encounter of 09/10/18  Culture, blood (Routine x 2)     Status: None (Preliminary result)   Collection Time: 09/10/18  1:57 PM  Result Value Ref Range Status   Specimen Description BLOOD RT [facility]C  Final   Special Requests   Final    BOTTLES DRAWN AEROBIC AND ANAEROBIC Blood Culture adequate volume   Culture   Final    NO GROWTH 4 DAYS Performed at [facility], [address] [place], [place] [address]    Report Status PENDING  Incomplete  Culture, blood (Routine x 2)     Status: None (Preliminary result)   Collection Time: 09/10/18  1:57 PM  Result Value Ref Range  Status   Specimen Description BLOOD LT FA  Final   Special Requests   Final    BOTTLES DRAWN AEROBIC AND ANAEROBIC Blood Culture adequate volume   Culture   Final    NO GROWTH 4 DAYS Performed at [facility], [address] [place], [place] [address]    Report Status PENDING  Incomplete  MRSA PCR Screening     Status: None   Collection Time: 09/10/18  4:56 PM  Result Value Ref Range Status   MRSA by PCR NEGATIVE NEGATIVE Final    Comment:        The GeneXpert MRSA Assay (FDA approved for NASAL specimens only), is one component of a comprehensive MRSA colonization surveillance program. It is not intended to diagnose MRSA infection nor to guide or monitor treatment for MRSA infections. Performed at [facility], [address] [place], [place] [address]     RADIOLOGY:  Dg Chest 2 View  Result Date: 09/13/2018 CLINICAL DATA:  COPD, follow-up pneumonia.  Current smoker. EXAM: CHEST - 2 VIEW COMPARISON:  Portable chest x-ray of September 10, 2018. FINDINGS: The lungs are hyperinflated. There are moderate-sized pleural effusions layering posteriorly, bilaterally. The interstitial markings of both lungs remain increased. Patchy increased density in the right upper and lower lung is somewhat less conspicuous today. There is no discrete infiltrate. The heart is top-normal in size. The pulmonary vascularity is normal. There are calcifications in the wall of the aortic arch. The bony thorax exhibits no acute abnormality. IMPRESSION: COPD. Moderate-sized bilateral pleural effusions which layer posteriorly. Persistently increased lung markings bilaterally compatible with chronic bronchitis-smoking related changes. Subtle areas of confluent density in the right lung are fairly stable and may reflect pneumonia. Electronically Signed   By: David  Swazil[place]Jordan M.D.   On: 09/13/2018 12:40     Management plans discussed with the patient, family and they are in agreement.  CODE  STATUS:     Code Status Orders  (From admission, onward)         Start     Ordered   09/11/18 1631  Do not attempt resuscitation (DNR)  Continuous    Question Answer Comment  In the event of cardiac or respiratory ARREST Do not call a "code blue"   In the event of cardiac or respiratory ARREST Do not perform Intubation, CPR, defibrillation or ACLS   In the event of cardiac or respiratory ARREST Use medication by any route, position, wound care, and other measures to relive pain and suffering. May use oxygen, suction and manual treatment of airway obstruction as needed for comfort.      09/11/18 1630  Code Status History    Date Active Date Inactive Code Status Order ID Comments User Context   09/10/2018 1547 09/11/2018 1630 Full Code [id number]  Enid Baas, MD ED      TOTAL TIME TAKING CARE OF THIS PATIENT: 38 minutes.    Enid Baas M.D on 09/14/2018 at 9:19 AM  Between 7am to 6pm - Pager - [phone]  After 6pm go to [email or web address] - Social research officer, government  [employer]  Hospitalists  Office  [phone]  CC: Primary care physician; Patient, No Pcp Per   Note: This dictation was prepared with Dragon dictation along with smaller phrase technology. Any transcriptional errors that result from this process are unintentional.

## 2018-09-15 LAB — CULTURE, BLOOD (ROUTINE X 2)
CULTURE: NO GROWTH
Culture: NO GROWTH
SPECIAL REQUESTS: ADEQUATE
SPECIAL REQUESTS: ADEQUATE

## 2018-10-28 ENCOUNTER — Inpatient Hospital Stay
Admission: EM | Admit: 2018-10-28 | Discharge: 2018-11-01 | DRG: 871 | Disposition: A | Discharge status: Skilled Nursing Facility | Payer: Medicare Other | Attending: Internal Medicine | Admitting: Internal Medicine

## 2018-10-28 ENCOUNTER — Emergency Department: Payer: Medicare Other

## 2018-10-28 ENCOUNTER — Other Ambulatory Visit: Payer: Self-pay

## 2018-10-28 DIAGNOSIS — E43 Unspecified severe protein-calorie malnutrition: Secondary | ICD-10-CM | POA: Diagnosis present

## 2018-10-28 DIAGNOSIS — A419 Sepsis, unspecified organism: Secondary | ICD-10-CM | POA: Diagnosis not present

## 2018-10-28 DIAGNOSIS — Y95 Nosocomial condition: Secondary | ICD-10-CM | POA: Diagnosis present

## 2018-10-28 DIAGNOSIS — D638 Anemia in other chronic diseases classified elsewhere: Secondary | ICD-10-CM | POA: Diagnosis present

## 2018-10-28 DIAGNOSIS — G309 Alzheimer's disease, unspecified: Secondary | ICD-10-CM | POA: Diagnosis present

## 2018-10-28 DIAGNOSIS — E876 Hypokalemia: Secondary | ICD-10-CM | POA: Diagnosis present

## 2018-10-28 DIAGNOSIS — R627 Adult failure to thrive: Secondary | ICD-10-CM | POA: Diagnosis present

## 2018-10-28 DIAGNOSIS — I493 Ventricular premature depolarization: Secondary | ICD-10-CM | POA: Diagnosis present

## 2018-10-28 DIAGNOSIS — Z79899 Other long term (current) drug therapy: Secondary | ICD-10-CM

## 2018-10-28 DIAGNOSIS — J189 Pneumonia, unspecified organism: Secondary | ICD-10-CM | POA: Diagnosis present

## 2018-10-28 DIAGNOSIS — E041 Nontoxic single thyroid nodule: Secondary | ICD-10-CM

## 2018-10-28 DIAGNOSIS — J9621 Acute and chronic respiratory failure with hypoxia: Secondary | ICD-10-CM | POA: Diagnosis present

## 2018-10-28 DIAGNOSIS — E0520 Thyrotoxicosis with toxic multinodular goiter without thyrotoxic crisis or storm: Secondary | ICD-10-CM | POA: Diagnosis present

## 2018-10-28 DIAGNOSIS — E778 Other disorders of glycoprotein metabolism: Secondary | ICD-10-CM | POA: Diagnosis present

## 2018-10-28 DIAGNOSIS — Z8701 Personal history of pneumonia (recurrent): Secondary | ICD-10-CM | POA: Diagnosis not present

## 2018-10-28 DIAGNOSIS — R740 Nonspecific elevation of levels of transaminase and lactic acid dehydrogenase [LDH]: Secondary | ICD-10-CM | POA: Diagnosis present

## 2018-10-28 DIAGNOSIS — Z23 Encounter for immunization: Secondary | ICD-10-CM

## 2018-10-28 DIAGNOSIS — Z66 Do not resuscitate: Secondary | ICD-10-CM | POA: Diagnosis present

## 2018-10-28 DIAGNOSIS — J440 Chronic obstructive pulmonary disease with acute lower respiratory infection: Secondary | ICD-10-CM | POA: Diagnosis present

## 2018-10-28 DIAGNOSIS — R945 Abnormal results of liver function studies: Secondary | ICD-10-CM | POA: Diagnosis not present

## 2018-10-28 DIAGNOSIS — F17200 Nicotine dependence, unspecified, uncomplicated: Secondary | ICD-10-CM | POA: Diagnosis present

## 2018-10-28 DIAGNOSIS — R7989 Other specified abnormal findings of blood chemistry: Secondary | ICD-10-CM

## 2018-10-28 DIAGNOSIS — Z681 Body mass index (BMI) 19 or less, adult: Secondary | ICD-10-CM | POA: Diagnosis not present

## 2018-10-28 DIAGNOSIS — Z751 Person awaiting admission to adequate facility elsewhere: Secondary | ICD-10-CM

## 2018-10-28 DIAGNOSIS — F0280 Dementia in other diseases classified elsewhere without behavioral disturbance: Secondary | ICD-10-CM | POA: Diagnosis present

## 2018-10-28 HISTORY — DX: Dementia in other diseases classified elsewhere, unspecified severity, without behavioral disturbance, psychotic disturbance, mood disturbance, and anxiety: F02.80

## 2018-10-28 HISTORY — DX: Alzheimer's disease, unspecified: G30.9

## 2018-10-28 LAB — URINALYSIS, COMPLETE (UACMP) WITH MICROSCOPIC
Bacteria, UA: NONE SEEN
Bilirubin Urine: NEGATIVE
Glucose, UA: 50 mg/dL — AB
Hgb urine dipstick: NEGATIVE
Ketones, ur: NEGATIVE mg/dL
Leukocytes, UA: NEGATIVE
NITRITE: NEGATIVE
PH: 5 (ref 5.0–8.0)
Protein, ur: 30 mg/dL — AB
Specific Gravity, Urine: 1.024 (ref 1.005–1.030)
Squamous Epithelial / HPF: NONE SEEN (ref 0–5)

## 2018-10-28 LAB — PROTIME-INR
INR: 0.88
Prothrombin Time: 11.9 seconds (ref 11.4–15.2)

## 2018-10-28 LAB — CBC
HCT: 37.2 % (ref 36.0–46.0)
Hemoglobin: 11.4 g/dL — ABNORMAL LOW (ref 12.0–15.0)
MCH: 30.2 pg (ref 26.0–34.0)
MCHC: 30.6 g/dL (ref 30.0–36.0)
MCV: 98.4 fL (ref 80.0–100.0)
NRBC: 0 % (ref 0.0–0.2)
PLATELETS: 433 10*3/uL — AB (ref 150–400)
RBC: 3.78 MIL/uL — AB (ref 3.87–5.11)
RDW: 14.9 % (ref 11.5–15.5)
WBC: 11.4 10*3/uL — AB (ref 4.0–10.5)

## 2018-10-28 LAB — COMPREHENSIVE METABOLIC PANEL
ALT: 50 U/L — ABNORMAL HIGH (ref 0–44)
AST: 53 U/L — ABNORMAL HIGH (ref 15–41)
Albumin: 2.6 g/dL — ABNORMAL LOW (ref 3.5–5.0)
Alkaline Phosphatase: 192 U/L — ABNORMAL HIGH (ref 38–126)
Anion gap: 7 (ref 5–15)
BUN: 18 mg/dL (ref 8–23)
CO2: 28 mmol/L (ref 22–32)
Calcium: 8 mg/dL — ABNORMAL LOW (ref 8.9–10.3)
Chloride: 109 mmol/L (ref 98–111)
Creatinine, Ser: 0.66 mg/dL (ref 0.44–1.00)
GFR calc Af Amer: >60 mL/min (ref >60)
GFR calc non Af Amer: >60 mL/min (ref >60)
Glucose, Bld: 112 mg/dL — ABNORMAL HIGH (ref 70–99)
Potassium: 3.2 mmol/L — ABNORMAL LOW (ref 3.5–5.1)
SODIUM: 144 mmol/L (ref 135–145)
Total Bilirubin: 0.7 mg/dL (ref 0.3–1.2)
Total Protein: 6 g/dL — ABNORMAL LOW (ref 6.5–8.1)

## 2018-10-28 LAB — PREALBUMIN: Prealbumin: 7.3 mg/dL — ABNORMAL LOW (ref 18–38)

## 2018-10-28 LAB — INFLUENZA PANEL BY PCR (TYPE A & B)
INFLAPCR: NEGATIVE
Influenza B By PCR: NEGATIVE

## 2018-10-28 LAB — PHOSPHORUS: Phosphorus: 3.1 mg/dL (ref 2.5–4.6)

## 2018-10-28 LAB — CG4 I-STAT (LACTIC ACID): Lactic Acid, Venous: 1.49 mmol/L (ref 0.5–1.9)

## 2018-10-28 LAB — LIPASE, BLOOD: Lipase: 33 U/L (ref 11–51)

## 2018-10-28 LAB — MAGNESIUM: Magnesium: 1.9 mg/dL (ref 1.7–2.4)

## 2018-10-28 LAB — TROPONIN I: Troponin I: <0.03 ng/mL (ref <0.03)

## 2018-10-28 LAB — MRSA PCR SCREENING: MRSA by PCR: NEGATIVE

## 2018-10-28 LAB — PROCALCITONIN: Procalcitonin: 0.17 ng/mL

## 2018-10-28 LAB — BRAIN NATRIURETIC PEPTIDE: B Natriuretic Peptide: 125 pg/mL — ABNORMAL HIGH (ref 0.0–100.0)

## 2018-10-28 MED ORDER — VANCOMYCIN HCL 500 MG IV SOLR
500.0000 mg | INTRAVENOUS | Status: DC
  Administered 2018-10-29 – 2018-10-30 (×2): 500 mg via INTRAVENOUS
  Filled 2018-10-28 (×3): qty 500

## 2018-10-28 MED ORDER — PNEUMOCOCCAL VAC POLYVALENT 25 MCG/0.5ML IJ INJ
0.5000 mL | INJECTION | INTRAMUSCULAR | Status: AC
  Administered 2018-10-30: 0.5 mL via INTRAMUSCULAR
  Filled 2018-10-28: qty 0.5

## 2018-10-28 MED ORDER — BISACODYL 5 MG PO TBEC: 5.0000 mg | DELAYED_RELEASE_TABLET | Freq: Every day | ORAL | Status: DC | PRN

## 2018-10-28 MED ORDER — LACTATED RINGERS IV SOLN
INTRAVENOUS | Status: AC
  Administered 2018-10-28: 07:00:00 via INTRAVENOUS

## 2018-10-28 MED ORDER — ACETAMINOPHEN 650 MG RE SUPP: 650.0000 mg | Freq: Four times a day (QID) | RECTAL | Status: DC | PRN

## 2018-10-28 MED ORDER — ENOXAPARIN SODIUM 40 MG/0.4ML ~~LOC~~ SOLN: 40.0000 mg | SUBCUTANEOUS | Status: DC

## 2018-10-28 MED ORDER — POTASSIUM CHLORIDE 20 MEQ PO PACK
40.0000 meq | PACK | Freq: Two times a day (BID) | ORAL | Status: AC
  Administered 2018-10-28 (×2): 40 meq via ORAL
  Filled 2018-10-28 (×2): qty 2

## 2018-10-28 MED ORDER — ONDANSETRON HCL 4 MG PO TABS: 4.0000 mg | ORAL_TABLET | Freq: Four times a day (QID) | ORAL | Status: DC | PRN

## 2018-10-28 MED ORDER — ACETAMINOPHEN 325 MG PO TABS: 650.0000 mg | ORAL_TABLET | Freq: Four times a day (QID) | ORAL | Status: DC | PRN

## 2018-10-28 MED ORDER — SENNOSIDES-DOCUSATE SODIUM 8.6-50 MG PO TABS: 1.0000 | ORAL_TABLET | Freq: Every evening | ORAL | Status: DC | PRN

## 2018-10-28 MED ORDER — ONDANSETRON HCL 4 MG/2ML IJ SOLN: 4.0000 mg | Freq: Four times a day (QID) | INTRAMUSCULAR | Status: DC | PRN

## 2018-10-28 MED ORDER — ORAL CARE MOUTH RINSE
15.0000 mL | Freq: Two times a day (BID) | OROMUCOSAL | Status: DC
  Administered 2018-10-29 – 2018-11-01 (×7): 15 mL via OROMUCOSAL

## 2018-10-28 MED ORDER — RISPERIDONE 1 MG PO TABS
1.0000 mg | ORAL_TABLET | Freq: Two times a day (BID) | ORAL | Status: DC
  Administered 2018-10-28 – 2018-10-31 (×8): 1 mg via ORAL
  Filled 2018-10-28 (×10): qty 1

## 2018-10-28 MED ORDER — PIPERACILLIN-TAZOBACTAM 3.375 G IVPB 30 MIN
3.3750 g | Freq: Once | INTRAVENOUS | Status: AC
  Administered 2018-10-28: 3.375 g via INTRAVENOUS
  Filled 2018-10-28: qty 50

## 2018-10-28 MED ORDER — PIPERACILLIN-TAZOBACTAM 3.375 G IVPB
3.3750 g | Freq: Three times a day (TID) | INTRAVENOUS | Status: DC
  Administered 2018-10-28 – 2018-10-30 (×7): 3.375 g via INTRAVENOUS
  Filled 2018-10-28 (×7): qty 50

## 2018-10-28 MED ORDER — VANCOMYCIN HCL IN DEXTROSE 1-5 GM/200ML-% IV SOLN
1000.0000 mg | Freq: Once | INTRAVENOUS | Status: AC
  Administered 2018-10-28: 1000 mg via INTRAVENOUS
  Filled 2018-10-28: qty 200

## 2018-10-28 MED ORDER — IPRATROPIUM-ALBUTEROL 0.5-2.5 (3) MG/3ML IN SOLN
3.0000 mL | Freq: Four times a day (QID) | RESPIRATORY_TRACT | Status: DC
  Administered 2018-10-28 – 2018-10-29 (×2): 3 mL via RESPIRATORY_TRACT
  Filled 2018-10-28 (×4): qty 3

## 2018-10-28 MED ORDER — METHIMAZOLE 5 MG PO TABS
5.0000 mg | ORAL_TABLET | Freq: Every day | ORAL | Status: DC
  Administered 2018-10-28 – 2018-11-01 (×5): 5 mg via ORAL
  Filled 2018-10-28 (×5): qty 1

## 2018-10-28 MED ORDER — ENOXAPARIN SODIUM 30 MG/0.3ML ~~LOC~~ SOLN
30.0000 mg | SUBCUTANEOUS | Status: DC
  Administered 2018-10-28 – 2018-10-31 (×4): 30 mg via SUBCUTANEOUS
  Filled 2018-10-28 (×4): qty 0.3

## 2018-10-28 NOTE — ED Notes (Signed)
Safety sitter placed at bedside as patient is pulling off all medical equipment.

## 2018-10-28 NOTE — ED Notes (Signed)
Patient taken off board/discharged in error, returned to room 4. Patient returned from CT.

## 2018-10-28 NOTE — ED Notes (Signed)
Patient placed on bed alarm.

## 2018-10-28 NOTE — Progress Notes (Signed)
Pharmacy Antibiotic Note  Shelly Duran is a 78 y.o. female admitted on 10/28/2018 with sepsis.  Pharmacy has been consulted for Vancomycin and Zosyn dosing.  Ke= 0.035 T1/2=20 hr Vd=27 L  Plan: Vancomycin 500 IV every 24 hours.  Goal trough 15-20 mcg/mL. Zosyn 3.375g IV q8h (4 hour infusion). Will check a Vancomycin trough level prior to 5th dose  Height: 5\' 4"  (162.6 cm) Weight: 85 lb 1.6 oz (38.6 kg) IBW/kg (Calculated) : 54.7  Temp (24hrs), Avg:98.3 F (36.8 C), Min:98.3 F (36.8 C), Max:98.3 F (36.8 C)  Recent Labs  Lab 10/28/18 0307 10/28/18 0334  WBC 11.4*  --   CREATININE  --  0.66  LATICACIDVEN  --  1.49    Estimated Creatinine Clearance: 37 mL/min (by C-G formula based on SCr of 0.66 mg/dL).    No Known Allergies  Antimicrobials this admission: Vancomycin 1/4 >>  Zosyn 1/4 >>   Thank you for allowing pharmacy to be a part of this patient's care.  Clovia CuffLisa Jameila Keeny, PharmD, BCPS 10/28/2018 7:15 AM

## 2018-10-28 NOTE — ED Provider Notes (Signed)
[facility] Emergency Department Provider Note  ____________________________________________   First MD Initiated Contact with Patient 10/28/18 [phone]     (approximate)  I have reviewed the triage vital signs and the nursing notes.   HISTORY  Chief Complaint Respiratory Distress  Level 5 caveat:  history/ROS limited by chronic dementia  HPI Shelly Duran is a 78 y.o. female with advanced dementia who presents for evaluation of shortness of breath.  Reportedly she came from [facility] assisted [facility] after telling them that she was having trouble breathing.  According to staff at the facility the patient's level of consciousness is decreased from normal.  The patient was 88 to 89% on room air prior to arrival and after she arrived to the emergency department.  On 3 L by nasal cannula she is in the low 90s.  She does not appear to be in any distress.  She is not able to provide any history but she denies feeling short of breath and denies any pain at this time.  Past Medical History:  Diagnosis Date  . Alzheimer's dementia (HCC)   . Dementia [facility])     Patient Active Problem List   Diagnosis Date Noted  . Sepsis due to pneumonia (HCC) 10/28/2018  . Protein-calorie malnutrition, severe 09/12/2018  . COPD exacerbation (HCC)   . Acute respiratory failure (HCC)   . Multifocal pneumonia   . Palliative care encounter   . Sepsis (HCC) 09/10/2018  . 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 ELEVATED BP READING WITHOUT DX HYPERTENSION 03/10/2010    History reviewed. No pertinent surgical history.  Prior to Admission medications   Medication Sig Start Date End Date Taking? Authorizing Provider  cholecalciferol (VITAMIN D3) 25 MCG (1000 UT) tablet Take 1,000 Units by mouth daily.   Yes [provider]  cyanocobalamin (,VITAMIN B-12,) 1000 MCG/ML injection Inject 1,000 mcg into the muscle every 30 (thirty) days.   Yes [provider]    ipratropium-albuterol (DUONEB) 0.5-2.5 (3) MG/3ML SOLN Take 3 mLs by nebulization every 6 (six) hours as needed. 09/14/18  Yes Enid Baas, MD  methimazole (TAPAZOLE) 5 MG tablet Take 1 tablet (5 mg total) by mouth daily. 09/14/18  Yes Enid Baas, MD  Phenylephrine-DM-GG (ROBITUSSIN TO GO COUGH/COLD CF) 5-10-100 MG/5ML LIQD Take 10 mLs by mouth every 6 (six) hours as needed (for up to 48 hours).   Yes [provider]  risperiDONE (RISPERDAL) 1 MG tablet Take 1 tablet by mouth 2 (two) times daily. May repeat 1 hour after bedtime dose if needed for agitation. 08/18/18  Yes [provider]  acetaminophen (TYLENOL) 325 MG tablet Take 650 mg by mouth every 4 (four) hours as needed (for up to 24 hours).    [provider]  alum & mag hydroxide-simeth (MYLANTA) 200-200-20 MG/5ML suspension Take 30 mLs by mouth once as needed for indigestion or heartburn. Take 30 ml by mouth as needed for 1 dose only    [provider]  bismuth subsalicylate (PEPTO BISMOL) 262 MG/15ML suspension Take 15 mLs by mouth every 2 (two) hours as needed (for up to 6 doses in 24 hours).    [provider]  loperamide (IMODIUM) 2 MG capsule Take 2 mg by mouth as needed for diarrhea or loose stools. Take one tablet by mouth after each loose stool for up to 4 doses in 12 hours.    [provider]  magnesium hydroxide (MILK OF MAGNESIA) 400 MG/5ML suspension Take 30 mLs by mouth once as  needed for mild constipation. 6 ounces of Prune juice with Milk of Magnesium    [provider]  sodium phosphate (FLEET) 7-19 GM/118ML ENEM Place 1 enema rectally as needed for severe constipation. Use once for constipation if not relieved by Milk of Mag and Prune juice.    [provider]    Allergies Patient has no known allergies.  Family History  Family history unknown: Yes    Social History Social History   Tobacco Use  . Smoking status: Current Every Day  Smoker  . Smokeless tobacco: Never Used  Substance Use Topics  . Alcohol use: Never    Frequency: Never  . Drug use: Never    Review of Systems Level 5 caveat:  history/ROS limited by chronic dementia ____________________________________________   PHYSICAL EXAM:  VITAL SIGNS: ED Triage Vitals  Enc Vitals Group     BP 10/28/18 0249 127/78     Pulse Rate 10/28/18 0249 78     Resp 10/28/18 0249 (!) 24     Temp 10/28/18 0249 98.3 F (36.8 C)     Temp Source 10/28/18 0249 Oral     SpO2 10/28/18 0249 (!) 89 %     Weight 10/28/18 0256 38.6 kg (85 lb 1.6 oz)     Height 10/28/18 0256 1.626 m (5\' 4" )     Head Circumference --      Peak Flow --      Pain Score 10/28/18 0256 0     Pain Loc --      Pain Edu? --      Excl. in GC? --     Constitutional: Awake and alert, oriented only to self.  No distress on 3 L of oxygen by nasal cannula. Eyes: Conjunctivae are normal.  Head: Atraumatic. Nose: No congestion/rhinnorhea. Mouth/Throat: Mucous membranes are moist. Neck: No stridor.  No meningeal signs.   Cardiovascular: Normal rate, regular rhythm. Good peripheral circulation. Grossly normal heart sounds. Respiratory: Normal respiratory effort.  No retractions. Lungs CTAB. Gastrointestinal: Cachectic.  Soft and nontender. No distention.  Musculoskeletal: No lower extremity tenderness nor edema. No gross deformities of extremities. Neurologic:  Normal speech and language. No gross focal neurologic deficits are appreciated.  Skin:  Skin is warm, dry and intact. No rash noted. Psychiatric: Mood and affect are normal. Speech and behavior are normal.  ____________________________________________   LABS (all labs ordered are listed, but only abnormal results are displayed)  Labs Reviewed  CBC - Abnormal; Notable for the following components:      Result Value   WBC 11.4 (*)    RBC 3.78 (*)    Hemoglobin 11.4 (*)    Platelets 433 (*)    All other components within normal limits    BRAIN NATRIURETIC PEPTIDE - Abnormal; Notable for the following components:   B Natriuretic Peptide 125.0 (*)    All other components within normal limits  COMPREHENSIVE METABOLIC PANEL - Abnormal; Notable for the following components:   Potassium 3.2 (*)    Glucose, Bld 112 (*)    Calcium 8.0 (*)    Total Protein 6.0 (*)    Albumin 2.6 (*)    AST 53 (*)    ALT 50 (*)    Alkaline Phosphatase 192 (*)    All other components within normal limits  CULTURE, BLOOD (ROUTINE X 2)  CULTURE, BLOOD (ROUTINE X 2)  URINE CULTURE  EXPECTORATED SPUTUM ASSESSMENT W REFEX TO RESP CULTURE  GRAM STAIN  TROPONIN I  LIPASE, BLOOD  PROTIME-INR  PROCALCITONIN  URINALYSIS, COMPLETE (UACMP) WITH MICROSCOPIC  HIV ANTIBODY (ROUTINE TESTING W REFLEX)  STREP PNEUMONIAE URINARY ANTIGEN  LEGIONELLA PNEUMOPHILA SEROGP 1 UR AG  INFLUENZA PANEL BY PCR (TYPE A & B)  MAGNESIUM  PHOSPHORUS  CALCIUM, IONIZED  PREALBUMIN  I-STAT CG4 LACTIC ACID, ED  CG4 I-STAT (LACTIC ACID)   ____________________________________________  EKG  ED ECG REPORT I, Loleta Rose, the attending physician, personally viewed and interpreted this ECG.  Date: 10/28/2018 EKG Time: 2:58 AM Rate: 76 Rhythm: Probable normal sinus rhythm but with extensive artifact that in places suggest A. fib. QRS Axis: normal Intervals: normal ST/T Wave abnormalities: Non-specific ST segment / T-wave changes, but no evidence of acute ischemia. Narrative Interpretation: no evidence of acute ischemia.  I checked prior EKGs and the appearance is very similar.    ____________________________________________  RADIOLOGY I, Loleta Rose, personally viewed and evaluated these images (plain radiographs) as part of my medical decision making, as well as reviewing the written report by the radiologist.  ED MD interpretation: Nonspecific findings possibly related to acute infection or chronic lung disease identified on chest x-ray.  Chest CT demonstrates  multifocal pneumonia.  Official radiology report(s): Ct Chest Wo Contrast  Result Date: 10/28/2018 CLINICAL DATA:  Acute onset of difficulty breathing. Decreased O2 saturation. EXAM: CT CHEST WITHOUT CONTRAST TECHNIQUE: Multidetector CT imaging of the chest was performed following the standard protocol without IV contrast. COMPARISON:  Chest radiograph performed earlier today at 3:07 a.m. FINDINGS: Cardiovascular: The heart is normal in size. Diffuse coronary artery calcifications are seen. Mild scattered calcification is noted about the thoracic aorta. The great vessels are grossly unremarkable. Mediastinum/Nodes: Visualized mediastinal nodes remain normal in size. No pericardial effusion is identified. Scattered hypodensities within the thyroid gland measure up to 1.6 cm on the left. No axillary lymphadenopathy is seen. Lungs/Pleura: A small right pleural effusion is noted. Patchy right basilar airspace opacities, and minimal left basilar airspace opacity, concerning for multifocal pneumonia. Underlying diffuse emphysema is noted. No pneumothorax is seen. No dominant mass is identified. Upper Abdomen: The visualized portions of the liver and spleen are unremarkable. The visualized portions of the pancreas are unremarkable. Prominence of the adrenal glands may reflect adrenal hyperplasia. The visualized portions of the kidneys are unremarkable. Musculoskeletal: No acute osseous abnormalities are identified. The visualized musculature is unremarkable in appearance. IMPRESSION: 1. Small right pleural effusion. Patchy right basilar airspace opacities, and minimal left basilar airspace opacity, concerning for multifocal pneumonia. 2. Underlying diffuse emphysema noted. 3. Diffuse coronary artery calcifications seen. 4. Prominence of the adrenal glands may reflect adrenal hyperplasia. 5. Scattered hypodensities within the thyroid gland measure up to 1.6 cm on the left. Consider further evaluation with thyroid  ultrasound. If patient is clinically hyperthyroid, consider nuclear medicine thyroid uptake and scan. Emphysema (ICD10-J43.9). Electronically Signed   By: Roanna Raider M.D.   On: 10/28/2018 05:52   Dg Chest Portable 1 View  Result Date: 10/28/2018 CLINICAL DATA:  Shortness of breath. EXAM: PORTABLE CHEST 1 VIEW COMPARISON:  Frontal and lateral views 09/13/2018. FINDINGS: Chronic hyperinflation and bronchial thickening. Decreased cardiomegaly from prior exam. Improved pleural effusions with probable residual on the right, previously best assessed on the lateral view. Improved patchy opacities in the right midlung zone. No confluent airspace disease. No pneumothorax. Bones are under mineralized. IMPRESSION: 1. Chronic hyperinflation and bronchial thickening, imaging findings consistent with COPD. 2. Improved cardiomegaly and pleural effusions from prior exam. Improved patchy/reticulonodular opacity in the right lung. Electronically Signed  By: Narda RutherfordMelanie  Sanford M.D.   On: 10/28/2018 03:29    ____________________________________________   PROCEDURES  Critical Care performed: Yes, see critical care procedure note(s)   Procedure(s) performed:   .Critical Care Performed by: Loleta RoseForbach, Koni Kannan, MD Authorized by: Loleta RoseForbach, Prabhjot Maddux, MD   Critical care provider statement:    Critical care time (minutes):  30   Critical care time was exclusive of:  Separately billable procedures and treating other patients   Critical care was necessary to treat or prevent imminent or life-threatening deterioration of the following conditions:  Sepsis and respiratory failure   Critical care was time spent personally by me on the following activities:  Development of treatment plan with patient or surrogate, discussions with consultants, evaluation of patient's response to treatment, examination of patient, obtaining history from patient or surrogate, ordering and performing treatments and interventions, ordering and review of  laboratory studies, ordering and review of radiographic studies, pulse oximetry, re-evaluation of patient's condition and review of old charts     ____________________________________________   INITIAL IMPRESSION / ASSESSMENT AND PLAN / ED COURSE  As part of my medical decision making, I reviewed the following data within the electronic MEDICAL RECORD NUMBER Nursing notes reviewed and incorporated, Labs reviewed , EKG interpreted , Old chart reviewed, Radiograph reviewed , Discussed with admitting physician  and Notes from prior ED visits    Differential diagnosis includes, but is not limited to, healthcare associated pneumonia, aspiration pneumonia, viral illness, COPD exacerbation, failure to thrive, other nonspecific metabolic or electrolyte abnormality.  The patient apparently does not have an oxygen requirement at baseline but was hypoxemic prior to arrival as well as having tachypnea.  She has not had tachycardia but does have a mild leukocytosis.  I reviewed her medical record and saw that she had an admission within the last month and a half during which she was quite ill with multifocal pneumonia and sepsis and she does not appear toxic at this time but I am concerned about the possibility of another developing pneumonia, possibly from aspiration.  I will hold off on antibiotics and fluids at the moment because CHF exacerbation is also a possibility but I will evaluate further with a CT scan of the chest without contrast to better characterize the opacities visualized on the x-ray.  In addition to the mild leukocytosis described above, lab work is notable for a normal troponin, normal lactic acid, improved CMP from last time with only mild hypokalemia, LFTs of uncertain significance, very mild elevation of BNP.  Patient stable for further evaluation and work-up.  Clinical Course as of Oct 28 712  Sat Oct 28, 2018  [id number] Patient's work-up is borderline in terms of SIRS criteria but she does have  multifocal pneumonia on her chest CT, tachypnea and hypoxemia as well as a mild leukocytosis.  Given that she was hospitalized for multifocal pneumonia and sepsis within the last couple of months I think it is better to err on the side of caution and treat aggressively.  I have ordered a small fluid bolus of 500 mL normal saline given that she does not meet criteria for severe sepsis nor septic shock.  Given the possibility of aspiration, I am ordering Zosyn 3.375 g IV and vancomycin 1 g IV.  Additional sepsis labs have been ordered such as pro calcitonin, hopefully this will be within normal limits and the patient can be "downgraded" from her sepsis status.  Of note, she has mild elevation of her LFTs but no tenderness  to palpation of her abdomen so we will not further evaluate that at this time.  I discussed the case with the hospitalist who agrees with my plan and will admit.   [CF]    Clinical Course User Index [CF] Loleta Rose, MD    ____________________________________________  FINAL CLINICAL IMPRESSION(S) / ED DIAGNOSES  Final diagnoses:  Sepsis, due to unspecified organism, unspecified whether acute organ dysfunction present (HCC)  HCAP (healthcare-associated pneumonia)  Acute on chronic respiratory failure with hypoxemia (HCC)  Elevated LFTs     MEDICATIONS GIVEN DURING THIS VISIT:  Medications  vancomycin (VANCOCIN) IVPB 1000 mg/200 mL premix (has no administration in time range)  piperacillin-tazobactam (ZOSYN) IVPB 3.375 g (3.375 g Intravenous New Bag/Given 10/28/18 0713)  lactated ringers infusion (has no administration in time range)  potassium chloride (KLOR-CON) packet 40 mEq (has no administration in time range)  piperacillin-tazobactam (ZOSYN) IVPB 3.375 g (has no administration in time range)  vancomycin (VANCOCIN) 500 mg in sodium chloride 0.9 % 100 mL IVPB (has no administration in time range)     ED Discharge Orders    None       Note:  This document was  prepared using Dragon voice recognition software and may include unintentional dictation errors.    Loleta Rose, MD 10/28/18 [phone]

## 2018-10-28 NOTE — H&P (Signed)
[facility] [place] [facility]   PATIENT NAME: Shelly Duran    MR#:  [id number]  DATE OF BIRTH:  Apr 06, 1941  DATE OF ADMISSION:  10/28/2018  PRIMARY CARE PHYSICIAN: Patient, No Pcp Per   REQUESTING/REFERRING PHYSICIAN: Loleta Rose, MD  CHIEF COMPLAINT:   Chief Complaint  Patient presents with  . Respiratory Distress    HISTORY OF PRESENT ILLNESS:  Shelly Duran  is a 78 y.o. female with a known history of dementia p/w SOB, acute hypoxemic respiratory failure. Pt disoriented and unable to provide Hx/ROS. She says she is "not good" but cannot tell me much else. Resides at [facility] assisted [facility]; reported SpO2 88% on room air at facility. Tachypneic, hypoxemic, SIRS (+). CT chest demonstrates, "Small right pleural effusion. Patchy right basilar airspace opacities, and minimal left basilar airspace opacity, concerning for multifocal pneumonia." Recent hospitalization (09/10/2018-09/14/2018, COPD + pneumonia). Now w/ recurrent pneumonia (HCAP), sepsis.  PAST MEDICAL HISTORY:   Past Medical History:  Diagnosis Date  . Alzheimer's dementia (HCC)   . Dementia (HCC)     PAST SURGICAL HISTORY:  History reviewed. No pertinent surgical history.  SOCIAL HISTORY:   Social History   Tobacco Use  . Smoking status: Current Every Day Smoker  . Smokeless tobacco: Never Used  Substance Use Topics  . Alcohol use: Never    Frequency: Never    FAMILY HISTORY:   Family History  Family history unknown: Yes    DRUG ALLERGIES:  No Known Allergies  REVIEW OF SYSTEMS:   Review of Systems  Unable to perform ROS: Dementia   MEDICATIONS AT HOME:   Prior to Admission medications   Medication Sig Start Date End Date Taking? Authorizing Provider  cholecalciferol (VITAMIN D3) 25 MCG (1000 UT) tablet Take 1,000 Units by mouth daily.   Yes [provider]  cyanocobalamin (,VITAMIN B-12,) 1000 MCG/ML injection Inject 1,000 mcg into the muscle every 30  (thirty) days.   Yes [provider]  ipratropium-albuterol (DUONEB) 0.5-2.5 (3) MG/3ML SOLN Take 3 mLs by nebulization every 6 (six) hours as needed. 09/14/18  Yes Enid Baas, MD  methimazole (TAPAZOLE) 5 MG tablet Take 1 tablet (5 mg total) by mouth daily. 09/14/18  Yes Enid Baas, MD  Phenylephrine-DM-GG (ROBITUSSIN TO GO COUGH/COLD CF) 5-10-100 MG/5ML LIQD Take 10 mLs by mouth every 6 (six) hours as needed (for up to 48 hours).   Yes [provider]  risperiDONE (RISPERDAL) 1 MG tablet Take 1 tablet by mouth 2 (two) times daily. May repeat 1 hour after bedtime dose if needed for agitation. 08/18/18  Yes [provider]  acetaminophen (TYLENOL) 325 MG tablet Take 650 mg by mouth every 4 (four) hours as needed (for up to 24 hours).    [provider]  alum & mag hydroxide-simeth (MYLANTA) 200-200-20 MG/5ML suspension Take 30 mLs by mouth once as needed for indigestion or heartburn. Take 30 ml by mouth as needed for 1 dose only    [provider]  bismuth subsalicylate (PEPTO BISMOL) 262 MG/15ML suspension Take 15 mLs by mouth every 2 (two) hours as needed (for up to 6 doses in 24 hours).    [provider]  loperamide (IMODIUM) 2 MG capsule Take 2 mg by mouth as needed for diarrhea or loose stools. Take one tablet by mouth after each loose stool for up to 4 doses in 12 hours.    [provider]  magnesium hydroxide (MILK OF MAGNESIA) 400 MG/5ML suspension Take  30 mLs by mouth once as needed for mild constipation. 6 ounces of Prune juice with Milk of Magnesium    [provider]  sodium phosphate (FLEET) 7-19 GM/118ML ENEM Place 1 enema rectally as needed for severe constipation. Use once for constipation if not relieved by Milk of Mag and Prune juice.    [provider]      VITAL SIGNS:  Blood pressure 120/82, pulse 76, temperature 97.6 F (36.4 C), temperature source Oral, resp. rate 18, height 5\' 4"   (1.626 m), weight 35.9 kg, SpO2 99 %.  PHYSICAL EXAMINATION:  Physical Exam Constitutional:      General: She is awake. She is not in acute distress.    Appearance: She is cachectic. She is ill-appearing. She is not toxic-appearing or diaphoretic.     Interventions: She is not intubated.Nasal cannula in place.  HENT:     Head: Atraumatic.  Eyes:     General: Lids are normal. No scleral icterus.    Extraocular Movements: Extraocular movements intact.     Conjunctiva/sclera: Conjunctivae normal.  Neck:     Musculoskeletal: Neck supple.  Cardiovascular:     Rate and Rhythm: Normal rate and regular rhythm.     Heart sounds: Normal heart sounds, S1 normal and S2 normal. Heart sounds not distant. No murmur. No friction rub. No gallop. No S3 or S4 sounds.   Pulmonary:     Effort: No tachypnea, bradypnea, accessory muscle usage, prolonged expiration, respiratory distress or retractions. She is not intubated.     Breath sounds: Decreased air movement present. No stridor or transmitted upper airway sounds. Examination of the right-upper field reveals decreased breath sounds and rales. Examination of the left-upper field reveals decreased breath sounds and rales. Examination of the right-middle field reveals rales. Examination of the left-middle field reveals decreased breath sounds and rales. Examination of the right-lower field reveals decreased breath sounds and rales. Examination of the left-lower field reveals decreased breath sounds and rales. Decreased breath sounds and rales present. No wheezing or rhonchi.  Abdominal:     General: Abdomen is flat. Bowel sounds are decreased. There is no distension.     Palpations: Abdomen is soft.     Tenderness: There is no abdominal tenderness. There is no guarding or rebound.  Musculoskeletal: Normal range of motion.        General: No swelling or tenderness.     Right lower leg: No edema.     Left lower leg: No edema.  Skin:    General: Skin is warm  and dry.     Findings: No erythema or rash.  Neurological:     Mental Status: She is alert. She is disoriented and confused.  Psychiatric:        Attention and Perception: Attention and perception normal.        Mood and Affect: Mood and affect normal.        Speech: Speech is tangential.        Behavior: Behavior normal. Behavior is cooperative.        Thought Content: Thought content normal.        Cognition and Memory: Memory is impaired. She exhibits impaired recent memory and impaired remote memory.        Judgment: Judgment normal.    LABORATORY PANEL:   CBC Recent Labs  Lab 10/28/18 0307  WBC 11.4*  HGB 11.4*  HCT 37.2  PLT 433*   ------------------------------------------------------------------------------------------------------------------  Chemistries  Recent Labs  Lab 10/28/18 0334  10/28/18 0715  NA 144  --   K 3.2*  --   CL 109  --   CO2 28  --   GLUCOSE 112*  --   BUN 18  --   CREATININE 0.66  --   CALCIUM 8.0*  --   MG  --  1.9  AST 53*  --   ALT 50*  --   ALKPHOS 192*  --   BILITOT 0.7  --    ------------------------------------------------------------------------------------------------------------------  Cardiac Enzymes Recent Labs  Lab 10/28/18 0334  TROPONINI <0.03   ------------------------------------------------------------------------------------------------------------------  RADIOLOGY:  Ct Chest Wo Contrast  Result Date: 10/28/2018 CLINICAL DATA:  Acute onset of difficulty breathing. Decreased O2 saturation. EXAM: CT CHEST WITHOUT CONTRAST TECHNIQUE: Multidetector CT imaging of the chest was performed following the standard protocol without IV contrast. COMPARISON:  Chest radiograph performed earlier today at 3:07 a.m. FINDINGS: Cardiovascular: The heart is normal in size. Diffuse coronary artery calcifications are seen. Mild scattered calcification is noted about the thoracic aorta. The great vessels are grossly unremarkable.  Mediastinum/Nodes: Visualized mediastinal nodes remain normal in size. No pericardial effusion is identified. Scattered hypodensities within the thyroid gland measure up to 1.6 cm on the left. No axillary lymphadenopathy is seen. Lungs/Pleura: A small right pleural effusion is noted. Patchy right basilar airspace opacities, and minimal left basilar airspace opacity, concerning for multifocal pneumonia. Underlying diffuse emphysema is noted. No pneumothorax is seen. No dominant mass is identified. Upper Abdomen: The visualized portions of the liver and spleen are unremarkable. The visualized portions of the pancreas are unremarkable. Prominence of the adrenal glands may reflect adrenal hyperplasia. The visualized portions of the kidneys are unremarkable. Musculoskeletal: No acute osseous abnormalities are identified. The visualized musculature is unremarkable in appearance. IMPRESSION: 1. Small right pleural effusion. Patchy right basilar airspace opacities, and minimal left basilar airspace opacity, concerning for multifocal pneumonia. 2. Underlying diffuse emphysema noted. 3. Diffuse coronary artery calcifications seen. 4. Prominence of the adrenal glands may reflect adrenal hyperplasia. 5. Scattered hypodensities within the thyroid gland measure up to 1.6 cm on the left. Consider further evaluation with thyroid ultrasound. If patient is clinically hyperthyroid, consider nuclear medicine thyroid uptake and scan. Emphysema (ICD10-J43.9). Electronically Signed   By: Roanna RaiderJeffery  Chang M.D.   On: 10/28/2018 05:52   Dg Chest Portable 1 View  Result Date: 10/28/2018 CLINICAL DATA:  Shortness of breath. EXAM: PORTABLE CHEST 1 VIEW COMPARISON:  Frontal and lateral views 09/13/2018. FINDINGS: Chronic hyperinflation and bronchial thickening. Decreased cardiomegaly from prior exam. Improved pleural effusions with probable residual on the right, previously best assessed on the lateral view. Improved patchy opacities in the  right midlung zone. No confluent airspace disease. No pneumothorax. Bones are under mineralized. IMPRESSION: 1. Chronic hyperinflation and bronchial thickening, imaging findings consistent with COPD. 2. Improved cardiomegaly and pleural effusions from prior exam. Improved patchy/reticulonodular opacity in the right lung. Electronically Signed   By: Narda RutherfordMelanie  Sanford M.D.   On: 10/28/2018 03:29   IMPRESSION AND PLAN:   A/P: 62F PMHx dementia p/w SOB, acute hypoxemic respiratory failure, SIRS (+), multifocal pneumonia, HCAP, sepsis. Hypokalemia, hyperglycemia, hypocalcemia, transaminasemia, hypoalbuminemia/hypoproteinemia, leukocytosis, normocytic anemia, thrombocytosis. -SOB, acute hypoxemic respiratory failure, SIRS (+), multifocal pneumonia, HCAP, sepsis: From [facility]. SpO2 88% (RA) at facility. Tachypneic, hypoxemic, SIRS (+). WBC < 12.0. Lungs diminished w/ diffuse fine crackles on exam, (-) rhonchi/wheezing (though pt exhibits poor inspiratory effort). CT chest (+) "Small right pleural effusion. Patchy right basilar airspace opacities, and minimal left basilar airspace opacity,  concerning for multifocal pneumonia." Recent hospitalization (09/10/2018-09/14/2018), recurrent pneumonia (HCAP), sepsis. Presentation also raises some concern for aspiration. NPO, SLP eval. Vancomycin + Zosyn. BCx, sputum gram/Cx, UStrep + ULeg Ag pending. Lactate 1.49, PCT 0.17. Nebs for mobilization of secretions. O2, pulse ox. -Hypokalemia: Replete, monitor. Mag level WNL (1.9). -Hypocalcemia: Ionized calcium. -Hypoalbuminemia, hypoproteinemia: Thin/cachectic appearance. Likely w/ malnourishment/FTT. Prealbumin. -Normocytic anemia: Likely anemia of chronic disease. Stable, no evidence of active/acute blood loss at present time. -c/w home meds/formulary subs. -FEN/GI: NPO. -DVT PPx: Lovenox. -Code status: DNR/DNI. -Disposition: Admission, > 2 midnights.   All the records are reviewed and case discussed with ED  provider. Management plans discussed with the patient, family and they are in agreement.  CODE STATUS: DNR/DNI.  TOTAL TIME TAKING CARE OF THIS PATIENT: 75 minutes.    Barbaraann RondoPrasanna Shrinika Blatz M.D on 10/28/2018 at 8:56 AM  Between 7am to 6pm - Pager - [phone]  After 6pm go to [email or web address] - Social research officer, governmentpassword [email or web address] [email or web address]  [facility]  Office  [phone]  CC: Primary care physician; Patient, No Pcp Per   Note: This dictation was prepared with Dragon dictation along with smaller phrase technology. Any transcriptional errors that result from this process are unintentional.

## 2018-10-28 NOTE — ED Notes (Signed)
Patient has removed her IV and has gotten blood on her sheets and forearm. Patient cleaned up and top sheet changed.

## 2018-10-28 NOTE — ED Triage Notes (Signed)
Patient presents to ED via [facility] from [facility] after complaining of difficulty breathing. Patient is in no acute distress on arrival to ED and is able to answer questions when asked. Staff reported to EMS that patient's level of consciousness was slightly decreased from normal. Patient is 89% on room air and immediately placed on 3 liters [place] with a return to 91%.

## 2018-10-28 NOTE — Progress Notes (Signed)
Patient admitted early this morning.  Seen and examined by me later in the morning.  States she is feeling a little bit better than she did when she first came into the emergency department.  On exam, she has decreased breath sounds throughout all lung fields.  Also some scattered crackles.  No wheezing or rhonchi.  Able to speak in full sentences.  Nasal cannula in place.  -Agree with admission H&P -Continue Vanc and Zosyn for multifocal pneumonia/HCAP -SLP eval pending -Follow-up urinary strep and Legionella antigens -Follow-up blood cultures  CT chest with scattered hypodensities within the thyroid gland-will order thyroid function testing and thyroid ultrasound.  Willadean Carol, MD [facility]

## 2018-10-28 NOTE — ED Notes (Signed)
En route to ED patient received on albuterol treatment. Patient's lungs with crackles bilaterally throughout all fields.

## 2018-10-28 NOTE — Progress Notes (Signed)
CODE SEPSIS - PHARMACY COMMUNICATION  **Broad Spectrum Antibiotics should be administered within 1 hour of Sepsis diagnosis**  Time Code Sepsis Called/Page Received: 06:17  Antibiotics Ordered: Vancomycin and Zosyn  Time of 1st antibiotic administration: Zosyn given at 07:13  Additional action taken by pharmacy: called RN ~07:10 to alert to 1 hour mark approaching  If necessary, Name of Provider/Nurse Contacted:      Foye Deer ,PharmD Clinical Pharmacist  10/28/2018  7:12 AM

## 2018-10-28 NOTE — Progress Notes (Signed)
Anticoagulation monitoring(Lovenox):  78 yo female ordered Lovenox 40 mg Q24h  Filed Weights   10/28/18 0256 10/28/18 0824  Weight: 85 lb 1.6 oz (38.6 kg) 79 lb 3.2 oz (35.9 kg)   Body mass index is 13.59 kg/m.    Lab Results  Component Value Date   CREATININE 0.66 10/28/2018   CREATININE 0.63 09/14/2018   CREATININE 0.65 09/13/2018   Estimated Creatinine Clearance: 34.4 mL/min (by C-G formula based on SCr of 0.66 mg/dL). Hemoglobin & Hematocrit     Component Value Date/Time   HGB 11.4 (L) 10/28/2018 0307   HCT 37.2 10/28/2018 0307     Per Protocol for Patient with estCrcl > 30 ml/min and weight <45 kg, will transition to Lovenox 30 mg Q24h.
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
## 2018-10-29 ENCOUNTER — Inpatient Hospital Stay: Payer: Medicare Other

## 2018-10-29 LAB — CBC
HCT: 35.5 % — ABNORMAL LOW (ref 36.0–46.0)
HEMOGLOBIN: 11.1 g/dL — AB (ref 12.0–15.0)
MCH: 30.2 pg (ref 26.0–34.0)
MCHC: 31.3 g/dL (ref 30.0–36.0)
MCV: 96.7 fL (ref 80.0–100.0)
NRBC: 0 % (ref 0.0–0.2)
Platelets: 493 10*3/uL — ABNORMAL HIGH (ref 150–400)
RBC: 3.67 MIL/uL — ABNORMAL LOW (ref 3.87–5.11)
RDW: 14.9 % (ref 11.5–15.5)
WBC: 17.2 10*3/uL — ABNORMAL HIGH (ref 4.0–10.5)

## 2018-10-29 LAB — PROCALCITONIN: Procalcitonin: <0.1 ng/mL

## 2018-10-29 LAB — BASIC METABOLIC PANEL
Anion gap: 8 (ref 5–15)
BUN: 12 mg/dL (ref 8–23)
CO2: 25 mmol/L (ref 22–32)
Calcium: 8.4 mg/dL — ABNORMAL LOW (ref 8.9–10.3)
Chloride: 107 mmol/L (ref 98–111)
Creatinine, Ser: 0.59 mg/dL (ref 0.44–1.00)
GFR calc non Af Amer: >60 mL/min (ref >60)
Glucose, Bld: 128 mg/dL — ABNORMAL HIGH (ref 70–99)
Potassium: 4 mmol/L (ref 3.5–5.1)
Sodium: 140 mmol/L (ref 135–145)

## 2018-10-29 LAB — TSH: TSH: 0.065 u[IU]/mL — ABNORMAL LOW (ref 0.350–4.500)

## 2018-10-29 LAB — T4, FREE: Free T4: 1.64 ng/dL (ref 0.82–1.77)

## 2018-10-29 LAB — URINE CULTURE: Culture: NO GROWTH

## 2018-10-29 LAB — STREP PNEUMONIAE URINARY ANTIGEN: Strep Pneumo Urinary Antigen: NEGATIVE

## 2018-10-29 MED ORDER — SODIUM CHLORIDE 0.9 % IV SOLN
INTRAVENOUS | Status: DC | PRN
  Administered 2018-10-29: 500 mL via INTRAVENOUS

## 2018-10-29 MED ORDER — IPRATROPIUM-ALBUTEROL 0.5-2.5 (3) MG/3ML IN SOLN: 3.0000 mL | Freq: Four times a day (QID) | RESPIRATORY_TRACT | Status: DC | PRN

## 2018-10-29 MED ORDER — ENSURE ENLIVE PO LIQD
237.0000 mL | Freq: Three times a day (TID) | ORAL | Status: DC
  Administered 2018-10-30 – 2018-11-01 (×5): 237 mL via ORAL

## 2018-10-29 MED ORDER — METHYLPREDNISOLONE SODIUM SUCC 125 MG IJ SOLR
60.0000 mg | Freq: Once | INTRAMUSCULAR | Status: AC
  Administered 2018-10-29: 60 mg via INTRAVENOUS
  Filled 2018-10-29: qty 2

## 2018-10-29 MED ORDER — PREDNISONE 50 MG PO TABS
50.0000 mg | ORAL_TABLET | Freq: Every day | ORAL | Status: AC
  Administered 2018-10-30 – 2018-11-01 (×3): 50 mg via ORAL
  Filled 2018-10-29 (×3): qty 1

## 2018-10-29 MED ORDER — ADULT MULTIVITAMIN W/MINERALS CH
1.0000 | ORAL_TABLET | Freq: Every day | ORAL | Status: DC
  Administered 2018-10-30 – 2018-11-01 (×3): 1 via ORAL
  Filled 2018-10-29 (×3): qty 1

## 2018-10-29 NOTE — Progress Notes (Addendum)
[employer] [facility] - [place] at [facility]   PATIENT NAME: Shelly Duran    MR#:  [id number]  DATE OF BIRTH:  09/03/1941  SUBJECTIVE:   Feeling better this morning.  Endorses some mild shortness of breath.  No cough, fevers, chills.  REVIEW OF SYSTEMS:  Unable to obtain due to dementia DRUG ALLERGIES:  No Known Allergies VITALS:  Blood pressure (!) 149/84, pulse (!) 51, temperature 98.4 F (36.9 C), temperature source Oral, resp. rate 19, height 5\' 4"  (1.626 m), weight 35.9 kg, SpO2 96 %. PHYSICAL EXAMINATION:  Physical Exam  Constitutional:  Chronically ill-appearing, cachectic, laying in bed in no acute distress HEENT: Normocephalic, atraumatic, EOMI, no scleral icterus, moist mucous membranes. Neck: Supple, normal ROM Cardiovascular: RRR, no murmurs, rubs, gallops Respiratory: CTAB, normal work of breathing, [place] in place Gastrointestinal: Soft and nontender. No distention.  Musculoskeletal: No edema, cyanosis, or clubbing Neurologic: CN II through XII grossly intact, + global weakness, sensation intact, no focal deficits Skin:  Skin is warm, dry and intact. No rash noted. Psychiatric: Mood and affect are normal. Speech and behavior are normal. Alert, oriented x 1. LABORATORY PANEL:  Female CBC Recent Labs  Lab 10/29/18 1055  WBC 17.2*  HGB 11.1*  HCT 35.5*  PLT 493*   ------------------------------------------------------------------------------------------------------------------ Chemistries  Recent Labs  Lab 10/28/18 0334 10/28/18 0715 10/29/18 1055  NA 144  --  140  K 3.2*  --  4.0  CL 109  --  107  CO2 28  --  25  GLUCOSE 112*  --  128*  BUN 18  --  12  CREATININE 0.66  --  0.59  CALCIUM 8.0*  --  8.4*  MG  --  1.9  --   AST 53*  --   --   ALT 50*  --   --   ALKPHOS 192*  --   --   BILITOT 0.7  --   --    RADIOLOGY:  [place] Thyroid  Result Date: 10/29/2018 CLINICAL DATA:  Incidental on CT. Thyroid nodule detected by CT of the chest. EXAM:  THYROID ULTRASOUND TECHNIQUE: Ultrasound examination of the thyroid gland and adjacent soft tissues was performed. COMPARISON:  None. FINDINGS: Parenchymal Echotexture: Moderately heterogenous Isthmus: 0.3 cm Right lobe: 4.2 x 2.9 x 2.0 cm Left lobe: 4.1 x 3.1 x 1.8 cm _________________________________________________________ Estimated total number of nodules >/= 1 cm: 2 Number of spongiform nodules >/=  2 cm not described below (TR1): 0 Number of mixed cystic and solid nodules >/= 1.5 cm not described below (TR2): 0 _________________________________________________________ Nodule # 1: Location: Right; Inferior Maximum size: 2.0 cm; Other 2 dimensions: 1.6 x 2.0 cm Composition: solid/almost completely solid (2) Echogenicity: hypoechoic (2) Shape: taller-than-wide (3) Margins: ill-defined (0) Echogenic foci: macrocalcifications (1) ACR TI-RADS total points: 8. ACR TI-RADS risk category: TR5 (>/= 7 points). ACR TI-RADS recommendations: **Given size (>/= 1.0 cm) and appearance, fine needle aspiration of this highly suspicious nodule should be considered based on TI-RADS criteria. _________________________________________________________ Nodule # 2: Location: Left; Superior Maximum size: 2.0 cm; Other 2 dimensions: 1.0 x 1.6 cm Composition: solid/almost completely solid (2) Echogenicity: isoechoic (1) Shape: not taller-than-wide (0) Margins: ill-defined (0) Echogenic foci: none (0) ACR TI-RADS total points: 3. ACR TI-RADS risk category: TR3 (3 points). ACR TI-RADS recommendations: *Given size (>/= 1.5 - 2.4 cm) and appearance, a follow-up ultrasound in 1 year should be considered based on TI-RADS criteria. _________________________________________________________ No abnormal lymph nodes identified. IMPRESSION: 1. 2 cm vaguely  marginated right inferior thyroid nodule meets criteria for elective ultrasound-guided fine-needle aspiration. This can be performed on an outpatient basis when the patient has recovered from acute  illness. 2. 2 cm left superior thyroid nodule meets criteria for 1 year follow-up ultrasound. The above is in keeping with the ACR TI-RADS recommendations - J Am Coll Radiol 2017;14:587-595. Electronically Signed   By: Irish LackGlenn  Yamagata M.D.   On: 10/29/2018 09:38   ASSESSMENT AND PLAN:   Sepsis secondary to multifocal pneumonia- sepsis resolved, leukocytosis improving.  Procalcitonin improving. -Continue IV vancomycin and zosyn, plan to change to po tomorrow -Blood and urine cultures with no growth to date  Acute hypoxic respiratory failure- secondary to multifocal pneumonia in the setting of COPD.  Improving.  Remains on 3 L nasal cannula. -Will add IV Solu-Medrol x1 today, then start prednisone tomorrow morning -Wean oxygen as able -Duonebs as needed -Seen by SLP due to concern for aspiration- placed on dysphagia I diet  Hyperthyroidism- CT chest with scattered hypodensities within the thyroid gland.  Thyroid ultrasound performed yesterday with 2 distinct thyroid nodules, 1 of which is located in the right inferior thyroid and meets criteria for ultrasound-guided FNA -Continue home methimazole -Needs fine-needle aspiration of thyroid nodule as an outpatient -TSH, T3, T4 pending  Anemia of chronic disease- stable.  Hemoglobin at baseline -Check anemia panel with morning labs -Continue to monitor  Advanced dementia-stable, at mental status baseline -Supportive care -Fall precautions  Severe protein calorie malnutrition -Dietitian consult  All the records are reviewed and case discussed with Care Management/Social Worker. Management plans discussed with the patient, family and they are in agreement.  CODE STATUS: DNR  TOTAL TIME TAKING CARE OF THIS PATIENT: 35 minutes.   More than 50% of the time was spent in counseling/coordination of care: YES  POSSIBLE D/C IN 1-2 DAYS, DEPENDING ON CLINICAL CONDITION.   Jinny BlossomKaty D Rook Maue M.D on 10/29/2018 at 2:37 PM  Between 7am to 6pm - Pager -  [phone]  After 6pm go to [email or web address] - Social research officer, governmentpassword EPAS [facility]  [facility] [place] [facility]  Office  [phone]  CC: Primary care physician; Patient, No Pcp Per  Note: This dictation was prepared with Dragon dictation along with smaller phrase technology. Any transcriptional errors that result from this process are unintentional.

## 2018-10-29 NOTE — Progress Notes (Signed)
Initial Nutrition Assessment  DOCUMENTATION CODES:   Severe malnutrition in context of chronic illness, Underweight  INTERVENTION:  Provide Ensure Enlive po TID, each supplement provides 350 kcal and 20 grams of protein.  Provide Magic cup TID with meals, each supplement provides 290 kcal and 9 grams of protein.  Provide daily MVI.  NUTRITION DIAGNOSIS:   Severe Malnutrition related to chronic illness(COPD, advanced dementia) as evidenced by severe fat depletion, severe muscle depletion.  GOAL:   Patient will meet greater than or equal to 90% of their needs  MONITOR:   PO intake, Supplement acceptance, Labs, Weight trends, I & O's  REASON FOR ASSESSMENT:   Malnutrition Screening Tool, Consult Assessment of nutrition requirement/status  ASSESSMENT:   78 year old female with advanced dementia, COPD, hyperthyroidism, anemia of chronic disease, admitted with sepsis secondary to multifocal PNA.   -Following SLP evaluation on 1/4 diet was advanced to dysphagia 1 with thin liquids.  Met with patient at bedside. She was sleeping soundly and would not wake up for assessment. She is known to this RD from an admission a few months ago. She is from [facility]. Discussed with SLP. Patient is still able to feed herself at baseline. She does better with liquids/oral nutrition supplements than food. Per chart patient ate 45% of her dinner last night but 0% of her breakfast this morning.  Limited weight history in chart. Patient was 40.7 kg on 09/10/2018. She is currently 35.9 kg (79.2 lbs). She has lost 4.8 kg (11.8% body weight) over the past 2 months, which is significant for time frame. Patient already met criteria for severe chronic malnutrition in November.  Medications reviewed and include: Solu-Medrol 60 mg IV once today, prednisone 50 mg daily starting tomorrow, Zosyn, vancomycin.  Labs reviewed.  Discussed with RN.  NUTRITION - FOCUSED PHYSICAL EXAM:    Most Recent Value   Orbital Region  Severe depletion  Upper Arm Region  Severe depletion  Thoracic and Lumbar Region  Severe depletion  Buccal Region  Severe depletion  Temple Region  Severe depletion  Clavicle Bone Region  Severe depletion  Clavicle and Acromion Bone Region  Severe depletion  Scapular Bone Region  Severe depletion  Dorsal Hand  Severe depletion  Patellar Region  Severe depletion  Anterior Thigh Region  Severe depletion  Posterior Calf Region  Severe depletion  Edema (RD Assessment)  Mild [to right hand only]  Hair  Reviewed  Eyes  Reviewed  Mouth  Reviewed [edentulous]  Skin  Reviewed  Nails  Reviewed     Diet Order:   Diet Order            DIET - DYS 1 Room service appropriate? Yes with Assist; Fluid consistency: Thin  Diet effective now             EDUCATION NEEDS:   Not appropriate for education at this time  Skin:  Skin Assessment: Reviewed RN Assessment(ecchymosis to bilateral hands; sacrum red/blanchable)  Last BM:  10/29/2017 - small type 5  Height:   Ht Readings from Last 1 Encounters:  10/28/18 _0  (1.626 m)   Weight:   Wt Readings from Last 1 Encounters:  10/28/18 35.9 kg   Ideal Body Weight:  54.5 kg  BMI:  Body mass index is 13.59 kg/m.  Estimated Nutritional Needs:   Kcal:  1200-1400  Protein:  60-70 grams  Fluid:  1.2-1.4 L/day  Willey Blade, MS, RD, LDN Office: [phone] Pager: [phone] After Hours/Weekend Pager: [phone]

## 2018-10-30 LAB — BASIC METABOLIC PANEL
Anion gap: 7 (ref 5–15)
BUN: 16 mg/dL (ref 8–23)
CO2: 28 mmol/L (ref 22–32)
Calcium: 8.2 mg/dL — ABNORMAL LOW (ref 8.9–10.3)
Chloride: 106 mmol/L (ref 98–111)
Creatinine, Ser: 0.75 mg/dL (ref 0.44–1.00)
GFR calc Af Amer: >60 mL/min (ref >60)
GFR calc non Af Amer: >60 mL/min (ref >60)
Glucose, Bld: 129 mg/dL — ABNORMAL HIGH (ref 70–99)
POTASSIUM: 4 mmol/L (ref 3.5–5.1)
SODIUM: 141 mmol/L (ref 135–145)

## 2018-10-30 LAB — CBC
HCT: 35.9 % — ABNORMAL LOW (ref 36.0–46.0)
Hemoglobin: 11.2 g/dL — ABNORMAL LOW (ref 12.0–15.0)
MCH: 30.4 pg (ref 26.0–34.0)
MCHC: 31.2 g/dL (ref 30.0–36.0)
MCV: 97.3 fL (ref 80.0–100.0)
Platelets: 517 10*3/uL — ABNORMAL HIGH (ref 150–400)
RBC: 3.69 MIL/uL — AB (ref 3.87–5.11)
RDW: 14.5 % (ref 11.5–15.5)
WBC: 12.3 10*3/uL — ABNORMAL HIGH (ref 4.0–10.5)
nRBC: 0 % (ref 0.0–0.2)

## 2018-10-30 LAB — LEGIONELLA PNEUMOPHILA SEROGP 1 UR AG: L. PNEUMOPHILA SEROGP 1 UR AG: NEGATIVE

## 2018-10-30 LAB — IRON AND TIBC
IRON: 20 ug/dL — AB (ref 28–170)
Saturation Ratios: 14 % (ref 10.4–31.8)
TIBC: 139 ug/dL — ABNORMAL LOW (ref 250–450)
UIBC: 119 ug/dL

## 2018-10-30 LAB — CALCIUM, IONIZED: Calcium, Ionized, Serum: 5.1 mg/dL (ref 4.5–5.6)

## 2018-10-30 LAB — FOLATE: Folate: 11.4 ng/mL (ref >5.9)

## 2018-10-30 LAB — VITAMIN B12: Vitamin B-12: 1498 pg/mL — ABNORMAL HIGH (ref 180–914)

## 2018-10-30 LAB — PROCALCITONIN: Procalcitonin: <0.1 ng/mL

## 2018-10-30 LAB — FERRITIN: FERRITIN: 215 ng/mL (ref 11–307)

## 2018-10-30 MED ORDER — METOPROLOL TARTRATE 5 MG/5ML IV SOLN
5.0000 mg | Freq: Four times a day (QID) | INTRAVENOUS | Status: DC | PRN
  Administered 2018-10-30: 5 mg via INTRAVENOUS
  Filled 2018-10-30: qty 5

## 2018-10-30 MED ORDER — AMOXICILLIN-POT CLAVULANATE 400-57 MG/5ML PO SUSR
875.0000 mg | Freq: Two times a day (BID) | ORAL | Status: DC
  Administered 2018-10-30 – 2018-11-01 (×4): 875 mg via ORAL
  Filled 2018-10-30 (×8): qty 10.9

## 2018-10-30 NOTE — Progress Notes (Signed)
PT Cancellation Note  Patient Details Name: Shelly Duran MRN: [id number] DOB: 07-25-1941   Cancelled Treatment:    Reason Eval/Treat Not Completed: Medical issues which prohibited therapy.  HR monitor reading Non-sustained VT and HR as high as 121 at rest.  Will hold PT until pt more medically appropriate for exertional activity.    Encarnacion Chu PT, DPT 10/30/2018, 1:14 PM
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
## 2018-10-30 NOTE — Progress Notes (Signed)
OT Cancellation Note  Patient Details Name: Shelly Duran MRN: [id number] DOB: May 05, 1941   Cancelled Treatment:    Reason Eval/Treat Not Completed: Medical issues which prohibited therapy. Consult received, chart reviewed. HR monitor reading Non-sustained VT and HR as high as 121 at rest.  Will hold OT evaluation until pt more medically appropriate for exertional activity.   Richrd Prime, MPH, MS, OTR/L ascom [phone] 10/30/18, 1:27 PM

## 2018-10-30 NOTE — Progress Notes (Signed)
Messaged MD regarding pt's increasing PACs and PVCs today. As well as with the tach event- HR of 180s per central tele. Refer to orders.

## 2018-10-30 NOTE — Progress Notes (Addendum)
[employer] [facility] - [place] at [facility]   PATIENT NAME: Shelly Duran    MR#:  [id number]  DATE OF BIRTH:  Jul 13, 1941  SUBJECTIVE:   States she is feeling fine this morning.  No concerns.  She denies any shortness of breath or chest pain.  REVIEW OF SYSTEMS:  Unable to obtain due to dementia DRUG ALLERGIES:  No Known Allergies VITALS:  Blood pressure 112/74, pulse 66, temperature 98 F (36.7 C), resp. rate 18, height 5\' 4"  (1.626 m), weight 35.9 kg, SpO2 99 %. PHYSICAL EXAMINATION:  Physical Exam HENT:     Head: Atraumatic.     Constitutional:  Chronically ill-appearing, cachectic, laying in bed in no acute distress HEENT: Normocephalic, atraumatic, EOMI, no scleral icterus, moist mucous membranes. Neck: Supple, normal ROM Cardiovascular: RRR, no murmurs, rubs, gallops Respiratory: + Diminished breath sounds throughout all lung fields, no crackles or wheezing, normal work of breathing, [place] in place Gastrointestinal: Soft and nontender. No distention.  Musculoskeletal: No edema, cyanosis, or clubbing Neurologic: CN II through XII grossly intact, + global weakness, sensation intact, no focal deficits Skin:  Skin is warm, dry and intact. No rash noted. Psychiatric: Mood and affect are normal. Speech and behavior are normal. Alert, oriented x 1. LABORATORY PANEL:  Female CBC Recent Labs  Lab 10/30/18 0634  WBC 12.3*  HGB 11.2*  HCT 35.9*  PLT 517*   ------------------------------------------------------------------------------------------------------------------ Chemistries  Recent Labs  Lab 10/28/18 0334 10/28/18 0715  10/30/18 0634  NA 144  --    < > 141  K 3.2*  --    < > 4.0  CL 109  --    < > 106  CO2 28  --    < > 28  GLUCOSE 112*  --    < > 129*  BUN 18  --    < > 16  CREATININE 0.66  --    < > 0.75  CALCIUM 8.0*  --    < > 8.2*  MG  --  1.9  --   --   AST 53*  --   --   --   ALT 50*  --   --   --   ALKPHOS 192*  --   --   --   BILITOT 0.7   --   --   --    < > = values in this interval not displayed.   RADIOLOGY:  No results found. ASSESSMENT AND PLAN:   Sepsis secondary to multifocal pneumonia- sepsis resolved, leukocytosis improving.  Procalcitonin has been negative for the last 2 days. -Change IV vancomycin and zosyn to Augmentin today -Blood and urine cultures with no growth to date -PT/OT consult  Acute hypoxic respiratory failure- secondary to multifocal pneumonia in the setting of COPD.  Improving.  Remains on 2 L nasal cannula. -Continue prednisone for a total 5-day course -Wean oxygen as able -Duonebs as needed -Seen by SLP due to concern for aspiration- placed on dysphagia I diet  Hyperthyroidism- CT chest with scattered hypodensities within the thyroid gland.  Thyroid ultrasound performed yesterday with 2 distinct thyroid nodules, 1 of which is located in the right inferior thyroid and meets criteria for ultrasound-guided FNA -TSH low, but T4 is normal -Continue home methimazole -Needs fine-needle aspiration of thyroid nodule as an outpatient  Episode of sinus tachycardia/PACs/PVCs- occurred today while patient was sleeping.  Heart rates have otherwise been very well controlled.  May be related to above. -Cardiac monitoring -Will add  metoprolol IV as needed heart rate > 120  Anemia of chronic disease- stable.  Hemoglobin at baseline -Anemia panel unremarkable -Continue to monitor  Advanced dementia-stable, at mental status baseline -Supportive care -Fall precautions  Severe protein calorie malnutrition -Dietitian consult  May be able to be discharged tomorrow, pending PT/OT evals.  All the records are reviewed and case discussed with Care Management/Social Worker. Management plans discussed with the patient, family and they are in agreement.  CODE STATUS: DNR  TOTAL TIME TAKING CARE OF THIS PATIENT: 35 minutes.   More than 50% of the time was spent in counseling/coordination of care:  YES  POSSIBLE D/C tomorrow, DEPENDING ON CLINICAL CONDITION.   Shelly Duran M.D on 10/30/2018 at 4:40 PM  Between 7am to 6pm - Pager - [phone]  After 6pm go to [email or web address] - Social research officer, government  [facility] [place] [facility]  Office  [phone]  CC: Primary care physician; Patient, No Pcp Per  Note: This dictation was prepared with Dragon dictation along with smaller phrase technology. Any transcriptional errors that result from this process are unintentional.

## 2018-10-30 NOTE — Clinical Social Work Note (Signed)
CSW received consult that patient is from [facility].  CSW attempted to call ALF manager, and CSW left a message awaiting for call back.  CSW to facilitate discharge planning.  Ervin Knack. Avalon Coppinger, MSW, Theresia Majors [phone]  10/30/2018 5:11 PM

## 2018-10-31 LAB — CREATININE, SERUM
Creatinine, Ser: 0.69 mg/dL (ref 0.44–1.00)
GFR calc Af Amer: >60 mL/min (ref >60)
GFR calc non Af Amer: >60 mL/min (ref >60)

## 2018-10-31 LAB — T3: T3, Total: 75 ng/dL (ref 71–180)

## 2018-10-31 LAB — PROCALCITONIN

## 2018-10-31 NOTE — Progress Notes (Signed)
OT Cancellation Note  Patient Details Name: Shelly Duran MRN: [id number] DOB: 1941/03/07   Cancelled Treatment:    Reason Eval/Treat Not Completed: Medical issues which prohibited therapy. Spoke with PT who just worked with pt. Per PT, telemetry attempting to establish better heart monitoring. Per chart, EKG now ordered 2/2 "skipped beats." Will continue to hold OT evaluation, will continue to follow acutely and re-attempt OT evaluation as medically appropriate.   Richrd Prime, MPH, MS, OTR/L ascom [phone] 10/31/18, 9:34 AM

## 2018-10-31 NOTE — Care Management Important Message (Signed)
Copy of signed Medicare IM left with patient in room. 

## 2018-10-31 NOTE — Evaluation (Signed)
Physical Therapy Evaluation Patient Details Name: Shelly NorrieDiane Pirie MRN: [id number] DOB: 08/11/1941 Today's Date: 10/31/2018   History of Present Illness  Pt is a 78 y/o F who presented with SOB, acute hypoxemic respiratory failure.  Pt with sepsis secondary to pneumonia.  CT showed R pleural effusion.  Pt found to have 2 thyroid nodules. Pt has had episodes of PACs and PVCs as well as sinus tachycardia during hospital stay and is on cardiac monitoring.  Pt's PMH includes dementia.      Clinical Impression  Pt admitted with above diagnosis. Pt currently with functional limitations due to the deficits listed below (see PT Problem List). Ms. Nunzio CoryLyons was unable to provide PLOF or home layout information. HR monitor reading 37-41 bpm one moment and then another reading 67-70 and readings "irregular HR" (during light exercise).  When RN notified of this the RN reports that central tele is likely switching views for a good reading.  Deferred OOB mobility given pt's irregular heart rate and rhythm. Pt asymptomatic but is a poor historian.  Pt will benefit from skilled PT to increase their independence and safety with mobility to allow discharge to the venue listed below.      Follow Up Recommendations SNF    Equipment Recommendations  Other (comment)(TBD at next venue of care)    Recommendations for Other Services       Precautions / Restrictions Precautions Precautions: Fall;Other (comment) Precaution Comments: monitor HR Restrictions Weight Bearing Restrictions: No      Mobility  Bed Mobility Overal bed mobility: Needs Assistance Bed Mobility: Rolling Rolling: Mod assist         General bed mobility comments: Assist to roll to allow RN to replace electrodes for heart monitor  Transfers                 General transfer comment: Did not attempt due to HR monitor reading (see general comments below)  Ambulation/Gait                Stairs            Wheelchair  Mobility    Modified Rankin (Stroke Patients Only)       Balance                                             Pertinent Vitals/Pain Pain Assessment: Faces Pain Score: 0-No pain Faces Pain Scale: No hurt Pain Location: pt denies pain, no sign of pain Pain Intervention(s): Monitored during session    Home Living Family/patient expects to be discharged to:: Assisted living                 Additional Comments: Per chart review, pt from ALF    Prior Function           Comments: No family or caregiver in room to provide information, pt is a poor historian.      Hand Dominance        Extremity/Trunk Assessment   Upper Extremity Assessment Upper Extremity Assessment: Generalized weakness    Lower Extremity Assessment Lower Extremity Assessment: Generalized weakness    Cervical / Trunk Assessment Cervical / Trunk Assessment: Kyphotic  Communication   Communication: No difficulties  Cognition Arousal/Alertness: Awake/alert Behavior During Therapy: WFL for tasks assessed/performed Overall Cognitive Status: History of cognitive impairments - at baseline  General Comments: Pt oriented to self only.       General Comments General comments (skin integrity, edema, etc.): HR monitor reading 37-41 bpm one moment and then another reading 67-70 and readings "irregular HR" (during light exercise).  When RN notified of this the RN reports that central tele is likely switching views for a good reading.  Deferred OOB mobility given pt's irregular heart rate and rhythm. Pt asymptomatic but is a poor historian.    Exercises General Exercises - Upper Extremity Shoulder Flexion: AROM;Both;10 reps;Supine General Exercises - Lower Extremity Ankle Circles/Pumps: AROM;Both;10 reps;Supine Straight Leg Raises: Both;10 reps;Supine   Assessment/Plan    PT Assessment Patient needs continued PT services  PT Problem  List Decreased strength;Decreased activity tolerance;Decreased balance;Decreased mobility;Decreased cognition;Decreased knowledge of use of DME;Decreased safety awareness;Cardiopulmonary status limiting activity       PT Treatment Interventions DME instruction;Gait training;Functional mobility training;Therapeutic activities;Therapeutic exercise;Balance training;Neuromuscular re-education;Cognitive remediation;Patient/family education;Wheelchair mobility training    PT Goals (Current goals can be found in the Care Plan section)  Acute Rehab PT Goals Patient Stated Goal: pt unable to state PT Goal Formulation: Patient unable to participate in goal setting Time For Goal Achievement: 11/14/18 Potential to Achieve Goals: Fair    Frequency Min 2X/week   Barriers to discharge Other (comment) Unsure of amount of assist avaialble at ALF    Co-evaluation               AM-PAC PT "6 Clicks" Mobility  Outcome Measure Help needed turning from your back to your side while in a flat bed without using bedrails?: A Lot Help needed moving from lying on your back to sitting on the side of a flat bed without using bedrails?: A Lot Help needed moving to and from a bed to a chair (including a wheelchair)?: Total Help needed standing up from a chair using your arms (e.g., wheelchair or bedside chair)?: Total Help needed to walk in hospital room?: Total Help needed climbing 3-5 steps with a railing? : Total 6 Click Score: 8    End of Session   Activity Tolerance: Treatment limited secondary to medical complications (Comment)(irregular heart monitor reading) Patient left: in bed;with call bell/phone within reach;with bed alarm set Nurse Communication: Mobility status;Other (comment)(RN aware of HR monitor readings) PT Visit Diagnosis: Muscle weakness (generalized) (M62.81);Other abnormalities of gait and mobility (R26.89);Unsteadiness on feet (R26.81)    Time: 9528-4132 PT Time Calculation (min)  (ACUTE ONLY): 20 min   Charges:   PT Evaluation $PT Eval Moderate Complexity: 1 Mod PT Treatments $Therapeutic Exercise: 8-22 mins        Encarnacion Chu PT, DPT 10/31/2018, 10:26 AM

## 2018-10-31 NOTE — Clinical Social Work Note (Signed)
Clinical Social Work Assessment  Patient Details  Name: Shelly Duran MRN: [id number] Date of Birth: [birth year]  Date of referral:  10/31/18               Reason for consult:  Facility Placement                Permission sought to share information with:  Facility Medical sales representative, Family Supports Permission granted to share information::  Yes, Verbal Permission Granted  Name::     Sosha, Emsley   [phone] or Loyola Mast  [phone] or Wilburn Cornelia Relative   [phone]   Agency::  ALF and SNF admissions  Relationship::     Contact Information:     Housing/Transportation Living arrangements for the past 2 months:  Assisted Living Facility([facility] Source of Information:  Facility Patient Interpreter Needed:  None Criminal Activity/Legal Involvement Pertinent to Current Situation/Hospitalization:  No - Comment as needed Significant Relationships:  Adult Children, Community Support Lives with:  Facility Resident Do you feel safe going back to the place where you live?  Yes Need for family participation in patient care:  Yes (Comment)  Care giving concerns:  Plan is for patient to return back to ALF [facility].   Social Worker assessment / plan:  Patient is a 78 year old female who is alert and oriented x1.  Patient has dementia, assessment completed by speaking with Janey Greaser of ALF.  Patient has been living at ALF for several years and she was receiving home health PT, OT, ST, and nursing.  CSW spoke to manager of ALF, and she does not think patient would do well at SNF based on her dementia.  [name] reported that patient only has 11 other residents in her facility, and she has more staffing who are able to meet the needs of the patient.  Manager reports that they assist with many of patient's ADLs.  [name] stated that patient is chronic on oxygen, and they check her stats regularly.  [name] feels that she can accept patient back to  ALF with home health services.  CSW explained role of CSW and process for facilitating discharge planning.  [name] did not have any other questions, and requested CSW send her FL2 and discharge summary once patient is ready for discharge.   Employment status:  Retired Health and safety inspector:  Medicare PT Recommendations:  Skilled Nursing Facility Information / Referral to community resources:  Skilled Nursing Facility  Patient/Family's Response to care:  Patient caregiver is in agreement to having patient return with home health.  Patient/Family's Understanding of and Emotional Response to Diagnosis, Current Treatment, and Prognosis:  [name] is hopeful that patient will be able to return tomorrow.  Emotional Assessment Appearance:  Appears stated age Attitude/Demeanor/Rapport:    Affect (typically observed):  Quiet, Stable, Appropriate Orientation:  Oriented to Self Alcohol / Substance use:  Not Applicable Psych involvement (Current and /or in the community):  No (Comment)  Discharge Needs  Concerns to be addressed:  Care Coordination, Cognitive Concerns Readmission within the last 30 days:  No Current discharge risk:  Cognitively Impaired Barriers to Discharge:  Continued Medical Work up   [place] Constable 10/31/2018, 6:49 PM

## 2018-10-31 NOTE — Plan of Care (Signed)
  Problem: Safety: Goal: Ability to remain free from injury will improve Outcome: Progressing   Problem: Fluid Volume: Goal: Hemodynamic stability will improve Outcome: Progressing   Problem: Clinical Measurements: Goal: Signs and symptoms of infection will decrease Outcome: Progressing   

## 2018-10-31 NOTE — Clinical Social Work Note (Addendum)
CSW spoke with [name] the Production designer, theatre/television/film of the memory care ALF that she resides in.  [place] reported that patient was open to [facility] with PT, OT, ST, and nursing.  She would like home health to resume, CSW updated case Production designer, theatre/television/film and physician. [name] also reports that patient has chronic oxygen, and she does not always keep it on, but they check her stats regularly.  [name] reports that patient is in the memory care and they assist her with most of her ADLs.  She can accept patient back tomorrow if she is medically ready for discharge and orders have been received.  Ervin Knack. Alvena Kiernan, MSW, Theresia Majors [phone]  10/31/2018 2:01 PM

## 2018-10-31 NOTE — NC FL2 (Addendum)
[place] MEDICAID FL2 LEVEL OF CARE SCREENING TOOL     IDENTIFICATION  Patient Name: Shelly Duran Birthdate: 11-17-1940 Sex: female Admission Date (Current Location): 10/28/2018  [place] and [place] Number:  [address] [id number] Q Facility and Address:  [facility], [address] [place], [place] [address]      Provider Number: [id number]  Attending Physician Name and Address:  Katha Hamming, MD  Relative Name and Phone Number:  Thuy, Atilano   [phone] or Loyola Mast  [phone] or Wilburn Cornelia Relative   [phone]     Current Level of Care: Hospital Recommended Level of Care: ALF [facility] Prior Approval Number:    Date Approved/Denied:   PASRR Number:   Discharge Plan: ALF [facility]    Current Diagnoses: Patient Active Problem List   Diagnosis Date Noted  . Sepsis due to pneumonia (HCC) 10/28/2018  . Protein-calorie malnutrition, severe 09/12/2018  . COPD exacerbation (HCC)   . Acute respiratory failure (HCC)   . Multifocal pneumonia   . Palliative care encounter   . Sepsis (HCC) 09/10/2018  . 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 ELEVATED BP READING WITHOUT DX HYPERTENSION 03/10/2010    Orientation RESPIRATION BLADDER Height & Weight     Self  O2(1L) Incontinent Weight: 79 lb 3.2 oz (35.9 kg) Height:  5\' 4"  (162.6 cm)  BEHAVIORAL SYMPTOMS/MOOD NEUROLOGICAL BOWEL NUTRITION STATUS      Incontinent (Mechanical Soft Dysphagia 1)  AMBULATORY STATUS COMMUNICATION OF NEEDS Skin   Limited Assist Verbally Normal                       Personal Care Assistance Level of Assistance  Feeding, Dressing, Bathing Bathing Assistance: Limited assistance Feeding assistance: Limited assistance Dressing Assistance: Limited assistance     Functional Limitations Info  Sight, Hearing, Speech Sight Info: Adequate Hearing Info: Adequate Speech Info: Adequate    SPECIAL CARE FACTORS FREQUENCY   PT (By licensed PT), OT (By licensed OT)  ST (By Licensed ST)     PT Frequency: home health minimum 2x a week OT Frequency: home health minimum 2x a week ST Frequency:  Home health minimum 2x a week            Contractures Contractures Info: Not present    Additional Factors Info  Code Status, Allergies, Psychotropic Code Status Info: DNR Allergies Info: NKA Psychotropic Info: risperiDONE (RISPERDAL) tablet 1 mg          Current Medications (10/31/2018):  This is the current hospital active medication list Current Facility-Administered Medications  Medication Dose Route Frequency Provider Last Rate Last Dose  . 0.9 %  sodium chloride infusion   Intravenous PRN Campbell Stall, MD 10 mL/hr at 10/29/18 1357 500 mL at 10/29/18 1357  . acetaminophen (TYLENOL) tablet 650 mg  650 mg Oral Q6H PRN Barbaraann Rondo, MD       Or  . acetaminophen (TYLENOL) suppository 650 mg  650 mg Rectal Q6H PRN Barbaraann Rondo, MD      . amoxicillin-clavulanate (AUGMENTIN) 400-57 MG/5ML suspension 875 mg  875 mg Oral Q12H Mayo, Allyn Kenner, MD   875 mg at 10/31/18 0825  . bisacodyl (DULCOLAX) EC tablet 5 mg  5 mg Oral Daily PRN Barbaraann Rondo, MD      . enoxaparin (LOVENOX) injection 30 mg  30 mg Subcutaneous Q24H Barbaraann Rondo, MD   30 mg at 10/30/18 1653  . feeding supplement (ENSURE ENLIVE) (ENSURE ENLIVE) liquid 237 mL  237 mL Oral TID BM Mayo, Allyn Kenner, MD   237 mL at 10/31/18 0825  . ipratropium-albuterol (DUONEB) 0.5-2.5 (3) MG/3ML nebulizer solution 3 mL  3 mL Nebulization Q6H PRN Mayo, Allyn Kenner, MD      . MEDLINE mouth rinse  15 mL Mouth Rinse BID Mayo, Allyn Kenner, MD   15 mL at 10/31/18 0825  . methimazole (TAPAZOLE) tablet 5 mg  5 mg Oral Daily Barbaraann Rondo, MD   5 mg at 10/31/18 0824  . metoprolol tartrate (LOPRESSOR) injection 5 mg  5 mg Intravenous Q6H PRN Mayo, Allyn Kenner, MD   5 mg at 10/30/18 1411  . multivitamin with minerals tablet 1 tablet  1 tablet Oral Daily  Mayo, Allyn Kenner, MD   1 tablet at 10/31/18 [phone]  . ondansetron (ZOFRAN) tablet 4 mg  4 mg Oral Q6H PRN Barbaraann Rondo, MD       Or  . ondansetron (ZOFRAN) injection 4 mg  4 mg Intravenous Q6H PRN Barbaraann Rondo, MD      . predniSONE (DELTASONE) tablet 50 mg  50 mg Oral Q breakfast Mayo, Allyn Kenner, MD   50 mg at 10/31/18 0824  . risperiDONE (RISPERDAL) tablet 1 mg  1 mg Oral BID Barbaraann Rondo, MD   1 mg at 10/31/18 0824  . senna-docusate (Senokot-S) tablet 1 tablet  1 tablet Oral QHS PRN Barbaraann Rondo, MD         Discharge Medications: TAKE these medications   acetaminophen 325 MG tablet Commonly known as:  TYLENOL Take 650 mg by mouth every 4 (four) hours as needed (for up to 24 hours).   amoxicillin-clavulanate 400-57 MG/5ML suspension Commonly known as:  AUGMENTIN Take 10.9 mLs (875 mg total) by mouth every 12 (twelve) hours.   bismuth subsalicylate 262 MG/15ML suspension Commonly known as:  PEPTO BISMOL Take 15 mLs by mouth every 2 (two) hours as needed (for up to 6 doses in 24 hours).   cholecalciferol 25 MCG (1000 UT) tablet Commonly known as:  VITAMIN D3 Take 1,000 Units by mouth daily.   cyanocobalamin 1000 MCG/ML injection Commonly known as:  (VITAMIN B-12) Inject 1,000 mcg into the muscle every 30 (thirty) days.   feeding supplement (ENSURE ENLIVE) Liqd Take 237 mLs by mouth 3 (three) times daily between meals.   ipratropium-albuterol 0.5-2.5 (3) MG/3ML Soln Commonly known as:  DUONEB Take 3 mLs by nebulization every 6 (six) hours as needed.   loperamide 2 MG capsule Commonly known as:  IMODIUM Take 2 mg by mouth as needed for diarrhea or loose stools. Take one tablet by mouth after each loose stool for up to 4 doses in 12 hours.   methimazole 5 MG tablet Commonly known as:  TAPAZOLE Take 1 tablet (5 mg total) by mouth daily.   MILK OF MAGNESIA 400 MG/5ML suspension Generic drug:  magnesium hydroxide Take 30 mLs by mouth once as  needed for mild constipation. 6 ounces of Prune juice with Milk of Magnesium   multivitamin with minerals Tabs tablet Take 1 tablet by mouth daily. Start taking on:  November 02, 2018   MYLANTA 200-200-20 MG/5ML suspension Generic drug:  alum & mag hydroxide-simeth Take 30 mLs by mouth once as needed for indigestion or heartburn. Take 30 ml by mouth as needed for 1 dose only   risperiDONE 1 MG tablet Commonly known as:  RISPERDAL Take 1 tablet by mouth 2 (two) times daily. May repeat 1 hour after bedtime dose if needed for agitation.  ROBITUSSIN TO GO COUGH/COLD CF 5-10-100 MG/5ML Liqd Generic drug:  Phenylephrine-DM-GG Take 10 mLs by mouth every 6 (six) hours as needed (for up to 48 hours).   sodium phosphate 7-19 GM/118ML Enem Place 1 enema rectally as needed for severe constipation. Use once for constipation if not relieved by Milk of Mag and Prune juice.     Relevant Imaging Results:  Relevant Lab Results:   Additional Information SSN [id number]  Darleene Cleavernterhaus, Jaslin Novitski R, [place]WA

## 2018-10-31 NOTE — Progress Notes (Signed)
[facility] - [place] [facility]   PATIENT NAME: Shelly Duran    MR#:  [id number]  DATE OF BIRTH:  09/07/1941  SUBJECTIVE:   No concerns this morning.  States that her breathing is completely normal.  She is still having a little bit of a cough.  REVIEW OF SYSTEMS:  Unable to obtain due to dementia DRUG ALLERGIES:  No Known Allergies VITALS:  Blood pressure (!) 150/97, pulse 86, temperature 98.2 F (36.8 C), temperature source Oral, resp. rate 19, height 5\' 4"  (1.626 m), weight 35.9 kg, SpO2 94 %. PHYSICAL EXAMINATION:  Physical Exam  Constitutional:  Chronically ill-appearing, cachectic, laying in bed in no acute distress HEENT: Normocephalic, atraumatic, EOMI, no scleral icterus, moist mucous membranes. Neck: Supple, normal ROM Cardiovascular: RRR, no murmurs, rubs, gallops Respiratory: + Diminished breath sounds throughout all lung fields, no crackles or wheezing, normal work of breathing, [place] in place Gastrointestinal: Soft and nontender. No distention.  Musculoskeletal: No edema, cyanosis, or clubbing Neurologic: CN II through XII grossly intact, + global weakness, sensation intact, no focal deficits Skin:  Skin is warm, dry and intact. No rash noted. Psychiatric: Mood and affect are normal. Speech and behavior are normal. Alert, oriented x 1. LABORATORY PANEL:  Female CBC Recent Labs  Lab 10/30/18 0634  WBC 12.3*  HGB 11.2*  HCT 35.9*  PLT 517*   ------------------------------------------------------------------------------------------------------------------ Chemistries  Recent Labs  Lab 10/28/18 0334 10/28/18 0715  10/30/18 0634 10/31/18 0646  NA 144  --    < > 141  --   K 3.2*  --    < > 4.0  --   CL 109  --    < > 106  --   CO2 28  --    < > 28  --   GLUCOSE 112*  --    < > 129*  --   BUN 18  --    < > 16  --   CREATININE 0.66  --    < > 0.75 0.69  CALCIUM 8.0*  --    < > 8.2*  --   MG  --  1.9  --   --   --   AST 53*  --   --    --   --   ALT 50*  --   --   --   --   ALKPHOS 192*  --   --   --   --   BILITOT 0.7  --   --   --   --    < > = values in this interval not displayed.   RADIOLOGY:  No results found. ASSESSMENT AND PLAN:   Sepsis secondary to multifocal pneumonia- sepsis resolved, leukocytosis improving.  Procalcitonin has been negative for the last 2 days. -Continue Augmentin for a total 5-day course of antibiotics -Blood and urine cultures with no growth to date -PT/OT consult  Acute hypoxic respiratory failure- secondary to multifocal pneumonia in the setting of COPD.  Improving.  Remains on 2 L nasal cannula. -Continue prednisone for a total 5-day course -Wean oxygen as able -Duonebs as needed -Seen by SLP due to concern for aspiration- placed on dysphagia I diet  Hyperthyroidism- CT chest with scattered hypodensities within the thyroid gland.  Thyroid ultrasound performed yesterday with 2 distinct thyroid nodules, 1 of which is located in the right inferior thyroid and meets criteria for ultrasound-guided FNA -TSH low, but T4 is normal -Continue home methimazole -Needs fine-needle  aspiration of thyroid nodule as an outpatient  Episode of sinus tachycardia/PACs/PVCs- occurred today while patient was sleeping.  Heart rates have otherwise been very well controlled.  May be related to above.  No additional episodes. -Cardiac monitoring -Continue metoprolol IV as needed heart rate > 120  Anemia of chronic disease- stable.  Hemoglobin at baseline -Anemia panel unremarkable -Continue to monitor  Advanced dementia-stable, at mental status baseline -Supportive care -Fall precautions  Severe protein calorie malnutrition -Dietitian consult  Patient is medically stable for discharge to SNF.  Awaiting SNF placement.  All the records are reviewed and case discussed with Care Management/Social Worker. Management plans discussed with the patient, family and they are in agreement.  CODE STATUS:  DNR  TOTAL TIME TAKING CARE OF THIS PATIENT: 35 minutes.   More than 50% of the time was spent in counseling/coordination of care: YES  POSSIBLE D/C tomorrow, DEPENDING ON CLINICAL CONDITION.   Jinny Blossom [place] Moyd M.D on 10/31/2018 at 7:51 PM  Between 7am to 6pm - Pager - [phone]  After 6pm go to [email or web address] - Social research officer, government  [facility]  Hospitalists  Office  [phone]  CC: Primary care physician; Patient, No Pcp Per  Note: This dictation was prepared with Dragon dictation along with smaller phrase technology. Any transcriptional errors that result from this process are unintentional.

## 2018-11-01 LAB — PROCALCITONIN: Procalcitonin: <0.1 ng/mL

## 2018-11-01 MED ORDER — ENSURE ENLIVE PO LIQD: 237.0000 mL | Freq: Three times a day (TID) | ORAL | 12 refills | Status: DC

## 2018-11-01 MED ORDER — ADULT MULTIVITAMIN W/MINERALS CH: 1.0000 | ORAL_TABLET | Freq: Every day | ORAL | 0 refills | Status: AC

## 2018-11-01 MED ORDER — AMOXICILLIN-POT CLAVULANATE 400-57 MG/5ML PO SUSR: 875.0000 mg | Freq: Two times a day (BID) | ORAL | 0 refills | Status: DC

## 2018-11-01 NOTE — Progress Notes (Signed)
Ngoc Parmeter to be D/C'd to [facility] per MD order.  Discussed prescriptions and follow up appointments with the patient and Alvis Lemmings from [facility]. Packet given to [employer]  medication list explained in detail. Allergies as of 11/01/2018   No Known Allergies     Medication List    TAKE these medications   acetaminophen 325 MG tablet Commonly known as:  TYLENOL Take 650 mg by mouth every 4 (four) hours as needed (for up to 24 hours).   amoxicillin-clavulanate 400-57 MG/5ML suspension Commonly known as:  AUGMENTIN Take 10.9 mLs (875 mg total) by mouth every 12 (twelve) hours.   bismuth subsalicylate 262 MG/15ML suspension Commonly known as:  PEPTO BISMOL Take 15 mLs by mouth every 2 (two) hours as needed (for up to 6 doses in 24 hours).   cholecalciferol 25 MCG (1000 UT) tablet Commonly known as:  VITAMIN D3 Take 1,000 Units by mouth daily.   cyanocobalamin 1000 MCG/ML injection Commonly known as:  (VITAMIN B-12) Inject 1,000 mcg into the muscle every 30 (thirty) days.   feeding supplement (ENSURE ENLIVE) Liqd Take 237 mLs by mouth 3 (three) times daily between meals.   ipratropium-albuterol 0.5-2.5 (3) MG/3ML Soln Commonly known as:  DUONEB Take 3 mLs by nebulization every 6 (six) hours as needed.   loperamide 2 MG capsule Commonly known as:  IMODIUM Take 2 mg by mouth as needed for diarrhea or loose stools. Take one tablet by mouth after each loose stool for up to 4 doses in 12 hours.   methimazole 5 MG tablet Commonly known as:  TAPAZOLE Take 1 tablet (5 mg total) by mouth daily.   MILK OF MAGNESIA 400 MG/5ML suspension Generic drug:  magnesium hydroxide Take 30 mLs by mouth once as needed for mild constipation. 6 ounces of Prune juice with Milk of Magnesium   multivitamin with minerals Tabs tablet Take 1 tablet by mouth daily. Start taking on:  November 02, 2018   MYLANTA 200-200-20 MG/5ML suspension Generic drug:  alum & mag hydroxide-simeth Take 30  mLs by mouth once as needed for indigestion or heartburn. Take 30 ml by mouth as needed for 1 dose only   risperiDONE 1 MG tablet Commonly known as:  RISPERDAL Take 1 tablet by mouth 2 (two) times daily. May repeat 1 hour after bedtime dose if needed for agitation.   ROBITUSSIN TO GO COUGH/COLD CF 5-10-100 MG/5ML Liqd Generic drug:  Phenylephrine-DM-GG Take 10 mLs by mouth every 6 (six) hours as needed (for up to 48 hours).   sodium phosphate 7-19 GM/118ML Enem Place 1 enema rectally as needed for severe constipation. Use once for constipation if not relieved by Milk of Mag and Prune juice.       Vitals:   11/01/18 0724 11/01/18 1218  BP: (!) 164/95 138/89  Pulse: 70 79  Resp: 20 18  Temp: 98.3 F (36.8 C) (!) 97.4 F (36.3 C)  SpO2: 99% 95%    Tele box removed and returned. Skin clean, dry and intact without evidence of skin break down, no evidence of skin tears noted. IV catheter discontinued intact. Site without signs and symptoms of complications. Dressing and pressure applied. Pt denies pain at this time. No complaints noted.  [name] worth Will transport pt home.    Rigoberto Noel

## 2018-11-01 NOTE — Discharge Summary (Addendum)
Shelly Duran, is a 78 y.o. female  DOB 19-May-1941  MRN [id number].  Admission date:  10/28/2018  Admitting Physician  Barbaraann Rondo, MD  Discharge Date:  11/01/2018   Primary MD  Patient, No Pcp Per  Recommendations for primary care physician for things to follow:   Discharge to [facility] memory care unit, follow-up with PCP there.   Admission Diagnosis  Elevated LFTs [R94.5] HCAP (healthcare-associated pneumonia) [J18.9] Acute on chronic respiratory failure with hypoxemia (HCC) [J96.21] Sepsis, due to unspecified organism, unspecified whether acute organ dysfunction present [facility]) [A41.9]   Discharge Diagnosis  Elevated LFTs [R94.5] HCAP (healthcare-associated pneumonia) [J18.9] Acute on chronic respiratory failure with hypoxemia (HCC) [J96.21] Sepsis, due to unspecified organism, unspecified whether acute organ dysfunction present (HCC) [A41.9]    Active Problems:   Sepsis due to pneumonia [facility])      Past Medical History:  Diagnosis Date  . Alzheimer's dementia (HCC)   . Dementia (HCC)     History reviewed. No pertinent surgical history.     History of present illness and  Hospital Course:     Kindly see H&P for history of present illness and admission details, please review complete Labs, Consult reports and Test reports for all details in brief  HPI  from the history and physical done on the day of admission 78 year old female with history of dementia came from [facility] facility because of shortness of breath, hypoxia with O2 sat 88% on room air at facility patient found to have tachypnea, chest x-ray is concerning for multifocal pneumonia and admitted for the same.   Hospital Course  #1 sepsis present on admission secondary to multifocal pneumonia, patient had leukocytosis,  tachypnea, symptoms improved with IV antibiotics, patient is breathing comfortably at this time, received Vanco and Zosyn, changed to Augmentin on January 6 evening, patient will need 6 more doses of Augmentin to finish the course.  Patient is on 1 L of oxygen and sats 99%.  Wean off oxygen as tolerated.  Speech therapy recommended dysphagia 1 diet, aspiration precautions  #Hyperthyroidism- CT chest with scattered hypodensities within the thyroid gland.  Thyroid ultrasound performed yesterday with 2 distinct thyroid nodules, 1 of which is located in the right inferior thyroid and meets criteria for ultrasound-guided FNA -TSH low, but T4 is normal -Continue home methimazole -Needs fine-needle aspiration of thyroid nodule as an outpatient   Episode of sinus tachycardia/PACs/PVCs- occurred today while patient was sleeping.  Heart rates have otherwise been very well controlled.  May be related to above.  No additional episodes. -  Anemia of chronic disease- stable.  Hemoglobin at baseline -Anemia panel unremarkable - Advanced Alzheimer's dementia-stable, at mental status baseline -Supportive care -Fall precautions  Severe protein calorie malnutritionl continue nutritional shakes    Discharge Condition: Stable   Follow UP      Discharge Instructions  and  Discharge Medications     Allergies as of 11/01/2018   No Known Allergies     Medication List    TAKE these medications   acetaminophen 325 MG tablet Commonly known as:  TYLENOL Take 650 mg by mouth every 4 (four) hours as needed (for up to 24 hours).   amoxicillin-clavulanate 400-57 MG/5ML suspension Commonly known as:  AUGMENTIN Take 10.9 mLs (875 mg total) by mouth every 12 (twelve) hours.   bismuth subsalicylate 262 MG/15ML suspension Commonly known as:  PEPTO BISMOL Take 15 mLs by mouth every 2 (two) hours as needed (for up to 6 doses in  24 hours).   cholecalciferol 25 MCG (1000 UT) tablet Commonly known as:   VITAMIN D3 Take 1,000 Units by mouth daily.   cyanocobalamin 1000 MCG/ML injection Commonly known as:  (VITAMIN B-12) Inject 1,000 mcg into the muscle every 30 (thirty) days.   feeding supplement (ENSURE ENLIVE) Liqd Take 237 mLs by mouth 3 (three) times daily between meals.   ipratropium-albuterol 0.5-2.5 (3) MG/3ML Soln Commonly known as:  DUONEB Take 3 mLs by nebulization every 6 (six) hours as needed.   loperamide 2 MG capsule Commonly known as:  IMODIUM Take 2 mg by mouth as needed for diarrhea or loose stools. Take one tablet by mouth after each loose stool for up to 4 doses in 12 hours.   methimazole 5 MG tablet Commonly known as:  TAPAZOLE Take 1 tablet (5 mg total) by mouth daily.   MILK OF MAGNESIA 400 MG/5ML suspension Generic drug:  magnesium hydroxide Take 30 mLs by mouth once as needed for mild constipation. 6 ounces of Prune juice with Milk of Magnesium   multivitamin with minerals Tabs tablet Take 1 tablet by mouth daily. Start taking on:  November 02, 2018   MYLANTA 200-200-20 MG/5ML suspension Generic drug:  alum & mag hydroxide-simeth Take 30 mLs by mouth once as needed for indigestion or heartburn. Take 30 ml by mouth as needed for 1 dose only   risperiDONE 1 MG tablet Commonly known as:  RISPERDAL Take 1 tablet by mouth 2 (two) times daily. May repeat 1 hour after bedtime dose if needed for agitation.   ROBITUSSIN TO GO COUGH/COLD CF 5-10-100 MG/5ML Liqd Generic drug:  Phenylephrine-DM-GG Take 10 mLs by mouth every 6 (six) hours as needed (for up to 48 hours).   sodium phosphate 7-19 GM/118ML Enem Place 1 enema rectally as needed for severe constipation. Use once for constipation if not relieved by Milk of Mag and Prune juice.         Diet and Activity recommendation: See Discharge Instructions above Diet recommendations: Dysphagia 1 (puree);Thin liquid(pt is edentulous) Liquids provided via: Cup;Straw Medication Administration: Crushed with  puree Supervision: Patient able to self feed;Intermittent supervision to cue for compensatory strategies(Staff to assist as needed during meals) Compensations: Minimize environmental distractions;Slow rate;Small sips/bites;Lingual sweep for clearance of pocketing;Follow solids with liquid Postural Changes and/or Swallow Maneuvers: Out of bed for meals;Seated upright 90 degrees;Upright 30-60 min after meal  Activity: Fall precautions  Consults obtained -physical therapy   Major procedures and Radiology Reports - PLEASE review detailed and final reports for all details, in brief -      Ct Chest Wo Contrast  Result Date: 10/28/2018 CLINICAL DATA:  Acute onset of difficulty breathing. Decreased O2 saturation. EXAM: CT CHEST WITHOUT CONTRAST TECHNIQUE: Multidetector CT imaging of the chest was performed following the standard protocol without IV contrast. COMPARISON:  Chest radiograph performed earlier today at 3:07 a.m. FINDINGS: Cardiovascular: The heart is normal in size. Diffuse coronary artery calcifications are seen. Mild scattered calcification is noted about the thoracic aorta. The great vessels are grossly unremarkable. Mediastinum/Nodes: Visualized mediastinal nodes remain normal in size. No pericardial effusion is identified. Scattered hypodensities within the thyroid gland measure up to 1.6 cm on the left. No axillary lymphadenopathy is seen. Lungs/Pleura: A small right pleural effusion is noted. Patchy right basilar airspace opacities, and minimal left basilar airspace opacity, concerning for multifocal pneumonia. Underlying diffuse emphysema is noted. No pneumothorax is seen. No dominant mass is identified. Upper Abdomen: The visualized portions of the liver and  spleen are unremarkable. The visualized portions of the pancreas are unremarkable. Prominence of the adrenal glands may reflect adrenal hyperplasia. The visualized portions of the kidneys are unremarkable. Musculoskeletal: No acute  osseous abnormalities are identified. The visualized musculature is unremarkable in appearance. IMPRESSION: 1. Small right pleural effusion. Patchy right basilar airspace opacities, and minimal left basilar airspace opacity, concerning for multifocal pneumonia. 2. Underlying diffuse emphysema noted. 3. Diffuse coronary artery calcifications seen. 4. Prominence of the adrenal glands may reflect adrenal hyperplasia. 5. Scattered hypodensities within the thyroid gland measure up to 1.6 cm on the left. Consider further evaluation with thyroid ultrasound. If patient is clinically hyperthyroid, consider nuclear medicine thyroid uptake and scan. Emphysema (ICD10-J43.9). Electronically Signed   By: Roanna Raider M.D.   On: 10/28/2018 05:52   Dg Chest Portable 1 View  Result Date: 10/28/2018 CLINICAL DATA:  Shortness of breath. EXAM: PORTABLE CHEST 1 VIEW COMPARISON:  Frontal and lateral views 09/13/2018. FINDINGS: Chronic hyperinflation and bronchial thickening. Decreased cardiomegaly from prior exam. Improved pleural effusions with probable residual on the right, previously best assessed on the lateral view. Improved patchy opacities in the right midlung zone. No confluent airspace disease. No pneumothorax. Bones are under mineralized. IMPRESSION: 1. Chronic hyperinflation and bronchial thickening, imaging findings consistent with COPD. 2. Improved cardiomegaly and pleural effusions from prior exam. Improved patchy/reticulonodular opacity in the right lung. Electronically Signed   By: Narda Rutherford M.D.   On: 10/28/2018 03:29   US Thyroid  Result Date: 10/29/2018 CLINICAL DATA:  Incidental on CT. Thyroid nodule detected by CT of the chest. EXAM: THYROID ULTRASOUND TECHNIQUE: Ultrasound examination of the thyroid gland and adjacent soft tissues was performed. COMPARISON:  None. FINDINGS: Parenchymal Echotexture: Moderately heterogenous Isthmus: 0.3 cm Right lobe: 4.2 x 2.9 x 2.0 cm Left lobe: 4.1 x 3.1 x 1.8 cm  _________________________________________________________ Estimated total number of nodules >/= 1 cm: 2 Number of spongiform nodules >/=  2 cm not described below (TR1): 0 Number of mixed cystic and solid nodules >/= 1.5 cm not described below (TR2): 0 _________________________________________________________ Nodule # 1: Location: Right; Inferior Maximum size: 2.0 cm; Other 2 dimensions: 1.6 x 2.0 cm Composition: solid/almost completely solid (2) Echogenicity: hypoechoic (2) Shape: taller-than-wide (3) Margins: ill-defined (0) Echogenic foci: macrocalcifications (1) ACR TI-RADS total points: 8. ACR TI-RADS risk category: TR5 (>/= 7 points). ACR TI-RADS recommendations: **Given size (>/= 1.0 cm) and appearance, fine needle aspiration of this highly suspicious nodule should be considered based on TI-RADS criteria. _________________________________________________________ Nodule # 2: Location: Left; Superior Maximum size: 2.0 cm; Other 2 dimensions: 1.0 x 1.6 cm Composition: solid/almost completely solid (2) Echogenicity: isoechoic (1) Shape: not taller-than-wide (0) Margins: ill-defined (0) Echogenic foci: none (0) ACR TI-RADS total points: 3. ACR TI-RADS risk category: TR3 (3 points). ACR TI-RADS recommendations: *Given size (>/= 1.5 - 2.4 cm) and appearance, a follow-up ultrasound in 1 year should be considered based on TI-RADS criteria. _________________________________________________________ No abnormal lymph nodes identified. IMPRESSION: 1. 2 cm vaguely marginated right inferior thyroid nodule meets criteria for elective ultrasound-guided fine-needle aspiration. This can be performed on an outpatient basis when the patient has recovered from acute illness. 2. 2 cm left superior thyroid nodule meets criteria for 1 year follow-up ultrasound. The above is in keeping with the ACR TI-RADS recommendations - J Am Coll Radiol 2017;14:587-595. Electronically Signed   By: Irish Lack M.D.   On: 10/29/2018 09:38     Micro Results    Recent Results (from the past 240  hour(s))  Urine culture     Status: None   Collection Time: 10/28/18  2:59 AM  Result Value Ref Range Status   Specimen Description   Final    URINE, CATHETERIZED Performed at [facility], [address], [place], [place] [address]    Special Requests   Final    NONE Performed at [facility], [address], [place], [place] [address]    Culture   Final    NO GROWTH Performed at [facility], [address], [place], [place] [address]    Report Status 10/29/2018 FINAL  Final  Blood Culture (routine x 2)     Status: None (Preliminary result)   Collection Time: 10/28/18  6:46 AM  Result Value Ref Range Status   Specimen Description BLOOD BLOOD LEFT WRIST  Final   Special Requests   Final    BOTTLES DRAWN AEROBIC AND ANAEROBIC Blood Culture results may not be optimal due to an excessive volume of blood received in culture bottles   Culture   Final    NO GROWTH 4 DAYS Performed at [facility], [address] [place], [place] [address]    Report Status PENDING  Incomplete  Blood Culture (routine x 2)     Status: None (Preliminary result)   Collection Time: 10/28/18  6:46 AM  Result Value Ref Range Status   Specimen Description BLOOD BLOOD LEFT FOREARM  Final   Special Requests   Final    BOTTLES DRAWN AEROBIC AND ANAEROBIC Blood Culture adequate volume   Culture   Final    NO GROWTH 4 DAYS Performed at [facility], [address] [place], [place] [address]    Report Status PENDING  Incomplete  MRSA PCR Screening     Status: None   Collection Time: 10/28/18 10:59 AM  Result Value Ref Range Status   MRSA by PCR NEGATIVE NEGATIVE Final    Comment:        The GeneXpert MRSA Assay (FDA approved for NASAL specimens only), is one component of a comprehensive MRSA colonization surveillance program. It is not intended to diagnose MRSA infection nor to guide or monitor  treatment for MRSA infections. Performed at [facility], [address] [place], [place] [address]        Today   Subjective:   Shelly Duran today seen, not in distress, has baseline dementia.  Objective:   Blood pressure (!) 164/95, pulse 70, temperature 98.3 F (36.8 C), temperature source Oral, resp. rate 20, height 5\' 4"  (1.626 m), weight 33.9 kg, SpO2 99 %.   Intake/Output Summary (Last 24 hours) at 11/01/2018 0830 Last data filed at 11/01/2018 0700 Gross per 24 hour  Intake 0 ml  Output 0 ml  Net 0 ml    Exam  baseline dementia, [place].AT,PERRAL Supple Neck,No JVD, No cervical lymphadenopathy appriciated.  Symmetrical Chest wall movement, Good air movement bilaterally, CTAB RRR,No Gallops,Rubs or new Murmurs, No Parasternal Heave +ve B.Sounds, Abd Soft, Non tender, No organomegaly appriciated, No rebound -guarding or rigidity. No Cyanosis, Clubbing or edema, No new Rash or bruise  Data Review   CBC w Diff:  Lab Results  Component Value Date   WBC 12.3 (H) 10/30/2018   HGB 11.2 (L) 10/30/2018   HCT 35.9 (L) 10/30/2018   PLT 517 (H) 10/30/2018   LYMPHOPCT 2 09/12/2018   MONOPCT 3 09/12/2018   EOSPCT 0 09/12/2018   BASOPCT 0 09/12/2018    CMP:  Lab Results  Component Value Date   NA 141 10/30/2018   K 4.0 10/30/2018   CL 106 10/30/2018   CO2 28 10/30/2018   BUN 16 10/30/2018   CREATININE 0.69 10/31/2018   PROT 6.0 (L) 10/28/2018   ALBUMIN 2.6 (L) 10/28/2018   BILITOT 0.7 10/28/2018   ALKPHOS 192 (H) 10/28/2018   AST 53 (H) 10/28/2018   ALT 50 (H) 10/28/2018  .   Total Time in preparing paper work, data evaluation and todays exam - 35 minutes  Katha HammingSnehalatha Girtha Kilgore M.D on 11/01/2018 at 8:30 AM    Note: This dictation was prepared with Dragon dictation along with smaller phrase technology. Any transcriptional errors that result from this process are unintentional.

## 2018-11-01 NOTE — Clinical Social Work Note (Addendum)
CSW attempted to contact [name] at [facility], left a message awaiting for call back to find out what time she will be able to pick up patient.  2:00pm  CSW received a phone call from [facility] [place], and sh said she will be at hospital to pick up patient around 3:00pm to take her back to [facility].  Ervin Knack. Emmalene Kattner, MSW, Theresia Majors [phone]  11/01/2018 12:28 PM

## 2018-11-01 NOTE — Progress Notes (Signed)
Staff noted that pt bracelet was at the window sill. Pt bracelet was place in a bag and place with the rest of her stuff. Will continue to monitor.

## 2018-11-01 NOTE — Progress Notes (Signed)
[employer] [facility] [place] [facility]   PATIENT NAME: Shelly Duran    MR#:  [id number]  DATE OF BIRTH:  July 18, 1941  SUBJECTIVE:   Ndoes have baseline dementia, does not appear to be any distress.  Waiting for insurance authorization to go to [facility] memory care unit. REVIEW OF SYSTEMS:  Unable to obtain due to dementia DRUG ALLERGIES:  No Known Allergies VITALS:  Blood pressure (!) 164/95, pulse 70, temperature 98.3 F (36.8 C), temperature source Oral, resp. rate 20, height 5\' 4"  (1.626 m), weight 33.9 kg, SpO2 99 %. PHYSICAL EXAMINATION:  Physical Exam HENT:     Right Ear: There is no impacted cerumen.     Constitutional:  Chronically ill-appearing, cachectic, laying in bed in no acute distress HEENT: Normocephalic, atraumatic, EOMI, no scleral icterus, moist mucous membranes. Neck: Supple, normal ROM Cardiovascular: RRR, no murmurs, rubs, gallops Respiratory: + Diminished breath sounds throughout all lung fields, no crackles or wheezing, normal work of breathing, [place] in place Gastrointestinal: Soft and nontender. No distention.  Musculoskeletal: No edema, cyanosis, or clubbing Neurologic: CN II through XII grossly intact, + global weakness, sensation intact, no focal deficits Skin:  Skin is warm, dry and intact. No rash noted. Psychiatric: Mood and affect are normal. Speech and behavior are normal. Alert, oriented x 1. LABORATORY PANEL:  Female CBC Recent Labs  Lab 10/30/18 0634  WBC 12.3*  HGB 11.2*  HCT 35.9*  PLT 517*   ------------------------------------------------------------------------------------------------------------------ Chemistries  Recent Labs  Lab 10/28/18 0334 10/28/18 0715  10/30/18 0634 10/31/18 0646  NA 144  --    < > 141  --   K 3.2*  --    < > 4.0  --   CL 109  --    < > 106  --   CO2 28  --    < > 28  --   GLUCOSE 112*  --    < > 129*  --   BUN 18  --    < > 16  --   CREATININE 0.66  --    < > 0.75 0.69  CALCIUM  8.0*  --    < > 8.2*  --   MG  --  1.9  --   --   --   AST 53*  --   --   --   --   ALT 50*  --   --   --   --   ALKPHOS 192*  --   --   --   --   BILITOT 0.7  --   --   --   --    < > = values in this interval not displayed.   RADIOLOGY:  No results found. ASSESSMENT AND PLAN:   Sepsis secondary to multifocal pneumonia- sepsis resolved, leukocytosis improving.  Procalcitonin has been negative for the last 2 days. -Continue Augmentin for a total 5-day course of antibiotics -Blood and urine cultures with no growth to date   Acute hypoxic respiratory failure- secondary to multifocal pneumonia in the setting of COPD.  Improving.  Remains on 2 L nasal cannula. -Continue prednisone for a total 5-day course -Wean oxygen as able -Duonebs as needed -Seen by SLP due to concern for aspiration- placed on dysphagia I diet  Hyperthyroidism- CT chest with scattered hypodensities within the thyroid gland.  Thyroid ultrasound performed yesterday with 2 distinct thyroid nodules, 1 of which is located in the right inferior thyroid and meets criteria for  ultrasound-guided FNA -TSH low, but T4 is normal -Continue home methimazole -Needs fine-needle aspiration of thyroid nodule as an outpatient  Episode of sinus tachycardia/PACs/PVCs- occurred today while patient was sleeping.  Heart rates have otherwise been very well controlled.  May be related to above.  No additional episodes. -Cardiac monitoring -Continue metoprolol IV as needed heart rate > 120  Anemia of chronic disease- stable.  Hemoglobin at baseline -Anemia panel unremarkable -Continue to monitor  Advanced dementia-stable, at mental status baseline -Supportive care -Fall precautions, discharge to previous ALF when insurance is approved.  Patient will go back to [facility] memory care unit  Severe protein calorie malnutrition -Dietitian consult  Patient is medically stable for discharge to SNF.  Awaiting SNF placement.  All the  records are reviewed and case discussed with Care Management/Social Worker. Management plans discussed with the patient, family and they are in agreement.  CODE STATUS: DNR  TOTAL TIME TAKING CARE OF THIS PATIENT: 35 minutes.   More than 50% of the time was spent in counseling/coordination of care: YES  POSSIBLE D/C tomorrow, DEPENDING ON CLINICAL CONDITION.   Katha Hamming M.D on 11/01/2018 at 8:18 AM  Between 7am to 6pm - Pager - [phone]  After 6pm go to [email or web address] - Social research officer, government  [facility]  Office  [phone]  CC: Primary care physician; Patient, No Pcp Per  Note: This dictation was prepared with Dragon dictation along with smaller phrase technology. Any transcriptional errors that result from this process are unintentional.

## 2018-11-02 LAB — CULTURE, BLOOD (ROUTINE X 2)
CULTURE: NO GROWTH
Culture: NO GROWTH
Special Requests: ADEQUATE

## 2018-11-02 LAB — HIV ANTIBODY (ROUTINE TESTING W REFLEX): HIV Screen 4th Generation wRfx: NONREACTIVE

## 2019-10-02 ENCOUNTER — Emergency Department: Payer: Medicare Other

## 2019-10-02 ENCOUNTER — Other Ambulatory Visit: Payer: Self-pay

## 2019-10-02 ENCOUNTER — Inpatient Hospital Stay
Admission: EM | Admit: 2019-10-02 | Discharge: 2019-10-06 | DRG: 189 | Disposition: A | Discharge status: Skilled Nursing Facility | Payer: Medicare Other | Source: Skilled Nursing Facility | Attending: Internal Medicine | Admitting: Internal Medicine

## 2019-10-02 DIAGNOSIS — Z8619 Personal history of other infectious and parasitic diseases: Secondary | ICD-10-CM | POA: Diagnosis not present

## 2019-10-02 DIAGNOSIS — G309 Alzheimer's disease, unspecified: Secondary | ICD-10-CM | POA: Diagnosis present

## 2019-10-02 DIAGNOSIS — Z8701 Personal history of pneumonia (recurrent): Secondary | ICD-10-CM | POA: Diagnosis not present

## 2019-10-02 DIAGNOSIS — F0391 Unspecified dementia with behavioral disturbance: Secondary | ICD-10-CM | POA: Diagnosis not present

## 2019-10-02 DIAGNOSIS — Z8673 Personal history of transient ischemic attack (TIA), and cerebral infarction without residual deficits: Secondary | ICD-10-CM | POA: Diagnosis not present

## 2019-10-02 DIAGNOSIS — F0281 Dementia in other diseases classified elsewhere with behavioral disturbance: Secondary | ICD-10-CM | POA: Diagnosis present

## 2019-10-02 DIAGNOSIS — E0590 Thyrotoxicosis, unspecified without thyrotoxic crisis or storm: Secondary | ICD-10-CM | POA: Diagnosis present

## 2019-10-02 DIAGNOSIS — F17200 Nicotine dependence, unspecified, uncomplicated: Secondary | ICD-10-CM | POA: Diagnosis present

## 2019-10-02 DIAGNOSIS — E43 Unspecified severe protein-calorie malnutrition: Secondary | ICD-10-CM | POA: Diagnosis present

## 2019-10-02 DIAGNOSIS — Z9981 Dependence on supplemental oxygen: Secondary | ICD-10-CM | POA: Diagnosis not present

## 2019-10-02 DIAGNOSIS — R0902 Hypoxemia: Secondary | ICD-10-CM | POA: Diagnosis not present

## 2019-10-02 DIAGNOSIS — J9602 Acute respiratory failure with hypercapnia: Secondary | ICD-10-CM | POA: Diagnosis present

## 2019-10-02 DIAGNOSIS — Z20828 Contact with and (suspected) exposure to other viral communicable diseases: Secondary | ICD-10-CM | POA: Diagnosis present

## 2019-10-02 DIAGNOSIS — J439 Emphysema, unspecified: Secondary | ICD-10-CM | POA: Diagnosis present

## 2019-10-02 DIAGNOSIS — J441 Chronic obstructive pulmonary disease with (acute) exacerbation: Secondary | ICD-10-CM | POA: Diagnosis present

## 2019-10-02 DIAGNOSIS — E039 Hypothyroidism, unspecified: Secondary | ICD-10-CM | POA: Diagnosis not present

## 2019-10-02 DIAGNOSIS — J189 Pneumonia, unspecified organism: Secondary | ICD-10-CM | POA: Diagnosis present

## 2019-10-02 DIAGNOSIS — Z66 Do not resuscitate: Secondary | ICD-10-CM | POA: Diagnosis present

## 2019-10-02 DIAGNOSIS — R911 Solitary pulmonary nodule: Secondary | ICD-10-CM | POA: Diagnosis present

## 2019-10-02 DIAGNOSIS — Z6820 Body mass index (BMI) 20.0-20.9, adult: Secondary | ICD-10-CM

## 2019-10-02 DIAGNOSIS — J9601 Acute respiratory failure with hypoxia: Principal | ICD-10-CM | POA: Diagnosis present

## 2019-10-02 DIAGNOSIS — Z79899 Other long term (current) drug therapy: Secondary | ICD-10-CM

## 2019-10-02 DIAGNOSIS — R7989 Other specified abnormal findings of blood chemistry: Secondary | ICD-10-CM | POA: Diagnosis not present

## 2019-10-02 DIAGNOSIS — E8770 Fluid overload, unspecified: Secondary | ICD-10-CM | POA: Diagnosis not present

## 2019-10-02 LAB — TROPONIN I (HIGH SENSITIVITY)
Troponin I (High Sensitivity): 17 ng/L (ref <18)
Troponin I (High Sensitivity): 42 ng/L — ABNORMAL HIGH (ref <18)

## 2019-10-02 LAB — COMPREHENSIVE METABOLIC PANEL
ALT: 27 U/L (ref 0–44)
AST: 44 U/L — ABNORMAL HIGH (ref 15–41)
Albumin: 3.7 g/dL (ref 3.5–5.0)
Alkaline Phosphatase: 130 U/L — ABNORMAL HIGH (ref 38–126)
Anion gap: 12 (ref 5–15)
BUN: 19 mg/dL (ref 8–23)
CO2: 24 mmol/L (ref 22–32)
Calcium: 8.1 mg/dL — ABNORMAL LOW (ref 8.9–10.3)
Chloride: 99 mmol/L (ref 98–111)
Creatinine, Ser: 1.01 mg/dL — ABNORMAL HIGH (ref 0.44–1.00)
GFR calc Af Amer: >60 mL/min (ref >60)
GFR calc non Af Amer: 54 mL/min — ABNORMAL LOW (ref >60)
Glucose, Bld: 326 mg/dL — ABNORMAL HIGH (ref 70–99)
Potassium: 4.5 mmol/L (ref 3.5–5.1)
Sodium: 135 mmol/L (ref 135–145)
Total Bilirubin: 1.1 mg/dL (ref 0.3–1.2)
Total Protein: 6.7 g/dL (ref 6.5–8.1)

## 2019-10-02 LAB — URINALYSIS, COMPLETE (UACMP) WITH MICROSCOPIC
Bacteria, UA: NONE SEEN
Bilirubin Urine: NEGATIVE
Glucose, UA: >=500 mg/dL — AB
Hgb urine dipstick: NEGATIVE
Ketones, ur: NEGATIVE mg/dL
Leukocytes,Ua: NEGATIVE
Nitrite: NEGATIVE
Protein, ur: NEGATIVE mg/dL
Specific Gravity, Urine: 1.011 (ref 1.005–1.030)
Squamous Epithelial / HPF: NONE SEEN (ref 0–5)
pH: 6 (ref 5.0–8.0)

## 2019-10-02 LAB — CBC WITH DIFFERENTIAL/PLATELET
Abs Immature Granulocytes: 0.17 10*3/uL — ABNORMAL HIGH (ref 0.00–0.07)
Basophils Absolute: 0.1 10*3/uL (ref 0.0–0.1)
Basophils Relative: 0 %
Eosinophils Absolute: 0.1 10*3/uL (ref 0.0–0.5)
Eosinophils Relative: 0 %
HCT: 42.2 % (ref 36.0–46.0)
Hemoglobin: 13.6 g/dL (ref 12.0–15.0)
Immature Granulocytes: 1 %
Lymphocytes Relative: 6 %
Lymphs Abs: 1.3 10*3/uL (ref 0.7–4.0)
MCH: 30.6 pg (ref 26.0–34.0)
MCHC: 32.2 g/dL (ref 30.0–36.0)
MCV: 95 fL (ref 80.0–100.0)
Monocytes Absolute: 1.3 10*3/uL — ABNORMAL HIGH (ref 0.1–1.0)
Monocytes Relative: 6 %
Neutro Abs: 18.7 10*3/uL — ABNORMAL HIGH (ref 1.7–7.7)
Neutrophils Relative %: 87 %
Platelets: 401 10*3/uL — ABNORMAL HIGH (ref 150–400)
RBC: 4.44 MIL/uL (ref 3.87–5.11)
RDW: 14 % (ref 11.5–15.5)
WBC: 21.6 10*3/uL — ABNORMAL HIGH (ref 4.0–10.5)
nRBC: 0 % (ref 0.0–0.2)

## 2019-10-02 LAB — BLOOD GAS, VENOUS
Acid-Base Excess: 0.2 mmol/L (ref 0.0–2.0)
Acid-Base Excess: 1 mmol/L (ref 0.0–2.0)
Bicarbonate: 29.7 mmol/L — ABNORMAL HIGH (ref 20.0–28.0)
Bicarbonate: 30.6 mmol/L — ABNORMAL HIGH (ref 20.0–28.0)
O2 Saturation: 69.8 %
O2 Saturation: 66.9 %
Patient temperature: 37
Patient temperature: 37
pCO2, Ven: 71 mmHg (ref 44.0–60.0)
pCO2, Ven: 73 mmHg (ref 44.0–60.0)
pH, Ven: 7.23 — ABNORMAL LOW (ref 7.250–7.430)
pH, Ven: 7.23 — ABNORMAL LOW (ref 7.250–7.430)
pO2, Ven: 44 mmHg (ref 32.0–45.0)
pO2, Ven: 42 mmHg (ref 32.0–45.0)

## 2019-10-02 LAB — RESPIRATORY PANEL BY RT PCR (FLU A&B, COVID)
Influenza A by PCR: NEGATIVE
Influenza B by PCR: NEGATIVE
SARS Coronavirus 2 by RT PCR: NEGATIVE

## 2019-10-02 LAB — PROCALCITONIN: Procalcitonin: <0.1 ng/mL

## 2019-10-02 LAB — FIBRIN DERIVATIVES D-DIMER (ARMC ONLY): Fibrin derivatives D-dimer (ARMC): 943.03 ng/mL (FEU) — ABNORMAL HIGH (ref 0.00–499.00)

## 2019-10-02 LAB — POC SARS CORONAVIRUS 2 AG: SARS Coronavirus 2 Ag: NEGATIVE

## 2019-10-02 LAB — LACTIC ACID, PLASMA
Lactic Acid, Venous: 3.4 mmol/L (ref 0.5–1.9)
Lactic Acid, Venous: 3.2 mmol/L (ref 0.5–1.9)

## 2019-10-02 LAB — BRAIN NATRIURETIC PEPTIDE: B Natriuretic Peptide: 98 pg/mL (ref 0.0–100.0)

## 2019-10-02 MED ORDER — MAGNESIUM HYDROXIDE 400 MG/5ML PO SUSP
30.0000 mL | Freq: Once | ORAL | Status: DC | PRN
  Administered 2019-10-06: 30 mL via ORAL
  Filled 2019-10-02: qty 30

## 2019-10-02 MED ORDER — ONDANSETRON HCL 4 MG PO TABS: 4.0000 mg | ORAL_TABLET | Freq: Four times a day (QID) | ORAL | Status: DC | PRN

## 2019-10-02 MED ORDER — ONDANSETRON HCL 4 MG/2ML IJ SOLN: 4.0000 mg | Freq: Four times a day (QID) | INTRAMUSCULAR | Status: DC | PRN

## 2019-10-02 MED ORDER — ADULT MULTIVITAMIN W/MINERALS CH
1.0000 | ORAL_TABLET | Freq: Every day | ORAL | Status: DC
  Administered 2019-10-03 – 2019-10-06 (×4): 1 via ORAL
  Filled 2019-10-02 (×4): qty 1

## 2019-10-02 MED ORDER — ENOXAPARIN SODIUM 40 MG/0.4ML ~~LOC~~ SOLN
40.0000 mg | SUBCUTANEOUS | Status: DC
  Administered 2019-10-02 – 2019-10-05 (×4): 40 mg via SUBCUTANEOUS
  Filled 2019-10-02 (×4): qty 0.4

## 2019-10-02 MED ORDER — IPRATROPIUM-ALBUTEROL 0.5-2.5 (3) MG/3ML IN SOLN
RESPIRATORY_TRACT | Status: AC
  Filled 2019-10-02: qty 6

## 2019-10-02 MED ORDER — SODIUM CHLORIDE 0.9 % IV BOLUS
1000.0000 mL | Freq: Once | INTRAVENOUS | Status: AC
  Administered 2019-10-02: 1000 mL via INTRAVENOUS

## 2019-10-02 MED ORDER — ACETAMINOPHEN 650 MG RE SUPP: 650.0000 mg | Freq: Four times a day (QID) | RECTAL | Status: DC | PRN

## 2019-10-02 MED ORDER — ALUM & MAG HYDROXIDE-SIMETH 200-200-20 MG/5ML PO SUSP: 30.0000 mL | Freq: Once | ORAL | Status: DC | PRN

## 2019-10-02 MED ORDER — IPRATROPIUM-ALBUTEROL 0.5-2.5 (3) MG/3ML IN SOLN: 3.0000 mL | Freq: Once | RESPIRATORY_TRACT | Status: DC

## 2019-10-02 MED ORDER — ACETAMINOPHEN 325 MG PO TABS: 650.0000 mg | ORAL_TABLET | Freq: Four times a day (QID) | ORAL | Status: DC | PRN

## 2019-10-02 MED ORDER — CYANOCOBALAMIN 1000 MCG/ML IJ SOLN
1000.0000 ug | INTRAMUSCULAR | Status: DC
  Administered 2019-10-05: 1000 ug via INTRAMUSCULAR
  Filled 2019-10-02: qty 1

## 2019-10-02 MED ORDER — IPRATROPIUM-ALBUTEROL 0.5-2.5 (3) MG/3ML IN SOLN
3.0000 mL | Freq: Once | RESPIRATORY_TRACT | Status: AC
  Administered 2019-10-02: 3 mL via RESPIRATORY_TRACT

## 2019-10-02 MED ORDER — SODIUM CHLORIDE 0.9 % IV SOLN
INTRAVENOUS | Status: DC
  Administered 2019-10-02 – 2019-10-03 (×2): via INTRAVENOUS

## 2019-10-02 MED ORDER — MAGNESIUM HYDROXIDE 400 MG/5ML PO SUSP: 30.0000 mL | Freq: Every day | ORAL | Status: DC | PRN

## 2019-10-02 MED ORDER — TRAZODONE HCL 50 MG PO TABS
50.0000 mg | ORAL_TABLET | Freq: Every evening | ORAL | Status: DC | PRN
  Filled 2019-10-02: qty 1

## 2019-10-02 MED ORDER — SODIUM CHLORIDE 0.9 % IV SOLN
500.0000 mg | INTRAVENOUS | Status: DC
  Administered 2019-10-03 – 2019-10-05 (×3): 500 mg via INTRAVENOUS
  Filled 2019-10-02 (×4): qty 500

## 2019-10-02 MED ORDER — RISPERIDONE 1 MG PO TABS
1.0000 mg | ORAL_TABLET | Freq: Two times a day (BID) | ORAL | Status: DC
  Administered 2019-10-02 – 2019-10-06 (×7): 1 mg via ORAL
  Filled 2019-10-02 (×9): qty 1

## 2019-10-02 MED ORDER — SODIUM CHLORIDE 0.9 % IV SOLN
1.0000 g | INTRAVENOUS | Status: DC
  Filled 2019-10-02: qty 10

## 2019-10-02 MED ORDER — ALBUTEROL SULFATE (2.5 MG/3ML) 0.083% IN NEBU
2.5000 mg | INHALATION_SOLUTION | Freq: Once | RESPIRATORY_TRACT | Status: AC
  Administered 2019-10-02: 21:00:00 2.5 mg via RESPIRATORY_TRACT
  Filled 2019-10-02: qty 3

## 2019-10-02 MED ORDER — METHIMAZOLE 5 MG PO TABS
5.0000 mg | ORAL_TABLET | Freq: Every day | ORAL | Status: DC
  Administered 2019-10-03 – 2019-10-06 (×4): 5 mg via ORAL
  Filled 2019-10-02 (×4): qty 1

## 2019-10-02 MED ORDER — SODIUM CHLORIDE 0.9 % IV SOLN
2.0000 g | Freq: Once | INTRAVENOUS | Status: AC
  Administered 2019-10-02: 2 g via INTRAVENOUS
  Filled 2019-10-02: qty 2

## 2019-10-02 MED ORDER — IPRATROPIUM-ALBUTEROL 0.5-2.5 (3) MG/3ML IN SOLN
3.0000 mL | Freq: Four times a day (QID) | RESPIRATORY_TRACT | Status: DC
  Administered 2019-10-02 – 2019-10-04 (×6): 3 mL via RESPIRATORY_TRACT
  Filled 2019-10-02 (×6): qty 3

## 2019-10-02 MED ORDER — IPRATROPIUM-ALBUTEROL 0.5-2.5 (3) MG/3ML IN SOLN: 3.0000 mL | Freq: Four times a day (QID) | RESPIRATORY_TRACT | Status: DC | PRN

## 2019-10-02 MED ORDER — BISMUTH SUBSALICYLATE 262 MG/15ML PO SUSP
15.0000 mL | ORAL | Status: DC | PRN
  Filled 2019-10-02: qty 118

## 2019-10-02 MED ORDER — GUAIFENESIN ER 600 MG PO TB12
600.0000 mg | ORAL_TABLET | Freq: Two times a day (BID) | ORAL | Status: DC
  Administered 2019-10-02 – 2019-10-06 (×8): 600 mg via ORAL
  Filled 2019-10-02 (×8): qty 1

## 2019-10-02 MED ORDER — SODIUM CHLORIDE 0.9 % IV SOLN
500.0000 mg | Freq: Once | INTRAVENOUS | Status: AC
  Administered 2019-10-02: 500 mg via INTRAVENOUS
  Filled 2019-10-02: qty 500

## 2019-10-02 MED ORDER — FLEET ENEMA 7-19 GM/118ML RE ENEM: 1.0000 | ENEMA | RECTAL | Status: DC | PRN

## 2019-10-02 MED ORDER — TRAZODONE HCL 50 MG PO TABS: 25.0000 mg | ORAL_TABLET | Freq: Every evening | ORAL | Status: DC | PRN

## 2019-10-02 MED ORDER — HYDROCOD POLST-CPM POLST ER 10-8 MG/5ML PO SUER: 5.0000 mL | Freq: Two times a day (BID) | ORAL | Status: DC | PRN

## 2019-10-02 MED ORDER — VITAMIN D 25 MCG (1000 UNIT) PO TABS
1000.0000 [IU] | ORAL_TABLET | Freq: Every day | ORAL | Status: DC
  Administered 2019-10-03 – 2019-10-06 (×4): 1000 [IU] via ORAL
  Filled 2019-10-02 (×5): qty 1

## 2019-10-02 NOTE — ED Notes (Signed)
Date and time results received: 10/02/19 11:16 PM  Test: Troponin Critical Value: 47  Name of Provider Notified: Mansy MD

## 2019-10-02 NOTE — Progress Notes (Signed)
PHARMACY -  BRIEF ANTIBIOTIC NOTE   Pharmacy has received consult(s) for Cefepime from an ED provider.  The patient's profile has been reviewed for ht/wt/allergies/indication/available labs.    One time order(s) placed for Cefepime 2 gm IV X 1 .   Further antibiotics/pharmacy consults should be ordered by admitting physician if indicated.                       Thank you, Annajulia Lewing D 10/02/2019  8:56 PM

## 2019-10-02 NOTE — ED Provider Notes (Signed)
[facility] Emergency Department Provider Note    First MD Initiated Contact with Patient 10/02/19 1930     (approximate)  I have reviewed the triage vital signs and the nursing notes.   HISTORY  Chief Complaint Respiratory Distress    HPI Shelly Duran is a 78 y.o. female close past medical history presents the ER reportedly unresponsive found hypoxic respiratory living with concern for aspiration event.  EMS found patient with O2 sats in the low 40s.  She does have a history of COPD.  They did provide some assisted BVM.  O2 sat on arrival to the ER is 100%. .  She is able to answer yes/no questions.  Does have bilateral rhonchi as well as crackles particularly right base.  Will follow simple commands.   Past Medical History:  Diagnosis Date  . Alzheimer's dementia ([place])   . Dementia ([place])    Family History  Family history unknown: Yes   History reviewed. No pertinent surgical history. Patient Active Problem List   Diagnosis Date Noted  . Sepsis due to pneumonia ([place]) 10/28/2018  . Protein-calorie malnutrition, severe 09/12/2018  . COPD exacerbation ([place])   . Acute respiratory failure ([place])   . Multifocal pneumonia   . Palliative care encounter   . Sepsis ([place]) 09/10/2018  . 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 ELEVATED BP READING WITHOUT DX HYPERTENSION 03/10/2010      Prior to Admission medications   Medication Sig Start Date End Date Taking? Authorizing Provider  acetaminophen (TYLENOL) 325 MG tablet Take 650 mg by mouth every 4 (four) hours as needed (for up to 24 hours).   Yes [provider]  alum & mag hydroxide-simeth (MYLANTA) 200-200-20 MG/5ML suspension Take 30 mLs by mouth once as needed for indigestion or heartburn. Take 30 ml by mouth as needed for 1 dose only   Yes [provider]  bismuth subsalicylate (PEPTO BISMOL) 262 MG/15ML suspension Take 15 mLs by mouth every 2 (two) hours as needed (for up to 6  doses in 24 hours).   Yes [provider]  cholecalciferol (VITAMIN D3) 25 MCG (1000 UT) tablet Take 1,000 Units by mouth daily.   Yes [provider]  ipratropium-albuterol (DUONEB) 0.5-2.5 (3) MG/3ML SOLN Take 3 mLs by nebulization every 6 (six) hours as needed. 09/14/18  Yes Gladstone Lighter, MD  loperamide (IMODIUM) 2 MG capsule Take 2 mg by mouth as needed for diarrhea or loose stools. Take one tablet by mouth after each loose stool for up to 4 doses in 12 hours.   Yes [provider]  magnesium hydroxide (MILK OF MAGNESIA) 400 MG/5ML suspension Take 30 mLs by mouth once as needed for mild constipation. 6 ounces of Prune juice with Milk of Magnesium   Yes [provider]  methimazole (TAPAZOLE) 5 MG tablet Take 1 tablet (5 mg total) by mouth daily. 09/14/18  Yes Gladstone Lighter, MD  Multiple Vitamin (MULTIVITAMIN WITH MINERALS) TABS tablet Take 1 tablet by mouth daily. 11/02/18  Yes Epifanio Lesches, MD  Phenylephrine-DM-GG (ROBITUSSIN TO GO COUGH/COLD CF) 5-10-100 MG/5ML LIQD Take 10 mLs by mouth every 6 (six) hours as needed (for up to 48 hours).   Yes [provider]  risperiDONE (RISPERDAL) 1 MG tablet Take 1 tablet by mouth 2 (two) times daily. May repeat 1 hour after bedtime dose if needed for agitation. 08/18/18  Yes [provider]  sodium phosphate (FLEET) 7-19 GM/118ML ENEM Place 1 enema rectally as needed for severe  constipation. Use once for constipation if not relieved by Milk of Mag and Prune juice.   Yes [provider]  traZODone (DESYREL) 50 MG tablet Take 1 tablet by mouth at bedtime as needed. 09/28/19  Yes [provider]  cyanocobalamin (,VITAMIN B-12,) 1000 MCG/ML injection Inject 1,000 mcg into the muscle every 30 (thirty) days.    [provider]    Allergies Patient has no known allergies.    Social History Social History   Tobacco Use  . Smoking status: Current Every Day Smoker   . Smokeless tobacco: Never Used  Substance Use Topics  . Alcohol use: Never    Frequency: Never  . Drug use: Never    Review of Systems Patient denies headaches, rhinorrhea, blurry vision, numbness, shortness of breath, chest pain, edema, cough, abdominal pain, nausea, vomiting, diarrhea, dysuria, fevers, rashes or hallucinations unless otherwise stated above in HPI. ____________________________________________   PHYSICAL EXAM:  VITAL SIGNS: Vitals:   10/02/19 2049 10/02/19 2154  BP: 117/81 (!) 132/93  Pulse: 95 90  Resp: (!) 28 (!) 23  Temp:    SpO2: 99% 97%    Constitutional: critically ill appearing, frail  Eyes: Conjunctivae are normal.  Head: Atraumatic. Nose: No congestion/rhinnorhea. Mouth/Throat: Mucous membranes are moist.   Neck: No stridor. Painless ROM.  Cardiovascular: tachycardic regular rhythm. Grossly normal heart sounds.  Good peripheral circulation. Respiratory: tachypnea with acute respiratory failure with hypoxia, expiratory wheeze with inspiratory rhonchi Gastrointestinal: Soft and nontender. No distention. No abdominal bruits. No CVA tenderness. Genitourinary: normal external genitalia Musculoskeletal: No lower extremity tenderness nor edema.  No joint effusions. Neurologic:  Will answer yes and no to question, will follow one step commands, will cross midline Skin:  Skin is warm, dry and intact. No rash noted. Psychiatric: Mood and affect are normal. Speech and behavior are normal.  ____________________________________________   LABS (all labs ordered are listed, but only abnormal results are displayed)  Results for orders placed or performed during the hospital encounter of 10/02/19 (from the past 24 hour(s))  Lactic acid, plasma     Status: Abnormal   Collection Time: 10/02/19  7:35 PM  Result Value Ref Range   Lactic Acid, Venous 3.4 (HH) 0.5 - 1.9 mmol/L  Comprehensive metabolic panel     Status: Abnormal   Collection Time: 10/02/19  7:35  PM  Result Value Ref Range   Sodium 135 135 - 145 mmol/L   Potassium 4.5 3.5 - 5.1 mmol/L   Chloride 99 98 - 111 mmol/L   CO2 24 22 - 32 mmol/L   Glucose, Bld 326 (H) 70 - 99 mg/dL   BUN 19 8 - 23 mg/dL   Creatinine, Ser 1.611.01 (H) 0.44 - 1.00 mg/dL   Calcium 8.1 (L) 8.9 - 10.3 mg/dL   Total Protein 6.7 6.5 - 8.1 g/dL   Albumin 3.7 3.5 - 5.0 g/dL   AST 44 (H) 15 - 41 U/L   ALT 27 0 - 44 U/L   Alkaline Phosphatase 130 (H) 38 - 126 U/L   Total Bilirubin 1.1 0.3 - 1.2 mg/dL   GFR calc non Af Amer 54 (L) >60 mL/min   GFR calc Af Amer >60 >60 mL/min   Anion gap 12 5 - 15  CBC WITH DIFFERENTIAL     Status: Abnormal   Collection Time: 10/02/19  7:35 PM  Result Value Ref Range   WBC 21.6 (H) 4.0 - 10.5 K/uL   RBC 4.44 3.87 - 5.11 MIL/uL  Hemoglobin 13.6 12.0 - 15.0 g/dL   HCT 20.3 55.9 - 74.1 %   MCV 95.0 80.0 - 100.0 fL   MCH 30.6 26.0 - 34.0 pg   MCHC 32.2 30.0 - 36.0 g/dL   RDW 63.8 45.3 - 64.6 %   Platelets 401 (H) 150 - 400 K/uL   nRBC 0.0 0.0 - 0.2 %   Neutrophils Relative % 87 %   Neutro Abs 18.7 (H) 1.7 - 7.7 K/uL   Lymphocytes Relative 6 %   Lymphs Abs 1.3 0.7 - 4.0 K/uL   Monocytes Relative 6 %   Monocytes Absolute 1.3 (H) 0.1 - 1.0 K/uL   Eosinophils Relative 0 %   Eosinophils Absolute 0.1 0.0 - 0.5 K/uL   Basophils Relative 0 %   Basophils Absolute 0.1 0.0 - 0.1 K/uL   Immature Granulocytes 1 %   Abs Immature Granulocytes 0.17 (H) 0.00 - 0.07 K/uL  Procalcitonin     Status: None   Collection Time: 10/02/19  7:35 PM  Result Value Ref Range   Procalcitonin <0.10 ng/mL  Blood gas, venous     Status: Abnormal   Collection Time: 10/02/19  7:35 PM  Result Value Ref Range   pH, Ven 7.23 (L) 7.250 - 7.430   pCO2, Ven 71 (HH) 44.0 - 60.0 mmHg   pO2, Ven 44.0 32.0 - 45.0 mmHg   Bicarbonate 29.7 (H) 20.0 - 28.0 mmol/L   Acid-Base Excess 0.2 0.0 - 2.0 mmol/L   O2 Saturation 69.8 %   Patient temperature 37.0    Collection site VENOUS    Sample type VENOUS   Troponin  I (High Sensitivity)     Status: None   Collection Time: 10/02/19  7:35 PM  Result Value Ref Range   Troponin I (High Sensitivity) 17 <18 ng/L  Brain natriuretic peptide     Status: None   Collection Time: 10/02/19  7:35 PM  Result Value Ref Range   B Natriuretic Peptide 98.0 0.0 - 100.0 pg/mL  POC SARS Coronavirus 2 Ag     Status: None   Collection Time: 10/02/19  8:27 PM  Result Value Ref Range   SARS Coronavirus 2 Ag NEGATIVE NEGATIVE  Respiratory Panel by RT PCR (Flu A&B, Covid) - Nasopharyngeal Swab     Status: None   Collection Time: 10/02/19  8:44 PM   Specimen: Nasopharyngeal Swab  Result Value Ref Range   SARS Coronavirus 2 by RT PCR NEGATIVE NEGATIVE   Influenza A by PCR NEGATIVE NEGATIVE   Influenza B by PCR NEGATIVE NEGATIVE  Blood gas, venous     Status: Abnormal   Collection Time: 10/02/19  9:32 PM  Result Value Ref Range   pH, Ven 7.23 (L) 7.250 - 7.430   pCO2, Ven 73 (HH) 44.0 - 60.0 mmHg   pO2, Ven 42.0 32.0 - 45.0 mmHg   Bicarbonate 30.6 (H) 20.0 - 28.0 mmol/L   Acid-Base Excess 1.0 0.0 - 2.0 mmol/L   O2 Saturation 66.9 %   Patient temperature 37.0    Collection site VENOUS    Sample type VENOUS    ____________________________________________  EKG My review and personal interpretation at Time: 19:30   Indication: resp distress  Rate: 95  Rhythm: sinus Axis: left Other: wandering artifact,no clear stemi criteria ____________________________________________  RADIOLOGY  I personally reviewed all radiographic images ordered to evaluate for the above acute complaints and reviewed radiology reports and findings.  These findings were personally discussed with the patient.  Please see  medical record for radiology report.  ____________________________________________   PROCEDURES  Procedure(s) performed:  .Critical Care Performed by: Willy Eddy, MD Authorized by: Willy Eddy, MD   Critical care provider statement:    Critical care time  (minutes):  30   Critical care time was exclusive of:  Separately billable procedures and treating other patients   Critical care was necessary to treat or prevent imminent or life-threatening deterioration of the following conditions:  Respiratory failure   Critical care was time spent personally by me on the following activities:  Development of treatment plan with patient or surrogate, discussions with consultants, evaluation of patient's response to treatment, examination of patient, obtaining history from patient or surrogate, ordering and performing treatments and interventions, ordering and review of laboratory studies, ordering and review of radiographic studies, pulse oximetry, re-evaluation of patient's condition and review of old charts      Critical Care performed: yes ____________________________________________   INITIAL IMPRESSION / ASSESSMENT AND PLAN / ED COURSE  Pertinent labs & imaging results that were available during my care of the patient were reviewed by me and considered in my medical decision making (see chart for details).   DDX: Asthma, copd, CHF, pna, ptx, malignancy, Pe, anemia   Shelly Duran is a 78 y.o. who presents to the ED with acute respiratory failure as described above arrives to the ER.  Receiving steroids and nebulizer O2 sat is 100% better.  Will order additional nebulizers.  The patient will be placed on continuous pulse oximetry and telemetry for monitoring.  Laboratory evaluation will be sent to evaluate for the above complaints.     Clinical Course as of Oct 01 2218  Tue Oct 02, 2019  1951 Attempts made to contact family member to discuss CODE STATUS.  Left message with son to call back.  Patient is hypercapnic but mentation is improving she is receiving duo nebs right now and her O2 sats are staying in the mid to high 90s.  She is currently protecting her airway.  Given concern for Covid will hold off on BiPAP right now she is clinically  improving.   [PR]  2048 Overall patient seems to be improving.  She is disoriented but this may be her baseline.  Given white count respiratory distress will cover with antibiotics.  Presentation seems most consistent with COPD exacerbation aspiration is a possibility too.  She does not have any lateralizing deficits to suggest stroke.  Suspect lactic acidosis secondary to hypoxia. unresponsiveness could be explained by her hypercapnia.  We will continue to monitor.   [PR]  2201 Reassessed.  She continues to clinically be more alert and responsive.  Repeat VBG this point I do not feel that we need to escalate care as she is clinically improving.  Have a low suspicion for PE as she is clinically improving wheezing presentation is consistent with COPD exacerbation.  Discussed case with hospitalist.  Add on D-dimer.     [PR]    Clinical Course User Index [PR] Willy Eddy, MD    The patient was evaluated in Emergency Department today for the symptoms described in the history of present illness. He/she was evaluated in the context of the global COVID-19 pandemic, which necessitated consideration that the patient might be at risk for infection with the SARS-CoV-2 virus that causes COVID-19. Institutional protocols and algorithms that pertain to the evaluation of patients at risk for COVID-19 are in a state of rapid change based on information released by regulatory bodies including the  CDC and federal and [employer]. These policies and algorithms were followed during the patient's care in the ED.  As part of my medical decision making, I reviewed the following data within the electronic MEDICAL RECORD NUMBER Nursing notes reviewed and incorporated, Labs reviewed, notes from prior ED visits and [place] Controlled Substance Database   ____________________________________________   FINAL CLINICAL IMPRESSION(S) / ED DIAGNOSES  Final diagnoses:  Acute respiratory failure with hypoxia and hypercapnia  (HCC)  COPD exacerbation (HCC)      NEW MEDICATIONS STARTED DURING THIS VISIT:  New Prescriptions   No medications on file     Note:  This document was prepared using Dragon voice recognition software and may include unintentional dictation errors.    Willy Eddy, MD 10/02/19 2219

## 2019-10-02 NOTE — ED Notes (Signed)
1st set of blood cultures collected by Vilinda Blanks, EDT 2nd set of blood cultures collected by Daiva Nakayama, RN

## 2019-10-02 NOTE — ED Notes (Signed)
Date and time results received: 10/02/19 8:36 PM   Test: Lactic Critical Value: 3.4  Name of Provider Notified: Quentin Cornwall MD

## 2019-10-02 NOTE — ED Triage Notes (Signed)
attempted neb breathing treatment (approx 30 sec in), resp rate slowly dropping staff stated pt was up walking like normal and collapsed into couch 20g left AC normal 12 lead 125 solumedrol crackles full code 46 before bagging, 97 after [facility]

## 2019-10-03 ENCOUNTER — Inpatient Hospital Stay: Payer: Medicare Other

## 2019-10-03 DIAGNOSIS — Z66 Do not resuscitate: Secondary | ICD-10-CM

## 2019-10-03 DIAGNOSIS — J441 Chronic obstructive pulmonary disease with (acute) exacerbation: Secondary | ICD-10-CM

## 2019-10-03 LAB — BASIC METABOLIC PANEL
Anion gap: 12 (ref 5–15)
BUN: 16 mg/dL (ref 8–23)
CO2: 24 mmol/L (ref 22–32)
Calcium: 8.2 mg/dL — ABNORMAL LOW (ref 8.9–10.3)
Chloride: 102 mmol/L (ref 98–111)
Creatinine, Ser: 0.84 mg/dL (ref 0.44–1.00)
GFR calc Af Amer: >60 mL/min (ref >60)
GFR calc non Af Amer: >60 mL/min (ref >60)
Glucose, Bld: 149 mg/dL — ABNORMAL HIGH (ref 70–99)
Potassium: 4.1 mmol/L (ref 3.5–5.1)
Sodium: 138 mmol/L (ref 135–145)

## 2019-10-03 LAB — CBC
HCT: 38 % (ref 36.0–46.0)
Hemoglobin: 12.1 g/dL (ref 12.0–15.0)
MCH: 30.9 pg (ref 26.0–34.0)
MCHC: 31.8 g/dL (ref 30.0–36.0)
MCV: 97.2 fL (ref 80.0–100.0)
Platelets: 368 10*3/uL (ref 150–400)
RBC: 3.91 MIL/uL (ref 3.87–5.11)
RDW: 13.9 % (ref 11.5–15.5)
WBC: 20.6 10*3/uL — ABNORMAL HIGH (ref 4.0–10.5)
nRBC: 0 % (ref 0.0–0.2)

## 2019-10-03 LAB — TSH: TSH: 2.637 u[IU]/mL (ref 0.350–4.500)

## 2019-10-03 MED ORDER — IOHEXOL 350 MG/ML SOLN
75.0000 mL | Freq: Once | INTRAVENOUS | Status: AC | PRN
  Administered 2019-10-03: 75 mL via INTRAVENOUS

## 2019-10-03 MED ORDER — SODIUM CHLORIDE 0.9 % IV SOLN
2.0000 g | INTRAVENOUS | Status: DC
  Administered 2019-10-03 – 2019-10-06 (×4): 2 g via INTRAVENOUS
  Filled 2019-10-03 (×4): qty 2

## 2019-10-03 MED ORDER — METHYLPREDNISOLONE SODIUM SUCC 125 MG IJ SOLR
60.0000 mg | Freq: Three times a day (TID) | INTRAMUSCULAR | Status: DC
  Administered 2019-10-03 – 2019-10-05 (×7): 60 mg via INTRAVENOUS
  Filled 2019-10-03 (×7): qty 2

## 2019-10-03 MED ORDER — ENSURE ENLIVE PO LIQD
237.0000 mL | Freq: Two times a day (BID) | ORAL | Status: DC
  Administered 2019-10-04 – 2019-10-06 (×6): 237 mL via ORAL

## 2019-10-03 NOTE — TOC Progression Note (Signed)
Transition of Care [facility]) - Progression Note    Patient Details  Name: Shelly Duran MRN: [id number] Date of Birth: 1941-10-24  [facility]) CM/SW [place], RN Phone Number: 10/03/2019, 2:51 PM  Clinical Narrative:     Son called and stated that the patient lives at [facility] living and that she has went to short term rehab about a year ago. He is open to go to rehab again, I explained that the facilities do not offer visitation and that the patient's are all quarantined for 14 days, he stated understanding       Expected Discharge Plan and Services                                                 Social Determinants of Health (SDOH) Interventions    Readmission Risk Interventions No flowsheet data found.

## 2019-10-03 NOTE — Progress Notes (Signed)
Pt arrived on unit from ED. Pt alert and oriented to person only. O2 100% via 2LNC. Skin is clear. Initial assessment done. Pdowless,rn

## 2019-10-03 NOTE — Progress Notes (Signed)
Pt pulling on IV's, confused, pulling off tele wires. Pt yells out "help". Reoriented frequently. pdowless,rn

## 2019-10-03 NOTE — H&P (Signed)
[facility] NAME: Shelly Duran    MR#:  [id number]  DATE OF BIRTH:  Aug 07, 1941  DATE OF ADMISSION:  10/02/2019  PRIMARY CARE PHYSICIAN: Patient, No Pcp Per   REQUESTING/REFERRING PHYSICIAN: Merlyn Lot, MD  CHIEF COMPLAINT:   Chief Complaint  Patient presents with   Respiratory Distress  The patient was seen seen and examined on 10/02/2019  HISTORY OF PRESENT ILLNESS:  Shelly Duran  is a 78 y.o. female with a known history of Alzheimer's dementia, who presented to the emergency room from [facility] with acute onset of respiratory distress with hypoxemia with a pulse oximetry dropped down to the 40s.  The patient was bagged, was given nebulized bronchodilator therapy as well as IV Solu-Medrol for suspected COPD exacerbation and was brought to the emergency room.  There was no reported fever or chills.  No reported chest pain or palpitations.  No reported nausea or vomiting or abdominal pain.  Upon presentation to the emergency room, blood pressure was 147/93 with a temperature of 97.5 and pulse oximetry was 96% on 2 L O2 by nasal cannula with respiratory  rate of 25.  Labs revealed a VBG with pH 7.23 and PCO2 of 73 bicarbonate of 30.6.  Lactic acid was 3.2.  CMP was remarkable for blood glucose of 326 with anion gap of 12 and CO2 24, AST 44.  BNP was 98 and high-sensitivity troponin I was 17 and later 42.  CBC showed significant cytosis of 21.6 and platelets were 401 with neutrophilia.  500 mL D-dimer came back elevated at 943.  Urinalysis came back negative except for more than 500 glucose.  Blood cultures were sent as well as urine culture.  COVID-19 test is negative.  CT scan without contrast revealed atrophy with mild chronic small vessel white matter ischemic changes and remote left caudate infarct with no acute intracranial normalities.  Portable chest ray showed left basal opacity that may represent atelectasis or  infiltrate with a small left pleural effusion, emphysema and cardiomegaly  The patient was given nebulized albuterol, duo nebs twice, IV cefepime and Zithromax and 1 L bolus of IV normal saline PAST MEDICAL HISTORY:   Past Medical History:  Diagnosis Date   Alzheimer's dementia ([place])    Dementia ([place])   Tobacco abuse. Hyper thyroidism Vitamin B12 deficiency Behavioral changes with dementia. PAST SURGICAL HISTORY:  Unobtainable due to the patient's advanced dementia.   SOCIAL HISTORY:   Social History   Tobacco Use   Smoking status: Current Every Day Smoker   Smokeless tobacco: Never Used  Substance Use Topics   Alcohol use: Never    Frequency: Never    FAMILY HISTORY:   Family History  Family history unknown: Yes  Unobtainable due to the patient's advanced dementia  DRUG ALLERGIES:  No Known Allergies  REVIEW OF SYSTEMS:   ROS As per history of present illness. All pertinent systems were reviewed above. Constitutional,  HEENT, cardiovascular, respiratory, GI, GU, musculoskeletal, neuro, psychiatric, endocrine,  integumentary and hematologic systems were reviewed and are otherwise  negative/unremarkable except for positive findings mentioned above in the HPI.   MEDICATIONS AT HOME:   Prior to Admission medications   Medication Sig Start Date End Date Taking? Authorizing Provider  acetaminophen (TYLENOL) 325 MG tablet Take 650 mg by mouth every 4 (four) hours as needed (for up to 24 hours).   Yes [provider]  alum & mag hydroxide-simeth (MYLANTA) 200-200-20 MG/5ML  suspension Take 30 mLs by mouth once as needed for indigestion or heartburn. Take 30 ml by mouth as needed for 1 dose only   Yes [provider]  bismuth subsalicylate (PEPTO BISMOL) 262 MG/15ML suspension Take 15 mLs by mouth every 2 (two) hours as needed (for up to 6 doses in 24 hours).   Yes [provider]  cholecalciferol (VITAMIN D3) 25 MCG (1000 UT) tablet Take  1,000 Units by mouth daily.   Yes [provider]  ipratropium-albuterol (DUONEB) 0.5-2.5 (3) MG/3ML SOLN Take 3 mLs by nebulization every 6 (six) hours as needed. 09/14/18  Yes Enid Baas, MD  loperamide (IMODIUM) 2 MG capsule Take 2 mg by mouth as needed for diarrhea or loose stools. Take one tablet by mouth after each loose stool for up to 4 doses in 12 hours.   Yes [provider]  magnesium hydroxide (MILK OF MAGNESIA) 400 MG/5ML suspension Take 30 mLs by mouth once as needed for mild constipation. 6 ounces of Prune juice with Milk of Magnesium   Yes [provider]  methimazole (TAPAZOLE) 5 MG tablet Take 1 tablet (5 mg total) by mouth daily. 09/14/18  Yes Enid Baas, MD  Multiple Vitamin (MULTIVITAMIN WITH MINERALS) TABS tablet Take 1 tablet by mouth daily. 11/02/18  Yes Katha Hamming, MD  Phenylephrine-DM-GG (ROBITUSSIN TO GO COUGH/COLD CF) 5-10-100 MG/5ML LIQD Take 10 mLs by mouth every 6 (six) hours as needed (for up to 48 hours).   Yes [provider]  risperiDONE (RISPERDAL) 1 MG tablet Take 1 tablet by mouth 2 (two) times daily. May repeat 1 hour after bedtime dose if needed for agitation. 08/18/18  Yes [provider]  sodium phosphate (FLEET) 7-19 GM/118ML ENEM Place 1 enema rectally as needed for severe constipation. Use once for constipation if not relieved by Milk of Mag and Prune juice.   Yes [provider]  traZODone (DESYREL) 50 MG tablet Take 1 tablet by mouth at bedtime as needed. 09/28/19  Yes [provider]  cyanocobalamin (,VITAMIN B-12,) 1000 MCG/ML injection Inject 1,000 mcg into the muscle every 30 (thirty) days.    [provider]      VITAL SIGNS:  Blood pressure 102/71, pulse 70, temperature (!) 97.5 F (36.4 C), temperature source Rectal, resp. rate 18, height 5\' 2"  (1.575 m), weight 45.4 kg, SpO2 96 %.  PHYSICAL EXAMINATION:  Physical Exam  GENERAL:  78 y.o.-year-old  patient lying in the bed with no acute distress.  EYES: Pupils equal, round, reactive to light and accommodation. No scleral icterus. Extraocular muscles intact.  HEENT: Head atraumatic, normocephalic. Oropharynx and nasopharynx clear.  NECK:  Supple, no jugular venous distention. No thyroid enlargement, no tenderness.  LUNGS: Diminished bibasilar breath sounds with occasional expiratory wheezes and diminished expiratory airflow. CARDIOVASCULAR: Regular rate and rhythm, S1, S2 normal. No murmurs, rubs, or gallops.  ABDOMEN: Soft, nondistended, nontender. Bowel sounds present. No organomegaly or mass.  EXTREMITIES: No pedal edema, cyanosis, or clubbing.  NEUROLOGIC: Cranial nerves II through XII are intact. Muscle strength 5/5 in all extremities. Sensation intact. Gait not checked.  PSYCHIATRIC: The patient is alert and oriented x 3.  Normal affect and good eye contact. SKIN: No obvious rash, lesion, or ulcer.   LABORATORY PANEL:   CBC Recent Labs  Lab 10/02/19 1935  WBC 21.6*  HGB 13.6  HCT 42.2  PLT 401*   ------------------------------------------------------------------------------------------------------------------  Chemistries  Recent Labs  Lab 10/02/19 1935  NA 135  K 4.5  CL 99  CO2 24  GLUCOSE 326*  BUN 19  CREATININE 1.01*  CALCIUM 8.1*  AST 44*  ALT 27  ALKPHOS 130*  BILITOT 1.1   ------------------------------------------------------------------------------------------------------------------  Cardiac Enzymes No results for input(s): TROPONINI in the last 168 hours. ------------------------------------------------------------------------------------------------------------------  RADIOLOGY:  Ct Head Wo Contrast  Result Date: 10/02/2019 CLINICAL DATA:  78 year old female with altered mental status and altered level of consciousness. EXAM: CT HEAD WITHOUT CONTRAST TECHNIQUE: Contiguous axial images were obtained from the base of the skull through the  vertex without intravenous contrast. COMPARISON:  None. FINDINGS: Brain: No evidence of acute infarction, hemorrhage, hydrocephalus, extra-axial collection or mass lesion/mass effect. Atrophy, mild chronic small-vessel white matter ischemic changes remote LEFT caudate lacunar infarct noted. Vascular: No hyperdense vessel or unexpected calcification. Skull: Normal. Negative for fracture or focal lesion. Sinuses/Orbits: No acute finding. Other: None. IMPRESSION: 1. No evidence of acute intracranial abnormality. 2. Atrophy, mild chronic small-vessel white matter ischemic changes and remote LEFT caudate infarct. Electronically Signed   By: Harmon PierJeffrey  Hu M.D.   On: 10/02/2019 20:19   Dg Chest Port 1 View  Result Date: 10/02/2019 CLINICAL DATA:  Respiratory distress. EXAM: PORTABLE CHEST 1 VIEW COMPARISON:  October 28, 2018 FINDINGS: The heart size remains enlarged. Emphysematous changes are noted bilaterally. The lungs are hyperexpanded. There is an opacity at the left lung base which may represent atelectasis or infiltrate in combination with a small left-sided pleural effusion. There is no pneumothorax. IMPRESSION: 1. Stable chest x-ray. 2. Left basilar opacity may represent atelectasis or infiltrate in combination with a small left pleural effusion. 3. Emphysema. 4. Cardiomegaly. Electronically Signed   By: Katherine Mantlehristopher  Green M.D.   On: 10/02/2019 19:47      IMPRESSION AND PLAN:  1.  Severe COPD exacerbation possibly secondary to left basal pneumonia likely with parapneumonic effusion.  Patient will be admitted to medical monitored bed.  She will be placed on continued antibiotic therapy with IV Rocephin and Zithromax and continue steroid therapy with IV Solu-Medrol as well as duo nebs on a scheduled and as needed basis and mucolytic's.  Will follow sputum and blood cultures.  2.  Acute hypoxic respiratory failure.  Secondary to #1.  O2 protocol will be followed.  3.  Elevated D-dimer.  Will obtain a chest  CTA to rule out PE.  4.  Hyperthyroidism.  We will continue Tapazole and check TSH.  5.  Dementia with behavioral changes.  We will continue Risperdal  6.  DVT prophylaxis.  Subtenons Lovenox  All the records are reviewed and case discussed with ED provider. The plan of care was discussed in details with the patient (and family). I answered all questions. The patient agreed to proceed with the above mentioned plan. Further management will depend upon hospital course.   CODE STATUS: The patient is DNR/DNI.  TOTAL TIME TAKING CARE OF THIS PATIENT: 50 minutes.    Hannah BeatJan A Glorianne Proctor M.D on 10/03/2019 at 12:28 AM  [facility]   From 7 PM-7 AM, contact night-coverage [email or web address]  CC: Primary care physician; Patient, No Pcp Per   Note: This dictation was prepared with Dragon dictation along with smaller phrase technology. Any transcriptional errors that result from this process are unintentional.

## 2019-10-03 NOTE — Progress Notes (Signed)
Tele monitory obtained now and continuous O2 monitoring. Pt alert and responds slow but appropriate. pdowless,rn

## 2019-10-03 NOTE — Progress Notes (Signed)
Initial Nutrition Assessment  DOCUMENTATION CODES:   Severe malnutrition in context of chronic illness  INTERVENTION:  Provide Ensure Enlive po BID, each supplement provides 350 kcal and 20 grams of protein. Patient prefers chocolate.  Provide Magic cup TID with meals, each supplement provides 290 kcal and 9 grams of protein.  NUTRITION DIAGNOSIS:   Severe Malnutrition related to chronic illness(COPD, dementia) as evidenced by severe fat depletion, severe muscle depletion.  GOAL:   Patient will meet greater than or equal to 90% of their needs  MONITOR:   PO intake, Supplement acceptance, Labs, Weight trends, I & O's  REASON FOR ASSESSMENT:   Consult Assessment of nutrition requirement/status  ASSESSMENT:   78 year old female with Alzheimer's dementia admitted with severe COPD exacerbation.   Met with patient in room. She was sitting on chair. She was confused and unable to provide any history. She is from [facility] care. According to chart she is on a regular texture diet with thin liquids. She receives chocolate Boost TID (?original vs plus). Noted a diagnosis of malnutrition in the chart.  Patient's weights in chart fluctuate. She was 40.7 kg on 09/10/2018 and 33.9 kg on 11/01/2018. Current weight is 49.8 kg (109.8 lbs).  Medications reviewed and include: vitamin D3 1000 units daily, vitamin B12 1000 micrograms every 30 days, Solu-Medrol 60 mg Q8hrs IV, MVI daily, NS at 100 mL/hr, azithromycin, ceftriaxone.  Labs reviewed.  NUTRITION - FOCUSED PHYSICAL EXAM:    Most Recent Value  Orbital Region  Severe depletion  Upper Arm Region  Severe depletion  Thoracic and Lumbar Region  Moderate depletion  Buccal Region  Moderate depletion  Temple Region  Moderate depletion  Clavicle Bone Region  Severe depletion  Clavicle and Acromion Bone Region  Severe depletion  Scapular Bone Region  Severe depletion  Dorsal Hand  Severe depletion  Patellar Region  Severe  depletion  Anterior Thigh Region  Severe depletion  Posterior Calf Region  Severe depletion  Edema (RD Assessment)  None  Hair  Reviewed  Eyes  Reviewed  Mouth  Reviewed  Skin  Reviewed  Nails  Reviewed     Diet Order:   Diet Order            Diet Carb Modified Fluid consistency: Thin; Room service appropriate? Yes  Diet effective now             EDUCATION NEEDS:   No education needs have been identified at this time  Skin:  Skin Assessment: Reviewed RN Assessment  Last BM:  10/02/2019 per chart  Height:   Ht Readings from Last 1 Encounters:  10/02/19 _0  (1.575 m)   Weight:   Wt Readings from Last 1 Encounters:  10/03/19 49.8 kg   Ideal Body Weight:  50 kg  BMI:  Body mass index is 20.08 kg/m.  Estimated Nutritional Needs:   Kcal:  1300-1500  Protein:  65-75 grams  Fluid:  1.3-1.5 L/day  Jacklynn Barnacle, MS, RD, LDN Office: [phone] Pager: [phone] After Hours/Weekend Pager: [phone]

## 2019-10-03 NOTE — TOC Progression Note (Signed)
Transition of Care [facility]) - Progression Note    Patient Details  Name: Shelly Duran MRN: [id number] Date of Birth: September 14, 1941  Transition of Care [facility]) CM/SW [place], RN Phone Number: 10/03/2019, 12:35 PM  Clinical Narrative:    Attempted to reach the son Shanon Brow and I left a general VM for a call back and provided my contact information        Expected Discharge Plan and Services                                                 Social Determinants of Health (SDOH) Interventions    Readmission Risk Interventions No flowsheet data found.

## 2019-10-03 NOTE — Evaluation (Signed)
Physical Therapy Evaluation Patient Details Name: Shelly Duran MRN: [id number] DOB: 1941-08-20 Today's Date: 10/03/2019   History of Present Illness  78 yo female admitted after presenting to ED from SNF with acute onset respiratory distress, pulse ox dropped to 40s, portable chest xray showed left basal opacity that may represent atelectasis or infiltrate with a small left pleural effusion, emphysema, cardiomegaly  Clinical Impression  Pt 78 yo female admitted for above. Pt agreeable to PT. Pt presents with decreased cognition with hx of Alzheimers dementia. Per chart review, pt admitted from SNF. Pt reporting she lives alone in a one level house and independent with ADLs and ambulation. Pt oriented to self only. Pt responds best to yes/no questions and commands. Follows one step commands with increased time. Pt required min guard for bed mobility and transfers and min A for ambulation to recliner. Min A during ambulation for RW mgt also requiring mod cuing for safety with turns and sitting in recliner. Pt on 2L O2 nasal cannula t/o entire session. Unable to get accurate pulse ox reading after transfers but pt denied being SOB and no outward signs of SOB.  Pt presents with decreased cognition, strength, balance and endurance limiting functional mobility. Pt is currently at increased risk of falls. Pt would benefit from skilled acute PT to improve deficits and recommendation for STR following hospital discharge to decrease fall risk, increase independence and decrease caregiver burden.      Follow Up Recommendations SNF;Supervision/Assistance - 24 hour    Equipment Recommendations  Rolling walker with 5" wheels    Recommendations for Other Services       Precautions / Restrictions Precautions Precautions: Fall Restrictions Weight Bearing Restrictions: No      Mobility  Bed Mobility Overal bed mobility: Needs Assistance Bed Mobility: Supine to Sit     Supine to sit: Min guard;HOB  elevated     General bed mobility comments: min guard for safety, pt able to get EOB without physical assist, increased time and effort and use of bed rails  Transfers Overall transfer level: Needs assistance Equipment used: Rolling walker (2 wheeled) Transfers: Sit to/from [facility] Sit to Stand: Min guard Stand pivot transfers: Min guard       General transfer comment: min guard for STS and stand pivot to BSC, min guard for safety, pt requires increased time but no physical assist to rise, pt requires frequent cuing to turn all the way before trying to sit in recliner or BSC, pt with very little safety awareness  Ambulation/Gait Ambulation/Gait assistance: Min assist Gait Distance (Feet): 3 Feet Assistive device: Rolling walker (2 wheeled) Gait Pattern/deviations: Shuffle;Decreased dorsiflexion - right;Decreased dorsiflexion - left;Decreased stride length;Trunk flexed Gait velocity: decreased   General Gait Details: pt ambulated a few feet from [facility] to recliner with RW, close min guard for safety, pt ambulates with extremely short shuffle steps, decreased cadence and foot clearance, pt with increased reliance on UE suport from RW and increased trunk flexion, min A for RW mgt especially with turn, cuing for RW mgt and turning completely around and reaching back for handrest before sitting in Physicist, medical    Modified Rankin (Stroke Patients Only)       Balance Overall balance assessment: Needs assistance Sitting-balance support: Bilateral upper extremity supported;Feet supported Sitting balance-Leahy Scale: Fair Sitting balance - Comments: steady sitting EOB and in recliner performing LE therex     Standing  balance-Leahy Scale: Poor Standing balance comment: reliant on UE support from RW                             Pertinent Vitals/Pain Pain Assessment: No/denies pain    Home Living Family/patient  expects to be discharged to:: Skilled Nursing Facility                 Additional Comments: Per chart review, pt from SNF    Prior Function           Comments: No family or caregiver in room to provide information, pt is a poor historian, pt reporting living alone in her one level home requiring no assistance for ADLs and walking independently with no AD     Hand Dominance        Extremity/Trunk Assessment   Upper Extremity Assessment Upper Extremity Assessment: Generalized weakness    Lower Extremity Assessment Lower Extremity Assessment: Generalized weakness    Cervical / Trunk Assessment Cervical / Trunk Assessment: Kyphotic  Communication   Communication: No difficulties  Cognition Arousal/Alertness: Awake/alert Behavior During Therapy: WFL for tasks assessed/performed Overall Cognitive Status: History of cognitive impairments - at baseline                                        General Comments      Exercises Total Joint Exercises Ankle Circles/Pumps: AROM;Both;10 reps Straight Leg Raises: AROM;Both;5 reps Long Arc Quad: AROM;Both;10 reps   Assessment/Plan    PT Assessment Patient needs continued PT services  PT Problem List Decreased strength;Decreased mobility;Decreased safety awareness;Decreased range of motion;Decreased activity tolerance;Decreased balance;Decreased knowledge of use of DME;Cardiopulmonary status limiting activity;Decreased cognition       PT Treatment Interventions DME instruction;Therapeutic exercise;Gait training;Balance training;Stair training;Functional mobility training;Therapeutic activities;Patient/family education;Cognitive remediation    PT Goals (Current goals can be found in the Care Plan section)  Acute Rehab PT Goals PT Goal Formulation: Patient unable to participate in goal setting    Frequency Min 2X/week   Barriers to discharge        Co-evaluation               AM-PAC PT "6  Clicks" Mobility  Outcome Measure Help needed turning from your back to your side while in a flat bed without using bedrails?: A Little Help needed moving from lying on your back to sitting on the side of a flat bed without using bedrails?: A Little Help needed moving to and from a bed to a chair (including a wheelchair)?: A Little Help needed standing up from a chair using your arms (e.g., wheelchair or bedside chair)?: A Little Help needed to walk in hospital room?: A Little Help needed climbing 3-5 steps with a railing? : A Lot 6 Click Score: 17    End of Session Equipment Utilized During Treatment: Gait belt Activity Tolerance: Patient tolerated treatment well Patient left: in chair;with call bell/phone within reach;with chair alarm set Nurse Communication: Mobility status PT Visit Diagnosis: Muscle weakness (generalized) (M62.81);Difficulty in walking, not elsewhere classified (R26.2);History of falling (Z91.81)    Time: [id number] PT Time Calculation (min) (ACUTE ONLY): 29 min   Charges:   PT Evaluation $PT Eval Moderate Complexity: 1 Mod PT Treatments $Therapeutic Exercise: 8-22 mins        Karoline CaldwellAmy Kiel Cockerell PT, DPT 12:07 PM,10/03/19   Starnisha Batrez MJudie Petit  Emanuella Nickle 10/03/2019, 12:04 PM

## 2019-10-03 NOTE — Progress Notes (Signed)
Unable to complete screening due to [place]. pdowless,rn

## 2019-10-03 NOTE — NC FL2 (Signed)
[place] LEVEL OF CARE SCREENING TOOL     IDENTIFICATION  Patient Name: Shelly Duran Birthdate: 1941/04/30 Sex: female Admission Date (Current Location): 10/02/2019  [place] and [place] Number:  Engineering geologist and Address:  [facility], [address], [place], [place] [address]      Provider Number: [id number]  Attending Physician Name and Address:  Nolberto Hanlon, MD  Relative Name and Phone Number:  Shanon Brow [phone]    Current Level of Care: Hospital Recommended Level of Care: [place] Prior Approval Number:    Date Approved/Denied:   PASRR Number: [id number]  Discharge Plan: SNF    Current Diagnoses: Patient Active Problem List   Diagnosis Date Noted  . DNR (do not resuscitate) 10/03/2019  . Sepsis due to pneumonia ([place]) 10/28/2018  . Protein-calorie malnutrition, severe 09/12/2018  . COPD exacerbation ([place])   . Acute respiratory failure ([place])   . Multifocal pneumonia   . Palliative care encounter   . Sepsis ([place]) 09/10/2018  . 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 ELEVATED BP READING WITHOUT DX HYPERTENSION 03/10/2010    Orientation RESPIRATION BLADDER Height & Weight     Self, Time, Situation, Place    External catheter Weight: 49.8 kg Height:  5\' 2"  (157.5 cm)  BEHAVIORAL SYMPTOMS/MOOD NEUROLOGICAL BOWEL NUTRITION STATUS      Continent    AMBULATORY STATUS COMMUNICATION OF NEEDS Skin   Extensive Assist Verbally                         Personal Care Assistance Level of Assistance  Bathing, Feeding, Dressing Bathing Assistance: Limited assistance Feeding assistance: Limited assistance Dressing Assistance: Limited assistance     Functional Limitations Info             SPECIAL CARE FACTORS FREQUENCY  PT (By licensed PT), OT (By licensed OT)     PT Frequency: 5 days a week OT Frequency: 5 days a week            Contractures Contractures Info: Not present     Additional Factors Info  Code Status Code Status Info: DNR             Current Medications (10/03/2019):  This is the current hospital active medication list Current Facility-Administered Medications  Medication Dose Route Frequency Provider Last Rate Last Dose  . 0.9 %  sodium chloride infusion   Intravenous Continuous Mansy, Jan A, MD 100 mL/hr at 10/03/19 1403    . acetaminophen (TYLENOL) tablet 650 mg  650 mg Oral Q6H PRN Mansy, Jan A, MD       Or  . acetaminophen (TYLENOL) suppository 650 mg  650 mg Rectal Q6H PRN Mansy, Jan A, MD      . alum & mag hydroxide-simeth (MAALOX/MYLANTA) 200-200-20 MG/5ML suspension 30 mL  30 mL Oral Once PRN Mansy, Jan A, MD      . azithromycin (ZITHROMAX) 500 mg in sodium chloride 0.9 % 250 mL IVPB  500 mg Intravenous Q24H Mansy, Jan A, MD      . bismuth subsalicylate (PEPTO BISMOL) 262 MG/15ML suspension 15 mL  15 mL Oral Q2H PRN Mansy, Jan A, MD      . cefTRIAXone (ROCEPHIN) 2 g in sodium chloride 0.9 % 100 mL IVPB  2 g Intravenous Q24H Mansy, Jan A, MD 200 mL/hr at 10/03/19 0949 2 g at 10/03/19 0949  . chlorpheniramine-HYDROcodone (TUSSIONEX) 10-8 MG/5ML suspension 5 mL  5 mL  Oral Q12H PRN Mansy, Jan A, MD      . cholecalciferol (VITAMIN D3) tablet 1,000 Units  1,000 Units Oral Daily Mansy, Vernetta Honey, MD   1,000 Units at 10/03/19 0943  . [START ON 10/05/2019] cyanocobalamin ((VITAMIN B-12)) injection 1,000 mcg  1,000 mcg Intramuscular Q30 days Mansy, Jan A, MD      . enoxaparin (LOVENOX) injection 40 mg  40 mg Subcutaneous Q24H Mansy, Jan A, MD   40 mg at 10/02/19 2322  . guaiFENesin (MUCINEX) 12 hr tablet 600 mg  600 mg Oral BID Mansy, Jan A, MD   600 mg at 10/03/19 1355  . ipratropium-albuterol (DUONEB) 0.5-2.5 (3) MG/3ML nebulizer solution 3 mL  3 mL Nebulization Q6H PRN Mansy, Jan A, MD      . ipratropium-albuterol (DUONEB) 0.5-2.5 (3) MG/3ML nebulizer solution 3 mL  3 mL Nebulization QID Mansy, Jan A, MD   3 mL at 10/03/19 1500  . magnesium  hydroxide (MILK OF MAGNESIA) suspension 30 mL  30 mL Oral Once PRN Mansy, Jan A, MD      . magnesium hydroxide (MILK OF MAGNESIA) suspension 30 mL  30 mL Oral Daily PRN Mansy, Jan A, MD      . methimazole (TAPAZOLE) tablet 5 mg  5 mg Oral Daily Mansy, Jan A, MD   5 mg at 10/03/19 1354  . methylPREDNISolone sodium succinate (SOLU-MEDROL) 125 mg/2 mL injection 60 mg  60 mg Intravenous Q8H Mansy, Jan A, MD   60 mg at 10/03/19 1403  . multivitamin with minerals tablet 1 tablet  1 tablet Oral Daily Mansy, Jan A, MD   1 tablet at 10/03/19 0943  . ondansetron (ZOFRAN) tablet 4 mg  4 mg Oral Q6H PRN Mansy, Jan A, MD       Or  . ondansetron [facility]) injection 4 mg  4 mg Intravenous Q6H PRN Mansy, Jan A, MD      . risperiDONE (RISPERDAL) tablet 1 mg  1 mg Oral BID Mansy, Jan A, MD   1 mg at 10/02/19 2317  . sodium phosphate (FLEET) 7-19 GM/118ML enema 1 enema  1 enema Rectal PRN Mansy, Jan A, MD      . traZODone (DESYREL) tablet 50 mg  50 mg Oral QHS PRN Mansy, Vernetta Honey, MD         Discharge Medications: Please see discharge summary for a list of discharge medications.  Relevant Imaging Results:  Relevant Lab Results:   Additional Information #[id number]  Barrie Dunker, RN

## 2019-10-03 NOTE — Evaluation (Signed)
Occupational Therapy Evaluation Patient Details Name: Shelly Duran MRN: [id number] DOB: 09-27-1941 Today's Date: 10/03/2019    History of Present Illness 78 yo female admitted after presenting to ED from SNF with acute onset respiratory distress, pulse ox dropped to 40s, portable chest xray showed left basal opacity that may represent atelectasis or infiltrate with a small left pleural effusion, emphysema, cardiomegaly   Clinical Impression   Ms. Word was seen for OT evaluation this date. Pt received semi-reclined in room chair with RN in room to assess. Per RN, pt had just pulled out her IV. RN cleared pt for OT evaluation. Pt unable to provide information regarding PLOF and home set-up 2/2 hx of cognitive impairment at baseline. No family/caregiver available to provide background information. Pt presents with deficits in strength, activity tolerance, cognition, and safety awareness which functionally limit her abilty to perform self-care tasks. Pt requires contact guard to moderate assist for BADL tasks including min assist for self-feeding and contact guard functional mobility with consistent cueing for initiation of tasks, safety, and sequencing required. Pt would benefit from skilled OT to address noted impairments and functional limitations (see below for any additional details) in order to maximize safety and independence while minimizing falls risk and caregiver burden. Recommend STR upon hospital discharge.     Follow Up Recommendations  SNF;Supervision/Assistance - 24 hour    Equipment Recommendations  Other (comment)(TBD at next venue of care.)    Recommendations for Other Services       Precautions / Restrictions Precautions Precautions: Fall Restrictions Weight Bearing Restrictions: No      Mobility Bed Mobility Overal bed mobility: Needs Assistance Bed Mobility: Supine to Sit     Supine to sit: Min guard;HOB elevated     General bed mobility comments: Deferred. Pt  up in recliner at start/end of sesison. Per physical therapy, pt required min guard for sup>sit.  Transfers Overall transfer level: Needs assistance Equipment used: Rolling walker (2 wheeled) Transfers: Sit to/from [facility] Sit to Stand: Min guard Stand pivot transfers: Min guard       General transfer comment: Min guard for safety. Requires frequent cueing for sequencing and safety.    Balance Overall balance assessment: Needs assistance Sitting-balance support: Bilateral upper extremity supported;Feet supported Sitting balance-Leahy Scale: Fair Sitting balance - Comments: Pt recieved with BLE out of recliner. Is able to adjust self and bring feet back up into recliner w/o LOB noted.     Standing balance-Leahy Scale: Poor Standing balance comment: reliant on UE support from RW                           ADL either performed or assessed with clinical judgement   ADL Overall ADL's : Needs assistance/impaired                                       General ADL Comments: Pt requires min A to set-up assist for self feeding this date. Has difficulty with task initiation, but once prompted is able to feed herself appropriately. Pt requires supervision to CGA for safety during functional mobility. Min-moderate assist for LB dressing and bathing.     Vision Baseline Vision/History: Wears glasses Wears Glasses: Reading only Patient Visual Report: No change from baseline Additional Comments: Will continue to monitor within a functional context.     Perception  Praxis      Pertinent Vitals/Pain Pain Assessment: No/denies pain Pain Intervention(s): Monitored during session     Hand Dominance Right   Extremity/Trunk Assessment Upper Extremity Assessment Upper Extremity Assessment: Generalized weakness   Lower Extremity Assessment Lower Extremity Assessment: Generalized weakness   Cervical / Trunk Assessment Cervical / Trunk  Assessment: Kyphotic   Communication Communication Communication: Expressive difficulties(Tends to stop speaking half-way through a sentence or thought. At times difficult to understand.)   Cognition Arousal/Alertness: Awake/alert Behavior During Therapy: WFL for tasks assessed/performed Overall Cognitive Status: History of cognitive impairments - at baseline                                 General Comments: Pt unable to answer A&O questions other than date of birth. Gave maiden name. Was observed to begin statements/questions but speech would trail off or become unintelligible. Follows 1-step commands inconsistently. Does best with concrete yes/no questions and physical/object prompting for functional tasks.   General Comments       Exercises Total Joint Exercises Ankle Circles/Pumps: AROM;Both;10 reps Straight Leg Raises: AROM;Both;5 reps Long Arc Quad: AROM;Both;10 reps Other Exercises Other Exercises: OT engages pt in self-feeding tasks, provides set-up of lunch tray items and provides min assist for initiating self-feeding tasks this date. RN educated on strategies for assisting pt with meal trays including limiting visual distraction on tray to support pt engagement with self-feeding.   Shoulder Instructions      Home Living Family/patient expects to be discharged to:: Assisted living                                 Additional Comments: Per chart review, pt from ALF      Prior Functioning/Environment          Comments: No family or caregiver in room to provide information, pt is a poor historian, unable to answer A&O questions. When asked about ALF pt states "I don't think that's right" but does not elaborate on living situation.        OT Problem List: Decreased strength;Decreased coordination;Decreased range of motion;Decreased cognition;Decreased activity tolerance;Decreased safety awareness;Decreased knowledge of use of DME or AE;Impaired  balance (sitting and/or standing)      OT Treatment/Interventions: Self-care/ADL training;Therapeutic exercise;Therapeutic activities;DME and/or AE instruction;Patient/family education;Balance training    OT Goals(Current goals can be found in the care plan section) Acute Rehab OT Goals Patient Stated Goal: Pt unable to state OT Goal Formulation: Patient unable to participate in goal setting Time For Goal Achievement: 10/17/19 Potential to Achieve Goals: Fair ADL Goals Pt Will Perform Eating: with set-up;with supervision;sitting Pt Will Perform Grooming: with set-up;with supervision;sitting Pt Will Transfer to Toilet: ambulating;bedside commode;with min guard assist;with min assist(With LRAD PRN for improved safety and functional independence.) Pt Will Perform Toileting - Clothing Manipulation and hygiene: sit to/from stand;with min assist(With LRAD PRN for improved safety and functional independence.)  OT Frequency: Min 2X/week   Barriers to D/C: Decreased caregiver support          Co-evaluation              AM-PAC OT "6 Clicks" Daily Activity     Outcome Measure Help from another person eating meals?: A Little Help from another person taking care of personal grooming?: A Little Help from another person toileting, which includes using toliet, bedpan, or urinal?: A Little  Help from another person bathing (including washing, rinsing, drying)?: A Lot Help from another person to put on and taking off regular upper body clothing?: A Little Help from another person to put on and taking off regular lower body clothing?: A Lot 6 Click Score: 16   End of Session Equipment Utilized During Treatment: Gait belt;Rolling walker  Activity Tolerance: Patient tolerated treatment well Patient left: in chair;with chair alarm set;with nursing/sitter in room  OT Visit Diagnosis: Other abnormalities of gait and mobility (R26.89);Muscle weakness (generalized) (M62.81)                Time:  [id number] OT Time Calculation (min): 23 min Charges:  OT General Charges $OT Visit: 1 Visit OT Evaluation $OT Eval Moderate Complexity: 1 Mod OT Treatments $Self Care/Home Management : 8-22 mins  Rockney GheeSerenity Alijah Hyde, M.S., OTR/L Ascom: [phone] 10/03/19, 3:55 PM

## 2019-10-03 NOTE — Progress Notes (Signed)
Pt to CT

## 2019-10-03 NOTE — TOC Progression Note (Addendum)
Transition of Care [facility]) - Progression Note    Patient Details  Name: Shelly Duran MRN: [id number] Date of Birth: Sep 12, 1941  [facility] CM/SW Contact  Su Hilt, RN Phone Number: 10/03/2019, 3:05 PM  Clinical Narrative:    Talked with the son Shelly Duran and he provided the phone number for [facility] I called Tammy at [phone], The patient is in the assisted living Memory care unit.  [facility] has a working relationship with [facility] and they prefer the patient to go there.  I let Tammy Know that I would seek a bed offer from [facility] and let her know.  Bed search sent to SNF in [place], will review beds with Tammy at [facility] and Shelly Duran the son once received       Expected Discharge Plan and Services                                                 Social Determinants of Health (SDOH) Interventions    Readmission Risk Interventions No flowsheet data found.

## 2019-10-03 NOTE — Progress Notes (Signed)
Patient ID: Zelene Barga, female   DOB: August 22, 1941, 78 y.o.   MRN: [id number]  Abbrielle Batts  is a 78 y.o. female with a known history of Alzheimer's dementia, who presented to the emergency room from [facility] skilled nursing facility with acute onset of respiratory distress with hypoxemia with a pulse oximetry dropped down to the 40s.  The patient was bagged, was given nebulized bronchodilator therapy as well as IV Solu-Medrol for suspected COPD exacerbation and was brought to the emergency room.  There was no reported fever or chills.  Patient has been started on IV steroids, azithromycin and cefepime, and ceftriaxone. Chart and history reviewed Please note patient was admitted after midnight.

## 2019-10-03 NOTE — ED Notes (Signed)
Spoke with niece Joseph Art (number in demographics, lives in [place]) to give update.  She will call Ms. Lyon's sons with update.  Both sons live in [place].

## 2019-10-04 DIAGNOSIS — E039 Hypothyroidism, unspecified: Secondary | ICD-10-CM

## 2019-10-04 DIAGNOSIS — F0391 Unspecified dementia with behavioral disturbance: Secondary | ICD-10-CM

## 2019-10-04 DIAGNOSIS — R911 Solitary pulmonary nodule: Secondary | ICD-10-CM

## 2019-10-04 LAB — CBC WITH DIFFERENTIAL/PLATELET
Abs Immature Granulocytes: 0.11 10*3/uL — ABNORMAL HIGH (ref 0.00–0.07)
Basophils Absolute: 0 10*3/uL (ref 0.0–0.1)
Basophils Relative: 0 %
Eosinophils Absolute: 0 10*3/uL (ref 0.0–0.5)
Eosinophils Relative: 0 %
HCT: 34.4 % — ABNORMAL LOW (ref 36.0–46.0)
Hemoglobin: 11.1 g/dL — ABNORMAL LOW (ref 12.0–15.0)
Immature Granulocytes: 1 %
Lymphocytes Relative: 4 %
Lymphs Abs: 0.7 10*3/uL (ref 0.7–4.0)
MCH: 31.2 pg (ref 26.0–34.0)
MCHC: 32.3 g/dL (ref 30.0–36.0)
MCV: 96.6 fL (ref 80.0–100.0)
Monocytes Absolute: 0.5 10*3/uL (ref 0.1–1.0)
Monocytes Relative: 3 %
Neutro Abs: 15.2 10*3/uL — ABNORMAL HIGH (ref 1.7–7.7)
Neutrophils Relative %: 92 %
Platelets: 339 10*3/uL (ref 150–400)
RBC: 3.56 MIL/uL — ABNORMAL LOW (ref 3.87–5.11)
RDW: 14.3 % (ref 11.5–15.5)
WBC: 16.6 10*3/uL — ABNORMAL HIGH (ref 4.0–10.5)
nRBC: 0 % (ref 0.0–0.2)

## 2019-10-04 LAB — URINE CULTURE: Culture: NO GROWTH

## 2019-10-04 MED ORDER — IPRATROPIUM-ALBUTEROL 0.5-2.5 (3) MG/3ML IN SOLN
3.0000 mL | Freq: Three times a day (TID) | RESPIRATORY_TRACT | Status: DC
  Administered 2019-10-04 – 2019-10-06 (×7): 3 mL via RESPIRATORY_TRACT
  Filled 2019-10-04 (×7): qty 3

## 2019-10-04 NOTE — Plan of Care (Signed)
Unable to complete assessment due to pt cognitive impairment.

## 2019-10-04 NOTE — TOC Progression Note (Signed)
Transition of Care [facility]) - Progression Note    Patient Details  Name: Shelly Duran MRN: [id number] Date of Birth: October 02, 1941  [facility]) CM/SW Contact  Su Hilt, RN Phone Number: 10/04/2019, 10:53 AM  Clinical Narrative:     Spoke with Olivia Mackie at [facility], they do not have a bed at this time and have 8 waiting on a bed. I called [facility] and notified [facility] that [facility] does ot have a bed, They agree to go to [facility].       Expected Discharge Plan and Services                                                 Social Determinants of Health (SDOH) Interventions    Readmission Risk Interventions No flowsheet data found.

## 2019-10-04 NOTE — Progress Notes (Signed)
PROGRESS NOTE    Shelly Duran  XLK:[id number] DOB: 1940-11-13 DOA: 10/02/2019 PCP: Patient, No Pcp Per    Brief Narrative:  DianeLyonsis a78 y.o.femalewith a known history of Alzheimer's dementia, who presented to the emergency room from [facility] with acute onset of respiratory distress with hypoxemia with a pulse oximetry dropped down to the 40s. The patient was bagged, was given nebulized bronchodilator therapy as well as IV Solu-Medrol for suspected COPD exacerbation and was brought to the emergency room. There was no reported fever or chills.  Patient has been started on IV steroids, azithromycin and cefepime, and ceftriaxone. Chart and history reviewed    Consultants:   None  Procedures:  CT of the chest IMPRESSION: 1. No pulmonary embolus. 2. Mild cardiomegaly with right heart dilatation and reflux of contrast into the hepatic veins and IVC, consistent with elevated right heart pressures. 3. Right anterior lower rib fractures are likely subacute with some surrounding callus formation. 4. Left lobe 6 mm pulmonary nodule, possibly present on exam 11 months ago but previously obscured. Non-contrast chest CT at 6-12 months is recommended. If the nodule is stable at time of repeat CT, then future CT at 18-24 months (from today's scan) is considered optional for low-risk patients, but is recommended for high-risk patients. This recommendation follows the consensus statement: Guidelines for Management of Incidental Pulmonary Nodules Detected on CT Images: From the Fleischner Society 2017; Radiology 2017; 284:228-243. 5. Debris/mucus in the dependent trachea. 6. Emphysema. 7. Aortic atherosclerosis and coronary artery calcifications.  Aortic Atherosclerosis (ICD10-I70.0) and Emphysema (ICD10-J43.9).  Antimicrobials:   Azithromycin and ceftriaxone   Subjective: Shortness of breath improving but still not at baseline.  No fever or chills,  chest pain reported  Objective: Vitals:   10/03/19 1501 10/03/19 1948 10/03/19 2315 10/04/19 0829  BP:   118/65 127/79  Pulse:   74 71  Resp:   16 16  Temp:   98.3 F (36.8 C) (!) 97.4 F (36.3 C)  TempSrc:      SpO2: 91% 95% 91% 100%  Weight:      Height:        Intake/Output Summary (Last 24 hours) at 10/04/2019 1447 Last data filed at 10/04/2019 1310 Gross per 24 hour  Intake 2377.84 ml  Output 800 ml  Net 1577.84 ml   Filed Weights   10/02/19 1947 10/03/19 0233  Weight: 45.4 kg 49.8 kg    Examination:  General exam: Appears calm and comfortable , nad Respiratory system: Decreased air exchange, increased expiratory time, no wheezing rales rhonchi's Cardiovascular system: RRR S1 & S2 heard, no murmurs, rubs, gallops or clicks. ma. Gastrointestinal system: Abdomen is nondistended, soft and nontender. Normal bowel sounds heard. Central nervous system: awak and alert, but not fully oriented to date or place No focal neurological deficits. Extremities: no edema or cyanosis Skin: Warm dry Psychiatry: Mood & affect appropriate current setting.     Data Reviewed: I have personally reviewed following labs and imaging studies  CBC: Recent Labs  Lab 10/02/19 1935 10/03/19 0547 10/04/19 0426  WBC 21.6* 20.6* 16.6*  NEUTROABS 18.7*  --  15.2*  HGB 13.6 12.1 11.1*  HCT 42.2 38.0 34.4*  MCV 95.0 97.2 96.6  PLT 401* 368 339   Basic Metabolic Panel: Recent Labs  Lab 10/02/19 1935 10/03/19 0547  NA 135 138  K 4.5 4.1  CL 99 102  CO2 24 24  GLUCOSE 326* 149*  BUN 19 16  CREATININE 1.01* 0.84  CALCIUM 8.1* 8.2*   GFR: Estimated Creatinine Clearance: 44.8 mL/min (by C-G formula based on SCr of 0.84 mg/dL). Liver Function Tests: Recent Labs  Lab 10/02/19 1935  AST 44*  ALT 27  ALKPHOS 130*  BILITOT 1.1  PROT 6.7  ALBUMIN 3.7   No results for input(s): LIPASE, AMYLASE in the last 168 hours. No results for input(s): AMMONIA in the last 168  hours. Coagulation Profile: No results for input(s): INR, PROTIME in the last 168 hours. Cardiac Enzymes: No results for input(s): CKTOTAL, CKMB, CKMBINDEX, TROPONINI in the last 168 hours. BNP (last 3 results) No results for input(s): PROBNP in the last 8760 hours. HbA1C: No results for input(s): HGBA1C in the last 72 hours. CBG: No results for input(s): GLUCAP in the last 168 hours. Lipid Profile: No results for input(s): CHOL, HDL, LDLCALC, TRIG, CHOLHDL, LDLDIRECT in the last 72 hours. Thyroid Function Tests: Recent Labs    10/03/19 0547  TSH 2.637   Anemia Panel: No results for input(s): VITAMINB12, FOLATE, FERRITIN, TIBC, IRON, RETICCTPCT in the last 72 hours. Sepsis Labs: Recent Labs  Lab 10/02/19 1935 10/02/19 2236  PROCALCITON <0.10  --   LATICACIDVEN 3.4* 3.2*    Recent Results (from the past 240 hour(s))  Blood Culture (routine x 2)     Status: None (Preliminary result)   Collection Time: 10/02/19  7:35 PM   Specimen: BLOOD  Result Value Ref Range Status   Specimen Description BLOOD BLOOD LEFT WRIST  Final   Special Requests   Final    BOTTLES DRAWN AEROBIC AND ANAEROBIC Blood Culture adequate volume   Culture   Final    NO GROWTH 2 DAYS Performed at [facility], [address] [place], [place] [address]    Report Status PENDING  Incomplete  Blood Culture (routine x 2)     Status: None (Preliminary result)   Collection Time: 10/02/19  7:35 PM   Specimen: BLOOD  Result Value Ref Range Status   Specimen Description BLOOD BLOOD RIGHT WRIST  Final   Special Requests   Final    BOTTLES DRAWN AEROBIC AND ANAEROBIC Blood Culture adequate volume   Culture   Final    NO GROWTH 2 DAYS Performed at [facility], [address] [place], [place] [address]    Report Status PENDING  Incomplete  Urine culture     Status: None   Collection Time: 10/02/19  7:35 PM   Specimen: In/Out Cath Urine  Result Value Ref Range Status   Specimen  Description   Final    IN/OUT CATH URINE Performed at [facility], [address], [place], [place] [address]    Special Requests   Final    NONE Performed at [facility], [address], [place], [place] [address]    Culture   Final    NO GROWTH Performed at [facility], [place] [address] [place], [place] [address]    Report Status 10/04/2019 FINAL  Final  Respiratory Panel by RT PCR (Flu A&B, Covid) - Nasopharyngeal Swab     Status: None   Collection Time: 10/02/19  8:44 PM   Specimen: Nasopharyngeal Swab  Result Value Ref Range Status   SARS Coronavirus 2 by RT PCR NEGATIVE NEGATIVE Final    Comment: (NOTE) SARS-CoV-2 target nucleic acids are NOT DETECTED. The SARS-CoV-2 RNA is generally detectable in upper respiratoy specimens during the acute phase of infection. The lowest concentration of SARS-CoV-2 viral copies this assay  can detect is 131 copies/mL. A negative result does not preclude SARS-Cov-2 infection and should not be used as the sole basis for treatment or other patient management decisions. A negative result may occur with  improper specimen collection/handling, submission of specimen other than nasopharyngeal swab, presence of viral mutation(s) within the areas targeted by this assay, and inadequate number of viral copies (<131 copies/mL). A negative result must be combined with clinical observations, patient history, and epidemiological information. The expected result is Negative. Fact Sheet for Patients:  https://www.moore.com/https://www.fda.gov/media/142436/download Fact Sheet for Healthcare Providers:  https://www.young.biz/https://www.fda.gov/media/142435/download This test is not yet ap proved or cleared by the [place] FDA and  has been authorized for detection and/or diagnosis of SARS-CoV-2 by FDA under an Emergency Use Authorization (EUA). This EUA will remain  in effect (meaning this test can be used) for the duration of the COVID-19 declaration under Section  564(b)(1) of the Act, 21 U.S.C. section 360bbb-3(b)(1), unless the authorization is terminated or revoked sooner.    Influenza A by PCR NEGATIVE NEGATIVE Final   Influenza B by PCR NEGATIVE NEGATIVE Final    Comment: (NOTE) The Xpert Xpress SARS-CoV-2/FLU/RSV assay is intended as an aid in  the diagnosis of influenza from Nasopharyngeal swab specimens and  should not be used as a sole basis for treatment. Nasal washings and  aspirates are unacceptable for Xpert Xpress SARS-CoV-2/FLU/RSV  testing. Fact Sheet for Patients: https://www.moore.com/https://www.fda.gov/media/142436/download Fact Sheet for Healthcare Providers: https://www.young.biz/https://www.fda.gov/media/142435/download This test is not yet approved or cleared by the [place] FDA and  has been authorized for detection and/or diagnosis of SARS-CoV-2 by  FDA under an Emergency Use Authorization (EUA). This EUA will remain  in effect (meaning this test can be used) for the duration of the  Covid-19 declaration under Section 564(b)(1) of the Act, 21  U.S.C. section 360bbb-3(b)(1), unless the authorization is  terminated or revoked. Performed at [facility], [address] [place], [place] [id number]          Radiology Studies: CT Head Wo Contrast  Result Date: 10/02/2019 CLINICAL DATA:  78 year old female with altered mental status and altered level of consciousness. EXAM: CT HEAD WITHOUT CONTRAST TECHNIQUE: Contiguous axial images were obtained from the base of the skull through the vertex without intravenous contrast. COMPARISON:  None. FINDINGS: Brain: No evidence of acute infarction, hemorrhage, hydrocephalus, extra-axial collection or mass lesion/mass effect. Atrophy, mild chronic small-vessel white matter ischemic changes remote LEFT caudate lacunar infarct noted. Vascular: No hyperdense vessel or unexpected calcification. Skull: Normal. Negative for fracture or focal lesion. Sinuses/Orbits: No acute finding. Other: None. IMPRESSION: 1. No  evidence of acute intracranial abnormality. 2. Atrophy, mild chronic small-vessel white matter ischemic changes and remote LEFT caudate infarct. Electronically Signed   By: Harmon PierJeffrey  Hu M.D.   On: 10/02/2019 20:19   CT ANGIO CHEST PE W OR WO CONTRAST  Result Date: 10/03/2019 CLINICAL DATA:  Shortness of breath. Dementia patient with acute onset of respiratory distress and hypoxia. EXAM: CT ANGIOGRAPHY CHEST WITH CONTRAST TECHNIQUE: Multidetector CT imaging of the chest was performed using the standard protocol during bolus administration of intravenous contrast. Multiplanar CT image reconstructions and MIPs were obtained to evaluate the vascular anatomy. CONTRAST:  75mL OMNIPAQUE IOHEXOL 350 MG/ML SOLN COMPARISON:  Radiograph yesterday. Chest CT 10/28/2018 FINDINGS: Cardiovascular: There are no filling defects within the pulmonary arteries to suggest pulmonary embolus. Atherosclerosis of the thoracic aorta. No aortic dissection or aneurysm. Descending thoracic aorta is tortuous. There are coronary artery calcifications. Mild cardiomegaly  with right heart dilatation. Contrast refluxes into the hepatic veins and IVC. No pericardial effusion. Mediastinum/Nodes: No enlarged mediastinal or hilar lymph nodes. Patulous mid esophagus. Thyroid nodules which were previously evaluated by ultrasound. Lungs/Pleura: Moderate to advanced emphysema. Dependent atelectasis in both lower lobes. No pleural fluid. Minimal subpleural scarring in the right middle lobe. Improved central bronchial thickening from prior exam. There is a 6 x 6 mm nodule in the superior segment of the left lower lobe, series 6, image 42. This with likely present on prior exam but was previously obscured by adjacent atelectasis. There is dependent debris within the trachea at the thoracic inlet. Upper Abdomen: Contrast refluxes into the hepatic veins and IVC. Per abdominal aortic tortuosity. Musculoskeletal: Exaggerated thoracic kyphosis. Right anterior  lower rib fractures are likely subacute with some surrounding callus formation. No acute osseous abnormality. Review of the MIP images confirms the above findings. IMPRESSION: 1. No pulmonary embolus. 2. Mild cardiomegaly with right heart dilatation and reflux of contrast into the hepatic veins and IVC, consistent with elevated right heart pressures. 3. Right anterior lower rib fractures are likely subacute with some surrounding callus formation. 4. Left lobe 6 mm pulmonary nodule, possibly present on exam 11 months ago but previously obscured. Non-contrast chest CT at 6-12 months is recommended. If the nodule is stable at time of repeat CT, then future CT at 18-24 months (from today's scan) is considered optional for low-risk patients, but is recommended for high-risk patients. This recommendation follows the consensus statement: Guidelines for Management of Incidental Pulmonary Nodules Detected on CT Images: From the Fleischner Society 2017; Radiology 2017; 284:228-243. 5. Debris/mucus in the dependent trachea. 6. Emphysema. 7. Aortic atherosclerosis and coronary artery calcifications. Aortic Atherosclerosis (ICD10-I70.0) and Emphysema (ICD10-J43.9). Electronically Signed   By: Narda Rutherford M.D.   On: 10/03/2019 05:07   DG Chest Port 1 View  Result Date: 10/02/2019 CLINICAL DATA:  Respiratory distress. EXAM: PORTABLE CHEST 1 VIEW COMPARISON:  October 28, 2018 FINDINGS: The heart size remains enlarged. Emphysematous changes are noted bilaterally. The lungs are hyperexpanded. There is an opacity at the left lung base which may represent atelectasis or infiltrate in combination with a small left-sided pleural effusion. There is no pneumothorax. IMPRESSION: 1. Stable chest x-ray. 2. Left basilar opacity may represent atelectasis or infiltrate in combination with a small left pleural effusion. 3. Emphysema. 4. Cardiomegaly. Electronically Signed   By: Katherine Mantle M.D.   On: 10/02/2019 19:47         Scheduled Meds: . cholecalciferol  1,000 Units Oral Daily  . [START ON 10/05/2019] cyanocobalamin  1,000 mcg Intramuscular Q30 days  . enoxaparin (LOVENOX) injection  40 mg Subcutaneous Q24H  . feeding supplement (ENSURE ENLIVE)  237 mL Oral BID BM  . guaiFENesin  600 mg Oral BID  . ipratropium-albuterol  3 mL Nebulization TID  . methimazole  5 mg Oral Daily  . methylPREDNISolone (SOLU-MEDROL) injection  60 mg Intravenous Q8H  . multivitamin with minerals  1 tablet Oral Daily  . risperiDONE  1 mg Oral BID   Continuous Infusions: . sodium chloride 100 mL/hr at 10/03/19 1600  . azithromycin 500 mg (10/03/19 2050)  . cefTRIAXone (ROCEPHIN)  IV 2 g (10/04/19 1016)    Assessment & Plan:   Active Problems:   COPD exacerbation (HCC)   DNR (do not resuscitate)   1.  Severe COPD exacerbation possibly secondary to left basal pneumonia likely with parapneumonic effusion.   Slowly improving, still not at baseline Will continue  azithromycin and ceftriaxone Follow-up cultures  2.  Acute hypoxic respiratory failure.  Secondary to #1.  O2 protocol will be followed. Will try to wean off oxygen  3.  Elevated D-dimer.   CTA negative for PE  4.  Hyperthyroidism.   continue Tapazole  TSH within normal limits  5.  Dementia with behavioral changes.  We will continue Risperdal  6.Pulmonary nodule- on CT chest Rec. Non-contrast chest CT at 6-12 months is recommended Please see full result  DVT prophylaxis: Lovenox Code Status: DNR/DNI Family Communication: None at bedside Disposition Plan: Likely DC in 1 or 2 more days will need Covid testing prior to going back to snf       LOS: 2 days   Time spent: 45 minutes with more than 50% COC    Lynn Ito, MD [facility] Pager [phone]  If 7PM-7AM, please contact night-coverage [email or web address] Password [facility] 10/04/2019, 2:47 PM

## 2019-10-05 LAB — BRAIN NATRIURETIC PEPTIDE: B Natriuretic Peptide: 219 pg/mL — ABNORMAL HIGH (ref 0.0–100.0)

## 2019-10-05 LAB — SARS CORONAVIRUS 2 (TAT 6-24 HRS): SARS Coronavirus 2: NEGATIVE

## 2019-10-05 LAB — LACTIC ACID, PLASMA: Lactic Acid, Venous: 2.9 mmol/L (ref 0.5–1.9)

## 2019-10-05 MED ORDER — FUROSEMIDE 10 MG/ML IJ SOLN
20.0000 mg | Freq: Once | INTRAMUSCULAR | Status: AC
  Administered 2019-10-05: 15:00:00 20 mg via INTRAVENOUS
  Filled 2019-10-05: qty 4

## 2019-10-05 MED ORDER — PREDNISONE 20 MG PO TABS
40.0000 mg | ORAL_TABLET | Freq: Every day | ORAL | Status: DC
  Administered 2019-10-05 – 2019-10-06 (×2): 40 mg via ORAL
  Filled 2019-10-05 (×2): qty 2

## 2019-10-05 NOTE — TOC Progression Note (Signed)
Transition of Care [facility]) - Progression Note    Patient Details  Name: Shelly Duran MRN: [id number] Date of Birth: [birth year]  Transition of Care [facility]) CM/SW [place], [place] Phone Number: 10/05/2019, 2:46 PM  Clinical Narrative:     CSW faxed over patient's recent COVID results from 10/04/2019.  Patient will be going to [facility] SNF. Possible discharge for tomorrow, 10/06/2019.       Expected Discharge Plan and Services                                                 Social Determinants of Health (SDOH) Interventions    Readmission Risk Interventions No flowsheet data found.

## 2019-10-05 NOTE — Progress Notes (Signed)
Pts O2 83% on room air, Pt placed on 2L nasal cannula. Now 93%. MD notified. No orders received.

## 2019-10-05 NOTE — Progress Notes (Signed)
SATURATION QUALIFICATIONS:  Patient Saturations on Room Air at Rest = 83%  Patient Saturations on 2L =93 % Liters of oxygen while   Please briefly explain why patient needs home oxygen: desaturation in O2

## 2019-10-05 NOTE — Progress Notes (Signed)
Occupational Therapy Treatment Patient Details Name: Shelly Duran MRN: [id number] DOB: [birth year] Today's Date: 10/05/2019    History of present illness 78 yo female admitted after presenting to ED from SNF with acute onset respiratory distress, pulse ox dropped to 40s, portable chest xray showed left basal opacity that may represent atelectasis or infiltrate with a small left pleural effusion, emphysema, cardiomegaly   OT comments  [name] was seen for OT treatment on this date. Upon arrival to room pt awake/alert, semi-supine in bed with [place] and external catheter removed. OT assists pt with replacing [place] and pt spO2 sats improve from 86% on RA to 94-95% on 2L. OT assists pt with toilet transfer and peri-care as pt initially begins to urinate on the floor during attempts at standing grooming tasks. See ADL section for detail. Pt continues to require min assist for functional mobility and max assist for toilet hygrine mgt. NT in room to change bed linens as pt had urinated through bedding this date. Pt progressing toward goals and continues to benefit from skilled OT services to maximize return to PLOF and minimize risk of future falls, injury, caregiver burden, and readmission. Will continue to follow POC. Discharge recommendation remains appropriate.    Follow Up Recommendations  SNF;Supervision/Assistance - 24 hour    Equipment Recommendations  Other (comment)(TBD)    Recommendations for Other Services      Precautions / Restrictions Precautions Precautions: Fall Restrictions Weight Bearing Restrictions: No       Mobility Bed Mobility Overal bed mobility: Needs Assistance Bed Mobility: Supine to Sit;Sit to Supine     Supine to sit: Min assist Sit to supine: Min assist   General bed mobility comments: assist to initiate movement  Transfers Overall transfer level: Needs assistance Equipment used: 1 person hand held assist Transfers: Sit to/from [employer] Sit to Stand: Min assist;Min guard Stand pivot transfers: Min guard;Min assist       General transfer comment: Pt has difficulty rising from seated surface this date. Decreased safety awareness with attempts to stand while urinating and requiring mod-max assist to maintain standing balance on wet floor. Therapist cleans floor and assists pt with transfer to [facility] safely.    Balance Overall balance assessment: Needs assistance Sitting-balance support: No upper extremity supported;Feet supported Sitting balance-Leahy Scale: Fair       Standing balance-Leahy Scale: Poor Standing balance comment: L lateral lean/weight shift                           ADL either performed or assessed with clinical judgement   ADL Overall ADL's : Needs assistance/impaired                                       General ADL Comments: Pt demonstrates decreased safety awareness this  date and continues to be functionally limited by cognition and cardiopulmonary status. She requires min A to initiate STS transfers from bed and close CGA to min A during 1 person hand held assist to stand-pivot transfer to the [facility]. Total assist for peri-care this date. When this therapist handed the pt toilet paper to wipe herself after using the [facility], pt attempts to wipe her foot. Is unable to complete toilet hygine.     Vision Baseline Vision/History: Wears glasses Wears Glasses: Reading only Patient Visual Report: No change from baseline  Perception     Praxis      Cognition Arousal/Alertness: Awake/alert Behavior During Therapy: Impulsive;WFL for tasks assessed/performed Overall Cognitive Status: History of cognitive impairments - at baseline                                 General Comments: oriented to self only; follows simple commands, optimized with hand-over-hand, gestures.  Easily distrated by external environment        Exercises Other Exercises Other  Exercises: Toilet transfer to/from [facility] with RW, min/mod assist for balance, task sequencing; dep assist for hygiene. Other Exercises: OT attempts to engage pt in standing grooming activities at sink this date. Pt requires emergent use of BSC. OT provides assist for functional transfer and assists pt with peri-care. See ADL section for additional details.   Shoulder Instructions       General Comments Pt recieved semi-supine in bed with [place] and external catheter removed and laying to the side. Pt spO2 sats at 86% but quickly rebound to 94-95 with replacement of [place].    Pertinent Vitals/ Pain       Pain Assessment: No/denies pain  Home Living Family/patient expects to be discharged to:: Assisted living                                        Prior Functioning/Environment              Frequency  Min 2X/week        Progress Toward Goals  OT Goals(current goals can now be found in the care plan section)  Progress towards OT goals: Progressing toward goals  Acute Rehab OT Goals Patient Stated Goal: Pt unable to state OT Goal Formulation: Patient unable to participate in goal setting Time For Goal Achievement: 10/17/19 Potential to Achieve Goals: Fair  Plan Discharge plan remains appropriate;Frequency remains appropriate    Co-evaluation                 AM-PAC OT "6 Clicks" Daily Activity     Outcome Measure   Help from another person eating meals?: A Little Help from another person taking care of personal grooming?: A Little Help from another person toileting, which includes using toliet, bedpan, or urinal?: A Lot Help from another person bathing (including washing, rinsing, drying)?: A Lot Help from another person to put on and taking off regular upper body clothing?: A Little Help from another person to put on and taking off regular lower body clothing?: A Lot 6 Click Score: 15    End of Session Equipment Utilized During Treatment: Gait  belt;Rolling walker  OT Visit Diagnosis: Other abnormalities of gait and mobility (R26.89);Muscle weakness (generalized) (M62.81)   Activity Tolerance Patient tolerated treatment well   Patient Left in chair;with bed alarm set;with call bell/phone within reach   Nurse Communication          Time: 5726-2035 OT Time Calculation (min): 18 min  Charges: OT General Charges $OT Visit: 1 Visit OT Treatments $Self Care/Home Management : 8-22 mins  Rockney Ghee, M.S., OTR/L Ascom: [phone] 10/05/19, 3:57 PM

## 2019-10-05 NOTE — Progress Notes (Signed)
Physical Therapy Treatment Patient Details Name: Shelly Duran MRN: [id number] DOB: [birth year] Today's Date: 10/05/2019    History of Present Illness 78 yo female admitted after presenting to ED from SNF with acute onset respiratory distress, pulse ox dropped to 40s, portable chest xray showed left basal opacity that may represent atelectasis or infiltrate with a small left pleural effusion, emphysema, cardiomegaly    PT Comments    Patient requiring increased physical assist for sit/stand, standing balance and gait this date (min/mod assist with RW). Demonstrates L lateral lean with limited attempts at self-correction.  Unsafe/unable to attempt without RW and +1 assist at all times. Of note, patient with incontinent bowel movement during gait trial; unable to communicate needs to therapist.  Dep assist for hygiene, clothing management. Anticipate possible need for O2 at discharge (unclear if O2 baseline for patient), as noted with desat to 85% on RA with exertion.  Does recover to >92% on 2L supplemental O2.  SaO2 on room air at rest = 90% SaO2 on room air while ambulating = 85% SaO2 at rest on 2L = 95%      Follow Up Recommendations  SNF;Supervision/Assistance - 24 hour     Equipment Recommendations  Rolling walker with 5" wheels    Recommendations for Other Services       Precautions / Restrictions Precautions Precautions: Fall Restrictions Weight Bearing Restrictions: No    Mobility  Bed Mobility Overal bed mobility: Needs Assistance Bed Mobility: Supine to Sit;Sit to Supine     Supine to sit: Min assist Sit to supine: Min assist   General bed mobility comments: assist to initiate movement  Transfers Overall transfer level: Needs assistance Equipment used: Rolling walker (2 wheeled) Transfers: Sit to/from Stand Sit to Stand: Min assist         General transfer comment: L lateral lean/weight shift; hand-over-hand for UE placement on  RW  Ambulation/Gait Ambulation/Gait assistance: Min assist;Mod assist Gait Distance (Feet): 60 Feet Assistive device: Rolling walker (2 wheeled)       General Gait Details: reciprocal stepping pattern, shufflings steps with L lateral lean/weight shift.  Min/mod assist for balance correction and weight shift.  Assist for walker placement and use.   Stairs             Wheelchair Mobility    Modified Rankin (Stroke Patients Only)       Balance Overall balance assessment: Needs assistance Sitting-balance support: No upper extremity supported;Feet supported Sitting balance-Leahy Scale: Fair       Standing balance-Leahy Scale: Poor Standing balance comment: L lateral lean/weight shift                            Cognition   Behavior During Therapy: WFL for tasks assessed/performed                                   General Comments: oriented to self only; follows simple commands, optimized with hand-over-hand, gestures.  Easily distrated by external environment      Exercises Other Exercises Other Exercises: Toilet transfer to/from [facility] with RW, min/mod assist for balance, task sequencing; dep assist for hygiene.    General Comments        Pertinent Vitals/Pain Pain Assessment: No/denies pain    Home Living Family/patient expects to be discharged to:: Assisted living  Prior Function            PT Goals (current goals can now be found in the care plan section) Acute Rehab PT Goals Patient Stated Goal: Pt unable to state PT Goal Formulation: Patient unable to participate in goal setting Progress towards PT goals: Progressing toward goals    Frequency    Min 2X/week      PT Plan Current plan remains appropriate    Co-evaluation              AM-PAC PT "6 Clicks" Mobility   Outcome Measure  Help needed turning from your back to your side while in a flat bed without using bedrails?: A  Little Help needed moving from lying on your back to sitting on the side of a flat bed without using bedrails?: A Little Help needed moving to and from a bed to a chair (including a wheelchair)?: A Lot Help needed standing up from a chair using your arms (e.g., wheelchair or bedside chair)?: A Lot Help needed to walk in hospital room?: A Lot Help needed climbing 3-5 steps with a railing? : A Lot 6 Click Score: 14    End of Session Equipment Utilized During Treatment: Gait belt Activity Tolerance: Patient tolerated treatment well Patient left: with call bell/phone within reach;in bed;with bed alarm set Nurse Communication: Mobility status PT Visit Diagnosis: Muscle weakness (generalized) (M62.81);Difficulty in walking, not elsewhere classified (R26.2);History of falling (Z91.81)     Time: 5329-9242 PT Time Calculation (min) (ACUTE ONLY): 24 min  Charges:  $Gait Training: 8-22 mins $Therapeutic Activity: 8-22 mins                     Cesilia Shinn H. Manson Passey, PT, DPT, NCS 10/05/19, 2:09 PM [phone]

## 2019-10-05 NOTE — Progress Notes (Signed)
PROGRESS NOTE    Shelly Duran  WUJ:[id number] DOB: June 20, 1941 DOA: 10/02/2019 PCP: Patient, No Pcp Per    Brief Narrative:  DianeLyonsis a78 y.o.femalewith a known history of Alzheimer's dementia, who presented to the emergency room from [facility] with acute onset of respiratory distress with hypoxemia with a pulse oximetry dropped down to the 40s. The patient was bagged, was given nebulized bronchodilator therapy as well as IV Solu-Medrol for suspected COPD exacerbation and was brought to the emergency room. There was no reported fever or chills.  Patient has been started on IV steroids, azithromycin and cefepime, and ceftriaxone. Chart and history reviewed    Consultants:   None  Procedures:  CT of the chest IMPRESSION: 1. No pulmonary embolus. 2. Mild cardiomegaly with right heart dilatation and reflux of contrast into the hepatic veins and IVC, consistent with elevated right heart pressures. 3. Right anterior lower rib fractures are likely subacute with some surrounding callus formation. 4. Left lobe 6 mm pulmonary nodule, possibly present on exam 11 months ago but previously obscured. Non-contrast chest CT at 6-12 months is recommended. If the nodule is stable at time of repeat CT, then future CT at 18-24 months (from today's scan) is considered optional for low-risk patients, but is recommended for high-risk patients. This recommendation follows the consensus statement: Guidelines for Management of Incidental Pulmonary Nodules Detected on CT Images: From the Fleischner Society 2017; Radiology 2017; 284:228-243. 5. Debris/mucus in the dependent trachea. 6. Emphysema. 7. Aortic atherosclerosis and coronary artery calcifications.  Aortic Atherosclerosis (ICD10-I70.0) and Emphysema (ICD10-J43.9).  Antimicrobials:   Azithromycin and ceftriaxone   Subjective: Feeling better but sob at baseline.  Requiring 2 L of oxygen on rest 02sat at  82%.  Denies any chest pain. Objective: Vitals:   10/04/19 2304 10/05/19 0817 10/05/19 0912 10/05/19 0915  BP: (!) 153/82 (!) 149/92    Pulse: 69 78    Resp: 18 16    Temp: 98.2 F (36.8 C) 97.9 F (36.6 C)    TempSrc: Oral Oral    SpO2: 100% 97% (!) 83% 93%  Weight:      Height:        Intake/Output Summary (Last 24 hours) at 10/05/2019 1338 Last data filed at 10/05/2019 0950 Gross per 24 hour  Intake 471.18 ml  Output 550 ml  Net -78.82 ml   Filed Weights   10/02/19 1947 10/03/19 0233  Weight: 45.4 kg 49.8 kg    Examination:  General exam: Appears calm and comfortable , nad, non tachypnic Respiratory system: Decreased air exchange, increased expiratory time, +fine crackles b/l  Cardiovascular system: RRR S1 & S2 heard, no m/r/g  Gastrointestinal system: Abdomen is nondistended, soft and nontender. +bs Central nervous system: awak , cooperative with exam.  No focal neurological deficits. Extremities: no edema or cyanosis Skin: Warm dry Psychiatry: Mood & affect appropriate current setting.     Data Reviewed: I have personally reviewed following labs and imaging studies  CBC: Recent Labs  Lab 10/02/19 1935 10/03/19 0547 10/04/19 0426  WBC 21.6* 20.6* 16.6*  NEUTROABS 18.7*  --  15.2*  HGB 13.6 12.1 11.1*  HCT 42.2 38.0 34.4*  MCV 95.0 97.2 96.6  PLT 401* 368 339   Basic Metabolic Panel: Recent Labs  Lab 10/02/19 1935 10/03/19 0547  NA 135 138  K 4.5 4.1  CL 99 102  CO2 24 24  GLUCOSE 326* 149*  BUN 19 16  CREATININE 1.01* 0.84  CALCIUM 8.1* 8.2*  GFR: Estimated Creatinine Clearance: 44.8 mL/min (by C-G formula based on SCr of 0.84 mg/dL). Liver Function Tests: Recent Labs  Lab 10/02/19 1935  AST 44*  ALT 27  ALKPHOS 130*  BILITOT 1.1  PROT 6.7  ALBUMIN 3.7   No results for input(s): LIPASE, AMYLASE in the last 168 hours. No results for input(s): AMMONIA in the last 168 hours. Coagulation Profile: No results for input(s): INR,  PROTIME in the last 168 hours. Cardiac Enzymes: No results for input(s): CKTOTAL, CKMB, CKMBINDEX, TROPONINI in the last 168 hours. BNP (last 3 results) No results for input(s): PROBNP in the last 8760 hours. HbA1C: No results for input(s): HGBA1C in the last 72 hours. CBG: No results for input(s): GLUCAP in the last 168 hours. Lipid Profile: No results for input(s): CHOL, HDL, LDLCALC, TRIG, CHOLHDL, LDLDIRECT in the last 72 hours. Thyroid Function Tests: Recent Labs    10/03/19 0547  TSH 2.637   Anemia Panel: No results for input(s): VITAMINB12, FOLATE, FERRITIN, TIBC, IRON, RETICCTPCT in the last 72 hours. Sepsis Labs: Recent Labs  Lab 10/02/19 1935 10/02/19 2236 10/05/19 1024  PROCALCITON <0.10  --   --   LATICACIDVEN 3.4* 3.2* 2.9*    Recent Results (from the past 240 hour(s))  Blood Culture (routine x 2)     Status: None (Preliminary result)   Collection Time: 10/02/19  7:35 PM   Specimen: BLOOD  Result Value Ref Range Status   Specimen Description BLOOD BLOOD LEFT WRIST  Final   Special Requests   Final    BOTTLES DRAWN AEROBIC AND ANAEROBIC Blood Culture adequate volume   Culture   Final    NO GROWTH 3 DAYS Performed at [facility], [address] [place], [place] [address]    Report Status PENDING  Incomplete  Blood Culture (routine x 2)     Status: None (Preliminary result)   Collection Time: 10/02/19  7:35 PM   Specimen: BLOOD  Result Value Ref Range Status   Specimen Description BLOOD BLOOD RIGHT WRIST  Final   Special Requests   Final    BOTTLES DRAWN AEROBIC AND ANAEROBIC Blood Culture adequate volume   Culture   Final    NO GROWTH 3 DAYS Performed at [facility], [address], [place], [place] [address]    Report Status PENDING  Incomplete  Urine culture     Status: None   Collection Time: 10/02/19  7:35 PM   Specimen: In/Out Cath Urine  Result Value Ref Range Status   Specimen Description   Final    IN/OUT CATH  URINE Performed at [facility], [address], [place], [place] [address]    Special Requests   Final    NONE Performed at [facility], [address], [place], [place] [address]    Culture   Final    NO GROWTH Performed at [facility], [address], [place], [place] [address]    Report Status 10/04/2019 FINAL  Final  Respiratory Panel by RT PCR (Flu A&B, Covid) - Nasopharyngeal Swab     Status: None   Collection Time: 10/02/19  8:44 PM   Specimen: Nasopharyngeal Swab  Result Value Ref Range Status   SARS Coronavirus 2 by RT PCR NEGATIVE NEGATIVE Final    Comment: (NOTE) SARS-CoV-2 target nucleic acids are NOT DETECTED. The SARS-CoV-2 RNA is generally detectable in upper respiratoy specimens during the acute phase of infection. The lowest concentration of SARS-CoV-2 viral copies this  assay can detect is 131 copies/mL. A negative result does not preclude SARS-Cov-2 infection and should not be used as the sole basis for treatment or other patient management decisions. A negative result may occur with  improper specimen collection/handling, submission of specimen other than nasopharyngeal swab, presence of viral mutation(s) within the areas targeted by this assay, and inadequate number of viral copies (<131 copies/mL). A negative result must be combined with clinical observations, patient history, and epidemiological information. The expected result is Negative. Fact Sheet for Patients:  https://www.moore.com/https://www.fda.gov/media/142436/download Fact Sheet for Healthcare Providers:  https://www.young.biz/https://www.fda.gov/media/142435/download This test is not yet ap proved or cleared by the [place] FDA and  has been authorized for detection and/or diagnosis of SARS-CoV-2 by FDA under an Emergency Use Authorization (EUA). This EUA will remain  in effect (meaning this test can be used) for the duration of the COVID-19 declaration under Section 564(b)(1) of the Act, 21  U.S.C. section 360bbb-3(b)(1), unless the authorization is terminated or revoked sooner.    Influenza A by PCR NEGATIVE NEGATIVE Final   Influenza B by PCR NEGATIVE NEGATIVE Final    Comment: (NOTE) The Xpert Xpress SARS-CoV-2/FLU/RSV assay is intended as an aid in  the diagnosis of influenza from Nasopharyngeal swab specimens and  should not be used as a sole basis for treatment. Nasal washings and  aspirates are unacceptable for Xpert Xpress SARS-CoV-2/FLU/RSV  testing. Fact Sheet for Patients: https://www.moore.com/https://www.fda.gov/media/142436/download Fact Sheet for Healthcare Providers: https://www.young.biz/https://www.fda.gov/media/142435/download This test is not yet approved or cleared by the [place] FDA and  has been authorized for detection and/or diagnosis of SARS-CoV-2 by  FDA under an Emergency Use Authorization (EUA). This EUA will remain  in effect (meaning this test can be used) for the duration of the  Covid-19 declaration under Section 564(b)(1) of the Act, 21  U.S.C. section 360bbb-3(b)(1), unless the authorization is  terminated or revoked. Performed at [facility], [address] [place], [place] [address]   SARS CORONAVIRUS 2 (TAT 6-24 HRS) Nasopharyngeal     Status: None   Collection Time: 10/04/19  4:30 PM   Specimen: Nasopharyngeal  Result Value Ref Range Status   SARS Coronavirus 2 NEGATIVE NEGATIVE Final    Comment: (NOTE) SARS-CoV-2 target nucleic acids are NOT DETECTED. The SARS-CoV-2 RNA is generally detectable in upper and lower respiratory specimens during the acute phase of infection. Negative results do not preclude SARS-CoV-2 infection, do not rule out co-infections with other pathogens, and should not be used as the sole basis for treatment or other patient management decisions. Negative results must be combined with clinical observations, patient history, and epidemiological information. The expected result is Negative. Fact Sheet for  Patients: HairSlick.nohttps://www.fda.gov/media/138098/download Fact Sheet for Healthcare Providers: quierodirigir.comhttps://www.fda.gov/media/138095/download This test is not yet approved or cleared by the [place] FDA and  has been authorized for detection and/or diagnosis of SARS-CoV-2 by FDA under an Emergency Use Authorization (EUA). This EUA will remain  in effect (meaning this test can be used) for the duration of the COVID-19 declaration under Section 56 4(b)(1) of the Act, 21 U.S.C. section 360bbb-3(b)(1), unless the authorization is terminated or revoked sooner. Performed at [facility], [address] [place], [place] [address]          Radiology Studies: No results found.      Scheduled Meds: . cholecalciferol  1,000 Units Oral Daily  . cyanocobalamin  1,000 mcg Intramuscular Q30 days  . enoxaparin (LOVENOX) injection  40 mg Subcutaneous Q24H  .  feeding supplement (ENSURE ENLIVE)  237 mL Oral BID BM  . furosemide  20 mg Intravenous Once  . guaiFENesin  600 mg Oral BID  . ipratropium-albuterol  3 mL Nebulization TID  . methimazole  5 mg Oral Daily  . multivitamin with minerals  1 tablet Oral Daily  . predniSONE  40 mg Oral Q breakfast  . risperiDONE  1 mg Oral BID   Continuous Infusions: . azithromycin Stopped (10/04/19 2358)  . cefTRIAXone (ROCEPHIN)  IV 2 g (10/05/19 0934)    Assessment & Plan:   Active Problems:   COPD exacerbation ([place])   DNR (do not resuscitate)   1.  Severe COPD exacerbation possibly secondary to left basal pneumonia likely with parapneumonic effusion.   Slowly improving, still not at baseline Continue azithromycin and ceftriaxone Bcx pending  Covid negative  2.  Acute hypoxic respiratory failure.  Secondary to #1.  O2 protocol will be followed. Unable to  wean off oxygen.  Mildly vol. Overloaded too with increase bnp from baseline. Will give lasix 20mg  iv x1 today. Reassess daily  3.  Elevated D-dimer.   CTA negative for PE  4.   Hyperthyroidism.   continue Tapazole  TSH within normal limits  5.  Dementia with behavioral changes.  We will continue Risperdal  6.Pulmonary nodule- on CT chest Rec. Non-contrast chest CT at 6-12 months is recommended Please see full result  DVT prophylaxis: Lovenox Code Status: DNR/DNI Family Communication: None at bedside Disposition Plan: Likely DC in 1 or 2 more days to SNF.      LOS: 3 days   Time spent: 45 minutes with more than 50% COC    Nolberto Hanlon, MD [facility] Pager [phone]  If 7PM-7AM, please contact night-coverage [email or web address] Password [email or web address] 10/05/2019, 1:38 PM Patient ID: Shelly Duran, female   DOB: [birth year], 78 y.o.   MRN: [id number]

## 2019-10-06 ENCOUNTER — Other Ambulatory Visit: Payer: Self-pay

## 2019-10-06 DIAGNOSIS — J9602 Acute respiratory failure with hypercapnia: Secondary | ICD-10-CM

## 2019-10-06 DIAGNOSIS — E039 Hypothyroidism, unspecified: Secondary | ICD-10-CM

## 2019-10-06 DIAGNOSIS — J9601 Acute respiratory failure with hypoxia: Principal | ICD-10-CM

## 2019-10-06 LAB — CBC
HCT: 36.3 % (ref 36.0–46.0)
Hemoglobin: 12.2 g/dL (ref 12.0–15.0)
MCH: 30.7 pg (ref 26.0–34.0)
MCHC: 33.6 g/dL (ref 30.0–36.0)
MCV: 91.2 fL (ref 80.0–100.0)
Platelets: 351 10*3/uL (ref 150–400)
RBC: 3.98 MIL/uL (ref 3.87–5.11)
RDW: 14.2 % (ref 11.5–15.5)
WBC: 11.2 10*3/uL — ABNORMAL HIGH (ref 4.0–10.5)
nRBC: 0 % (ref 0.0–0.2)

## 2019-10-06 LAB — BASIC METABOLIC PANEL
Anion gap: 10 (ref 5–15)
BUN: 27 mg/dL — ABNORMAL HIGH (ref 8–23)
CO2: 30 mmol/L (ref 22–32)
Calcium: 8.3 mg/dL — ABNORMAL LOW (ref 8.9–10.3)
Chloride: 102 mmol/L (ref 98–111)
Creatinine, Ser: 0.7 mg/dL (ref 0.44–1.00)
GFR calc Af Amer: >60 mL/min (ref >60)
GFR calc non Af Amer: >60 mL/min (ref >60)
Glucose, Bld: 95 mg/dL (ref 70–99)
Potassium: 3.3 mmol/L — ABNORMAL LOW (ref 3.5–5.1)
Sodium: 142 mmol/L (ref 135–145)

## 2019-10-06 MED ORDER — SODIUM CHLORIDE 0.9% FLUSH: 3.0000 mL | INTRAVENOUS | Status: DC | PRN

## 2019-10-06 MED ORDER — POTASSIUM CHLORIDE CRYS ER 20 MEQ PO TBCR
20.0000 meq | EXTENDED_RELEASE_TABLET | Freq: Two times a day (BID) | ORAL | Status: DC
  Administered 2019-10-06: 08:00:00 20 meq via ORAL
  Filled 2019-10-06: qty 1

## 2019-10-06 MED ORDER — PREDNISONE 20 MG PO TABS: 40.0000 mg | ORAL_TABLET | Freq: Every day | ORAL | Status: DC

## 2019-10-06 MED ORDER — PREDNISONE 20 MG PO TABS: 40.0000 mg | ORAL_TABLET | Freq: Every day | ORAL | Status: AC

## 2019-10-06 MED ORDER — GUAIFENESIN ER 600 MG PO TB12: 600.0000 mg | ORAL_TABLET | Freq: Two times a day (BID) | ORAL | Status: AC

## 2019-10-06 MED ORDER — FAMOTIDINE 20 MG PO TABS
20.0000 mg | ORAL_TABLET | Freq: Every day | ORAL | Status: DC
  Administered 2019-10-06: 10:00:00 20 mg via ORAL
  Filled 2019-10-06: qty 1

## 2019-10-06 MED ORDER — SODIUM CHLORIDE 0.9% FLUSH
3.0000 mL | Freq: Two times a day (BID) | INTRAVENOUS | Status: DC
  Administered 2019-10-06: 3 mL via INTRAVENOUS

## 2019-10-06 MED ORDER — AMOXICILLIN-POT CLAVULANATE 875-125 MG PO TABS
1.0000 | ORAL_TABLET | Freq: Two times a day (BID) | ORAL | Status: DC
  Administered 2019-10-06: 1 via ORAL
  Filled 2019-10-06: qty 1

## 2019-10-06 MED ORDER — ENSURE ENLIVE PO LIQD: 237.0000 mL | Freq: Two times a day (BID) | ORAL | 12 refills | Status: AC

## 2019-10-06 MED ORDER — FAMOTIDINE 20 MG PO TABS: 20.0000 mg | ORAL_TABLET | Freq: Every day | ORAL | Status: AC

## 2019-10-06 MED ORDER — HYDROCOD POLST-CPM POLST ER 10-8 MG/5ML PO SUER: 5.0000 mL | Freq: Two times a day (BID) | ORAL | Status: AC | PRN

## 2019-10-06 MED ORDER — PREDNISONE 20 MG PO TABS: 20.0000 mg | ORAL_TABLET | Freq: Every day | ORAL | Status: DC

## 2019-10-06 MED ORDER — AMOXICILLIN-POT CLAVULANATE 875-125 MG PO TABS: 1.0000 | ORAL_TABLET | Freq: Two times a day (BID) | ORAL | Status: DC

## 2019-10-06 MED ORDER — PREDNISONE 20 MG PO TABS: 20.0000 mg | ORAL_TABLET | Freq: Every day | ORAL | Status: AC

## 2019-10-06 NOTE — TOC Progression Note (Signed)
Transition of Care [facility]) - Progression Note    Patient Details  Name: Shelly Duran MRN: [id number] Date of Birth: Dec 04, 1940  Transition of Care [facility]) CM/SW Contact  Truitt Merle, LCSW Phone Number: 10/06/2019, 4:14 PM  Clinical Narrative:    Patient ready for discharged today to [facility]). RNCM Yetta Glassman updated son. LCSW spoke with Claiborne Billings in admission at [facility] to confirm patient bed available today. Updated Dr. Kurtis Bushman and RN Opal Sidles. with room number 7 and report number. Patient to be transported via [facility]. Packet completed and put on chart including signed DNR. Sent d/c summary and updated, signed FL-2 to [facility] via the hub.      Barriers to Discharge: No Barriers Identified  Expected Discharge Plan and Services           Expected Discharge Date: 10/06/19                                     Social Determinants of Health (SDOH) Interventions    Readmission Risk Interventions No flowsheet data found.

## 2019-10-06 NOTE — Progress Notes (Signed)
[facility] here to transport pt to [facility]. Discharged.

## 2019-10-06 NOTE — Discharge Instructions (Signed)
Acute Respiratory Failure, Adult ° °Acute respiratory failure occurs when there is not enough oxygen passing from your lungs to your body. When this happens, your lungs have trouble removing carbon dioxide from the blood. This causes your blood oxygen level to drop too low as carbon dioxide builds up. °Acute respiratory failure is a medical emergency. It can develop quickly, but it is temporary if treated promptly. Your lung capacity, or how much air your lungs can hold, may improve with time, exercise, and treatment. °What are the causes? °There are many possible causes of acute respiratory failure, including: °· Lung injury. °· Chest injury or damage to the ribs or tissues near the lungs. °· Lung conditions that affect the flow of air and blood into and out of the lungs, such as pneumonia, acute respiratory distress syndrome, and cystic fibrosis. °· Medical conditions, such as strokes or spinal cord injuries, that affect the muscles and nerves that control breathing. °· Blood infection (sepsis). °· Inflammation of the pancreas (pancreatitis). °· A blood clot in the lungs (pulmonary embolism). °· A large-volume blood transfusion. °· Burns. °· Near-drowning. °· Seizure. °· Smoke inhalation. °· Reaction to medicines. °· Alcohol or drug overdose. °What increases the risk? °This condition is more likely to develop in people who have: °· A blocked airway. °· Asthma. °· A condition or disease that damages or weakens the muscles, nerves, bones, or tissues that are involved in breathing. °· A serious infection. °· A health problem that blocks the unconscious reflex that is involved in breathing, such as hypothyroidism or sleep apnea. °· A lung injury or trauma. °What are the signs or symptoms? °Trouble breathing is the main symptom of acute respiratory failure. Symptoms may also include: °· Rapid breathing. °· Restlessness or anxiety. °· Skin, lips, or fingernails that appear blue (cyanosis). °· Rapid heart  rate. °· Abnormal heart rhythms (arrhythmias). °· Confusion or changes in behavior. °· Tiredness or loss of energy. °· Feeling sleepy or having a loss of consciousness. °How is this diagnosed? °Your health care provider can diagnose acute respiratory failure with a medical history and physical exam. During the exam, your health care provider will listen to your heart and check for crackling or wheezing sounds in your lungs. Your may also have tests to confirm the diagnosis and determine what is causing respiratory failure. These tests may include: °· Measuring the amount of oxygen in your blood (pulse oximetry). The measurement comes from a small device that is placed on your finger, earlobe, or toe. °· Other blood tests to measure blood gases and to look for signs of infection. °· Sampling your cerebral spinal fluid or tracheal fluid to check for infections. °· Chest X-ray to look for fluid in spaces that should be filled with air. °· Electrocardiogram (ECG) to look at the heart's electrical activity. °How is this treated? °Treatment for this condition usually takes places in a hospital intensive care unit (ICU). Treatment depends on what is causing the condition. It may include one or more treatments until your symptoms improve. Treatment may include: °· Supplemental oxygen. Extra oxygen is given through a tube in the nose, a face mask, or a hood. °· A device such as a continuous positive airway pressure (CPAP) or bi-level positive airway pressure (BiPAP or BPAP) machine. This treatment uses mild air pressure to keep the airways open. A mask or other device will be placed over your nose or mouth. A tube that is connected to a motor will deliver oxygen through the   mask. °· Ventilator. This treatment helps move air into and out of the lungs. This may be done with a bag and mask or a machine. For this treatment, a tube is placed in your windpipe (trachea) so air and oxygen can flow to the lungs. °· Extracorporeal  membrane oxygenation (ECMO). This treatment temporarily takes over the function of the heart and lungs, supplying oxygen and removing carbon dioxide. ECMO gives the lungs a chance to recover. It may be used if a ventilator is not effective. °· Tracheostomy. This is a procedure that creates a hole in the neck to insert a breathing tube. °· Receiving fluids and medicines. °· Rocking the bed to help breathing. °Follow these instructions at home: °· Take over-the-counter and prescription medicines only as told by your health care provider. °· Return to normal activities as told by your health care provider. Ask your health care provider what activities are safe for you. °· Keep all follow-up visits as told by your health care provider. This is important. °How is this prevented? °Treating infections and medical conditions that may lead to acute respiratory failure can help prevent the condition from developing. °Contact a health care provider if: °· You have a fever. °· Your symptoms do not improve or they get worse. °Get help right away if: °· You are having trouble breathing. °· You lose consciousness. °· Your have cyanosis or turn blue. °· You develop a rapid heart rate. °· You are confused. °These symptoms may represent a serious problem that is an emergency. Do not wait to see if the symptoms will go away. Get medical help right away. Call your local emergency services (911 in the [place] Do not drive yourself to the hospital. °This information is not intended to replace advice given to you by your health care provider. Make sure you discuss any questions you have with your health care provider. °Document Released: 10/16/2013 Document Revised: 09/23/2017 Document Reviewed: 04/28/2016 °[employer]vier Patient Education © 2020 [employer] ° °

## 2019-10-06 NOTE — TOC Transition Note (Signed)
Transition of Care [facility]) - CM/SW Discharge Note   Patient Details  Name: Shelly Duran MRN: [id number] Date of Birth: 1940-12-25  [facility] CM/SW Contact:  Marshell Garfinkel, RN Phone Number: 10/06/2019, 2:45 PM   Clinical Narrative:    Spoke with patient's son and he agrees to transfer of patient to [facility] [facility] today. No other RNCM needs.    Final next level of care: [place] Barriers to Discharge: No Barriers Identified   Patient Goals and CMS Choice     Choice offered to / list presented to : Adult Children  Discharge Placement              Patient chooses bed at: [facility] Patient to be transferred to facility by: EMS Name of family member notified: Zola Runion son Patient and family notified of of transfer: 10/06/19  Discharge Plan and Services                                     Social Determinants of Health (SDOH) Interventions     Readmission Risk Interventions No flowsheet data found.

## 2019-10-06 NOTE — Progress Notes (Signed)
Son notified of discharge by SW. No rtc from voicemail to [place] regarding immunization status.

## 2019-10-06 NOTE — Progress Notes (Signed)
Pt ready for discharge to [facility] care/SNF. TC reports given to pt accepted. [place] [facility] called for routine transport with 02.

## 2019-10-06 NOTE — Discharge Summary (Signed)
Shelly Duran ZOX:[id number] DOB: April 20, 1941 DOA: 10/02/2019  PCP: Patient, No Pcp Per  Admit date: 10/02/2019 Discharge date: 10/06/2019  Admitted From: [facility] Disposition: [facility]  Recommendations for Outpatient Follow-up:  1. Follow up with PCP in 1 week 2. Please obtain BMP/CBC in one week 3. Please follow up on the following pending results:NONE  Home Health:NO    Discharge Condition:Stable CODE STATUS: DNR/DNI Diet recommendation: Heart Healthy / Carb Modified / Regular / Dysphagia  Brief/Interim Summary: Shelly Duran  is a 78 y.o. female with a known history of Alzheimer's dementia, who presented to the emergency room from [facility] with acute onset of respiratory distress with hypoxemia with a pulse oximetry dropped down to the 40s.  The patient was bagged, was given nebulized bronchodilator therapy as well as IV Solu-Medrol for suspected COPD exacerbation and was brought to the emergency room.  Upon presentation to the emergency room, blood pressure was 147/93 with a temperature of 97.5 and pulse oximetry was 96% on 2 L O2 by nasal cannula with respiratory  rate of 25.  Labs revealed a VBG with pH 7.23 and PCO2 of 73 bicarbonate of 30.6.  Lactic acid was 3.2.  CMP was remarkable for blood glucose of 326 with anion gap of 12 and CO2 24, AST 44.  BNP was 98 and high-sensitivity troponin I was 17 and later 42.   COVID-19 test is negative.  CT scan without contrast revealed atrophy with mild chronic small vessel white matter ischemic changes and remote left caudate infarct with no acute intracranial normalities.  Portable chest ray showed left basal opacity that may represent atelectasis or infiltrate with a small left pleural effusion, emphysema and cardiomegaly.  CT of the chest revealed no pulmonary embolism.  There was a left lobe 6 mm pulmonary nodule which radiology recommends a repeat chest CT in 6 to 12 months and if the nodule is still stable at that time to repeat  CT in the future at 18 to 24 months.  She will need to follow-up for this as outpatient. She was started for presumed pneumonia And Azithromycin.  She Was Started on Duo Nebs and IV Steroids with Transition to P.O.  She Was Weaned down to 2 L Nasal Cannula However She Desaturates in the 86% on Room Air at Rest.She Will Need Require 2 L Nasal Cannula at All Times.  She Also Did Receive Lasix 20 Mg IV x1 As Her BNP Was Elevated from Baseline and She Was Mildly Volume Overloaded since She Received IV Fluids Initially.  Blood cultures so far negative.  Repeat Covid for discharge was negative.    Discharge Diagnoses:  Active Problems:   COPD exacerbation (HCC)   DNR (do not resuscitate)    Discharge Instructions  Discharge Instructions    Call MD for:  temperature >100.4   Complete by: As directed    Diet - low sodium heart healthy   Complete by: As directed    Discharge instructions   Complete by: As directed    Needs to be evaluated by facility pcp or her pcp in one week   Increase activity slowly   Complete by: As directed      Allergies as of 10/06/2019   No Known Allergies     Medication List    STOP taking these medications   loperamide 2 MG capsule Commonly known as: IMODIUM   Milk of Magnesia 400 MG/5ML suspension Generic drug: magnesium hydroxide   sodium phosphate 7-19 GM/118ML  Enem     TAKE these medications   acetaminophen 325 MG tablet Commonly known as: TYLENOL Take 650 mg by mouth every 4 (four) hours as needed (for up to 24 hours).   amoxicillin-clavulanate 875-125 MG tablet Commonly known as: AUGMENTIN Take 1 tablet by mouth every 12 (twelve) hours. For two days   bismuth subsalicylate 299 BZ/16RC suspension Commonly known as: PEPTO BISMOL Take 15 mLs by mouth every 2 (two) hours as needed (for up to 6 doses in 24 hours).   chlorpheniramine-HYDROcodone 10-8 MG/5ML Suer Commonly known as: TUSSIONEX Take 5 mLs by mouth every 12 (twelve) hours as  needed for cough.   cholecalciferol 25 MCG (1000 UT) tablet Commonly known as: VITAMIN D3 Take 1,000 Units by mouth daily.   cyanocobalamin 1000 MCG/ML injection Commonly known as: (VITAMIN B-12) Inject 1,000 mcg into the muscle every 30 (thirty) days.   famotidine 20 MG tablet Commonly known as: PEPCID Take 1 tablet (20 mg total) by mouth daily. Start taking on: October 07, 2019   feeding supplement (ENSURE ENLIVE) Liqd Take 237 mLs by mouth 2 (two) times daily between meals.   guaiFENesin 600 MG 12 hr tablet Commonly known as: MUCINEX Take 1 tablet (600 mg total) by mouth 2 (two) times daily.   ipratropium-albuterol 0.5-2.5 (3) MG/3ML Soln Commonly known as: DUONEB Take 3 mLs by nebulization every 6 (six) hours as needed.   methimazole 5 MG tablet Commonly known as: TAPAZOLE Take 1 tablet (5 mg total) by mouth daily.   multivitamin with minerals Tabs tablet Take 1 tablet by mouth daily.   Mylanta 200-200-20 MG/5ML suspension Generic drug: alum & mag hydroxide-simeth Take 30 mLs by mouth once as needed for indigestion or heartburn. Take 30 ml by mouth as needed for 1 dose only   predniSONE 20 MG tablet Commonly known as: DELTASONE Take 2 tablets (40 mg total) by mouth daily with breakfast. Start taking on: October 07, 2019   predniSONE 20 MG tablet Commonly known as: DELTASONE Take 1 tablet (20 mg total) by mouth daily with breakfast. Start taking on: October 09, 2019   risperiDONE 1 MG tablet Commonly known as: RISPERDAL Take 1 tablet by mouth 2 (two) times daily. May repeat 1 hour after bedtime dose if needed for agitation.   Robitussin To Go Cough/Cold CF 5-10-100 MG/5ML Liqd Generic drug: Phenylephrine-DM-GG Take 10 mLs by mouth every 6 (six) hours as needed (for up to 48 hours).   traZODone 50 MG tablet Commonly known as: DESYREL Take 1 tablet by mouth at bedtime as needed.            Durable Medical Equipment  (From admission, onward)          Start     Ordered   10/06/19 1244  DME Oxygen  Once    Question Answer Comment  Length of Need 6 Months   Mode or (Route) Nasal cannula   Liters per Minute 2   Frequency Continuous (stationary and portable oxygen unit needed)   Oxygen delivery system Gas      10/06/19 1245          No Known Allergies  Consultations:  None   Procedures/Studies: CT Head Wo Contrast  Result Date: 10/02/2019 CLINICAL DATA:  78 year old female with altered mental status and altered level of consciousness. EXAM: CT HEAD WITHOUT CONTRAST TECHNIQUE: Contiguous axial images were obtained from the base of the skull through the vertex without intravenous contrast. COMPARISON:  None. FINDINGS: Brain: No evidence  of acute infarction, hemorrhage, hydrocephalus, extra-axial collection or mass lesion/mass effect. Atrophy, mild chronic small-vessel white matter ischemic changes remote LEFT caudate lacunar infarct noted. Vascular: No hyperdense vessel or unexpected calcification. Skull: Normal. Negative for fracture or focal lesion. Sinuses/Orbits: No acute finding. Other: None. IMPRESSION: 1. No evidence of acute intracranial abnormality. 2. Atrophy, mild chronic small-vessel white matter ischemic changes and remote LEFT caudate infarct. Electronically Signed   By: Harmon Pier M.D.   On: 10/02/2019 20:19   CT ANGIO CHEST PE W OR WO CONTRAST  Result Date: 10/03/2019 CLINICAL DATA:  Shortness of breath. Dementia patient with acute onset of respiratory distress and hypoxia. EXAM: CT ANGIOGRAPHY CHEST WITH CONTRAST TECHNIQUE: Multidetector CT imaging of the chest was performed using the standard protocol during bolus administration of intravenous contrast. Multiplanar CT image reconstructions and MIPs were obtained to evaluate the vascular anatomy. CONTRAST:  75mL OMNIPAQUE IOHEXOL 350 MG/ML SOLN COMPARISON:  Radiograph yesterday. Chest CT 10/28/2018 FINDINGS: Cardiovascular: There are no filling defects within the  pulmonary arteries to suggest pulmonary embolus. Atherosclerosis of the thoracic aorta. No aortic dissection or aneurysm. Descending thoracic aorta is tortuous. There are coronary artery calcifications. Mild cardiomegaly with right heart dilatation. Contrast refluxes into the hepatic veins and IVC. No pericardial effusion. Mediastinum/Nodes: No enlarged mediastinal or hilar lymph nodes. Patulous mid esophagus. Thyroid nodules which were previously evaluated by ultrasound. Lungs/Pleura: Moderate to advanced emphysema. Dependent atelectasis in both lower lobes. No pleural fluid. Minimal subpleural scarring in the right middle lobe. Improved central bronchial thickening from prior exam. There is a 6 x 6 mm nodule in the superior segment of the left lower lobe, series 6, image 42. This with likely present on prior exam but was previously obscured by adjacent atelectasis. There is dependent debris within the trachea at the thoracic inlet. Upper Abdomen: Contrast refluxes into the hepatic veins and IVC. Per abdominal aortic tortuosity. Musculoskeletal: Exaggerated thoracic kyphosis. Right anterior lower rib fractures are likely subacute with some surrounding callus formation. No acute osseous abnormality. Review of the MIP images confirms the above findings. IMPRESSION: 1. No pulmonary embolus. 2. Mild cardiomegaly with right heart dilatation and reflux of contrast into the hepatic veins and IVC, consistent with elevated right heart pressures. 3. Right anterior lower rib fractures are likely subacute with some surrounding callus formation. 4. Left lobe 6 mm pulmonary nodule, possibly present on exam 11 months ago but previously obscured. Non-contrast chest CT at 6-12 months is recommended. If the nodule is stable at time of repeat CT, then future CT at 18-24 months (from today's scan) is considered optional for low-risk patients, but is recommended for high-risk patients. This recommendation follows the consensus  statement: Guidelines for Management of Incidental Pulmonary Nodules Detected on CT Images: From the Fleischner Society 2017; Radiology 2017; 284:228-243. 5. Debris/mucus in the dependent trachea. 6. Emphysema. 7. Aortic atherosclerosis and coronary artery calcifications. Aortic Atherosclerosis (ICD10-I70.0) and Emphysema (ICD10-J43.9). Electronically Signed   By: Narda Rutherford M.D.   On: 10/03/2019 05:07   DG Chest Port 1 View  Result Date: 10/02/2019 CLINICAL DATA:  Respiratory distress. EXAM: PORTABLE CHEST 1 VIEW COMPARISON:  October 28, 2018 FINDINGS: The heart size remains enlarged. Emphysematous changes are noted bilaterally. The lungs are hyperexpanded. There is an opacity at the left lung base which may represent atelectasis or infiltrate in combination with a small left-sided pleural effusion. There is no pneumothorax. IMPRESSION: 1. Stable chest x-ray. 2. Left basilar opacity may represent atelectasis or infiltrate in combination with  a small left pleural effusion. 3. Emphysema. 4. Cardiomegaly. Electronically Signed   By: Katherine Mantle M.D.   On: 10/02/2019 19:47       Subjective: Patient seen and examined.  Eating breakfast.  On 2 L nasal cannula.  Denies any shortness of breath, chest pain, or any other symptoms.  Discharge Exam: Vitals:   10/06/19 0815 10/06/19 0900  BP:    Pulse: 82 80  Resp:    Temp:    SpO2: 90% 93%   Vitals:   10/06/19 0806 10/06/19 0812 10/06/19 0815 10/06/19 0900  BP: (!) 165/99     Pulse: 78 77 82 80  Resp: 18 20    Temp: 97.7 F (36.5 C)     TempSrc: Oral     SpO2: 93% (!) 86% 90% 93%  Weight:      Height:        General: Pt is alert, awake, not in acute distress Cardiovascular: RRR, S1/S2 +, no rubs, no gallops Respiratory: CTA bilaterally, no wheezing, no rhonchi Abdominal: Soft, NT, ND, bowel sounds + Extremities: no edema, no cyanosis    The results of significant diagnostics from this hospitalization (including imaging,  microbiology, ancillary and laboratory) are listed below for reference.     Microbiology: Recent Results (from the past 240 hour(s))  Blood Culture (routine x 2)     Status: None (Preliminary result)   Collection Time: 10/02/19  7:35 PM   Specimen: BLOOD  Result Value Ref Range Status   Specimen Description BLOOD BLOOD LEFT WRIST  Final   Special Requests   Final    BOTTLES DRAWN AEROBIC AND ANAEROBIC Blood Culture adequate volume   Culture   Final    NO GROWTH 4 DAYS Performed at [facility], [address] [place], [place] [address]    Report Status PENDING  Incomplete  Blood Culture (routine x 2)     Status: None (Preliminary result)   Collection Time: 10/02/19  7:35 PM   Specimen: BLOOD  Result Value Ref Range Status   Specimen Description BLOOD BLOOD RIGHT WRIST  Final   Special Requests   Final    BOTTLES DRAWN AEROBIC AND ANAEROBIC Blood Culture adequate volume   Culture   Final    NO GROWTH 4 DAYS Performed at [facility], [address], [place], [place] [address]    Report Status PENDING  Incomplete  Urine culture     Status: None   Collection Time: 10/02/19  7:35 PM   Specimen: In/Out Cath Urine  Result Value Ref Range Status   Specimen Description   Final    IN/OUT CATH URINE Performed at [facility], [address], [place], [place] [address]    Special Requests   Final    NONE Performed at [facility], [address] [place], [place] [address]    Culture   Final    NO GROWTH Performed at [facility], [address], [place], [place] [address]    Report Status 10/04/2019 FINAL  Final  Respiratory Panel by RT PCR (Flu A&B, Covid) - Nasopharyngeal Swab     Status: None   Collection Time: 10/02/19  8:44 PM   Specimen: Nasopharyngeal Swab  Result Value Ref Range Status   SARS Coronavirus 2 by RT PCR NEGATIVE NEGATIVE Final    Comment: (NOTE) SARS-CoV-2 target nucleic acids are NOT DETECTED. The  SARS-CoV-2 RNA is generally detectable in upper respiratoy specimens during the acute phase  of infection. The lowest concentration of SARS-CoV-2 viral copies this assay can detect is 131 copies/mL. A negative result does not preclude SARS-Cov-2 infection and should not be used as the sole basis for treatment or other patient management decisions. A negative result may occur with  improper specimen collection/handling, submission of specimen other than nasopharyngeal swab, presence of viral mutation(s) within the areas targeted by this assay, and inadequate number of viral copies (<131 copies/mL). A negative result must be combined with clinical observations, patient history, and epidemiological information. The expected result is Negative. Fact Sheet for Patients:  https://www.moore.com/https://www.fda.gov/media/142436/download Fact Sheet for Healthcare Providers:  https://www.young.biz/https://www.fda.gov/media/142435/download This test is not yet ap proved or cleared by the [place] FDA and  has been authorized for detection and/or diagnosis of SARS-CoV-2 by FDA under an Emergency Use Authorization (EUA). This EUA will remain  in effect (meaning this test can be used) for the duration of the COVID-19 declaration under Section 564(b)(1) of the Act, 21 U.S.C. section 360bbb-3(b)(1), unless the authorization is terminated or revoked sooner.    Influenza A by PCR NEGATIVE NEGATIVE Final   Influenza B by PCR NEGATIVE NEGATIVE Final    Comment: (NOTE) The Xpert Xpress SARS-CoV-2/FLU/RSV assay is intended as an aid in  the diagnosis of influenza from Nasopharyngeal swab specimens and  should not be used as a sole basis for treatment. Nasal washings and  aspirates are unacceptable for Xpert Xpress SARS-CoV-2/FLU/RSV  testing. Fact Sheet for Patients: https://www.moore.com/https://www.fda.gov/media/142436/download Fact Sheet for Healthcare Providers: https://www.young.biz/https://www.fda.gov/media/142435/download This test is not yet approved or cleared by the [place] FDA and  has been authorized for detection and/or diagnosis of SARS-CoV-2 by  FDA under an Emergency Use Authorization (EUA). This EUA will remain  in effect (meaning this test can be used) for the duration of the  Covid-19 declaration under Section 564(b)(1) of the Act, 21  U.S.C. section 360bbb-3(b)(1), unless the authorization is  terminated or revoked. Performed at [facility], [address] [place], [place] [address]   SARS CORONAVIRUS 2 (TAT 6-24 HRS) Nasopharyngeal     Status: None   Collection Time: 10/04/19  4:30 PM   Specimen: Nasopharyngeal  Result Value Ref Range Status   SARS Coronavirus 2 NEGATIVE NEGATIVE Final    Comment: (NOTE) SARS-CoV-2 target nucleic acids are NOT DETECTED. The SARS-CoV-2 RNA is generally detectable in upper and lower respiratory specimens during the acute phase of infection. Negative results do not preclude SARS-CoV-2 infection, do not rule out co-infections with other pathogens, and should not be used as the sole basis for treatment or other patient management decisions. Negative results must be combined with clinical observations, patient history, and epidemiological information. The expected result is Negative. Fact Sheet for Patients: HairSlick.nohttps://www.fda.gov/media/138098/download Fact Sheet for Healthcare Providers: quierodirigir.comhttps://www.fda.gov/media/138095/download This test is not yet approved or cleared by the [place] FDA and  has been authorized for detection and/or diagnosis of SARS-CoV-2 by FDA under an Emergency Use Authorization (EUA). This EUA will remain  in effect (meaning this test can be used) for the duration of the COVID-19 declaration under Section 56 4(b)(1) of the Act, 21 U.S.C. section 360bbb-3(b)(1), unless the authorization is terminated or revoked sooner. Performed at [facility], [address] [place], [place] [address]      Labs: BNP (last 3 results) Recent Labs    10/28/18 0307  10/02/19 1935 10/05/19 1024  BNP 125.0* 98.0 219.0*   Basic Metabolic Panel: Recent Labs  Lab 10/02/19 1935 10/03/19 0547  10/06/19 0447  NA 135 138 142  K 4.5 4.1 3.3*  CL 99 102 102  CO2 GLUCOSE 326* 149* 95  BUN 19 16 27*  CREATININE 1.01* 0.84 0.70  CALCIUM 8.1* 8.2* 8.3*   Liver Function Tests: Recent Labs  Lab 10/02/19 1935  AST 44*  ALT 27  ALKPHOS 130*  BILITOT 1.1  PROT 6.7  ALBUMIN 3.7   No results for input(s): LIPASE, AMYLASE in the last 168 hours. No results for input(s): AMMONIA in the last 168 hours. CBC: Recent Labs  Lab 10/02/19 1935 10/03/19 0547 10/04/19 0426 10/06/19 0447  WBC 21.6* 20.6* 16.6* 11.2*  NEUTROABS 18.7*  --  15.2*  --   HGB 13.6 12.1 11.1* 12.2  HCT 42.2 38.0 34.4* 36.3  MCV 95.0 97.2 96.6 91.2  PLT 401* 368 339 351   Cardiac Enzymes: No results for input(s): CKTOTAL, CKMB, CKMBINDEX, TROPONINI in the last 168 hours. BNP: Invalid input(s): POCBNP CBG: No results for input(s): GLUCAP in the last 168 hours. D-Dimer No results for input(s): DDIMER in the last 72 hours. Hgb A1c No results for input(s): HGBA1C in the last 72 hours. Lipid Profile No results for input(s): CHOL, HDL, LDLCALC, TRIG, CHOLHDL, LDLDIRECT in the last 72 hours. Thyroid function studies No results for input(s): TSH, T4TOTAL, T3FREE, THYROIDAB in the last 72 hours.  Invalid input(s): FREET3 Anemia work up No results for input(s): VITAMINB12, FOLATE, FERRITIN, TIBC, IRON, RETICCTPCT in the last 72 hours. Urinalysis    Component Value Date/Time   COLORURINE STRAW (A) 10/02/2019 1935   APPEARANCEUR CLEAR (A) 10/02/2019 1935   LABSPEC 1.011 10/02/2019 1935   PHURINE 6.0 10/02/2019 1935   GLUCOSEU >=500 (A) 10/02/2019 1935   HGBUR NEGATIVE 10/02/2019 1935   BILIRUBINUR NEGATIVE 10/02/2019 1935   KETONESUR NEGATIVE 10/02/2019 1935   PROTEINUR NEGATIVE 10/02/2019 1935   NITRITE NEGATIVE 10/02/2019 1935   LEUKOCYTESUR NEGATIVE 10/02/2019  1935   Sepsis Labs Invalid input(s): PROCALCITONIN,  WBC,  LACTICIDVEN Microbiology Recent Results (from the past 240 hour(s))  Blood Culture (routine x 2)     Status: None (Preliminary result)   Collection Time: 10/02/19  7:35 PM   Specimen: BLOOD  Result Value Ref Range Status   Specimen Description BLOOD BLOOD LEFT WRIST  Final   Special Requests   Final    BOTTLES DRAWN AEROBIC AND ANAEROBIC Blood Culture adequate volume   Culture   Final    NO GROWTH 4 DAYS Performed at [facility], [address] [place], [place] [address]    Report Status PENDING  Incomplete  Blood Culture (routine x 2)     Status: None (Preliminary result)   Collection Time: 10/02/19  7:35 PM   Specimen: BLOOD  Result Value Ref Range Status   Specimen Description BLOOD BLOOD RIGHT WRIST  Final   Special Requests   Final    BOTTLES DRAWN AEROBIC AND ANAEROBIC Blood Culture adequate volume   Culture   Final    NO GROWTH 4 DAYS Performed at [facility], [address], [place], [place] [address]    Report Status PENDING  Incomplete  Urine culture     Status: None   Collection Time: 10/02/19  7:35 PM   Specimen: In/Out Cath Urine  Result Value Ref Range Status   Specimen Description   Final    IN/OUT CATH URINE Performed at [facility], [address], [place], [place] [address]    Special Requests  Final    NONE Performed at [facility], [address] [place], [place] [address]    Culture   Final    NO GROWTH Performed at [facility], [address] [place], [place] [address]    Report Status 10/04/2019 FINAL  Final  Respiratory Panel by RT PCR (Flu A&B, Covid) - Nasopharyngeal Swab     Status: None   Collection Time: 10/02/19  8:44 PM   Specimen: Nasopharyngeal Swab  Result Value Ref Range Status   SARS Coronavirus 2 by RT PCR NEGATIVE NEGATIVE Final    Comment: (NOTE) SARS-CoV-2 target nucleic acids are NOT DETECTED. The SARS-CoV-2  RNA is generally detectable in upper respiratoy specimens during the acute phase of infection. The lowest concentration of SARS-CoV-2 viral copies this assay can detect is 131 copies/mL. A negative result does not preclude SARS-Cov-2 infection and should not be used as the sole basis for treatment or other patient management decisions. A negative result may occur with  improper specimen collection/handling, submission of specimen other than nasopharyngeal swab, presence of viral mutation(s) within the areas targeted by this assay, and inadequate number of viral copies (<131 copies/mL). A negative result must be combined with clinical observations, patient history, and epidemiological information. The expected result is Negative. Fact Sheet for Patients:  https://www.moore.com/ Fact Sheet for Healthcare Providers:  https://www.young.biz/ This test is not yet ap proved or cleared by the [place] FDA and  has been authorized for detection and/or diagnosis of SARS-CoV-2 by FDA under an Emergency Use Authorization (EUA). This EUA will remain  in effect (meaning this test can be used) for the duration of the COVID-19 declaration under Section 564(b)(1) of the Act, 21 U.S.C. section 360bbb-3(b)(1), unless the authorization is terminated or revoked sooner.    Influenza A by PCR NEGATIVE NEGATIVE Final   Influenza B by PCR NEGATIVE NEGATIVE Final    Comment: (NOTE) The Xpert Xpress SARS-CoV-2/FLU/RSV assay is intended as an aid in  the diagnosis of influenza from Nasopharyngeal swab specimens and  should not be used as a sole basis for treatment. Nasal washings and  aspirates are unacceptable for Xpert Xpress SARS-CoV-2/FLU/RSV  testing. Fact Sheet for Patients: https://www.moore.com/ Fact Sheet for Healthcare Providers: https://www.young.biz/ This test is not yet approved or cleared by the [place] FDA  and  has been authorized for detection and/or diagnosis of SARS-CoV-2 by  FDA under an Emergency Use Authorization (EUA). This EUA will remain  in effect (meaning this test can be used) for the duration of the  Covid-19 declaration under Section 564(b)(1) of the Act, 21  U.S.C. section 360bbb-3(b)(1), unless the authorization is  terminated or revoked. Performed at [facility], [address] [place], [place] [address]   SARS CORONAVIRUS 2 (TAT 6-24 HRS) Nasopharyngeal     Status: None   Collection Time: 10/04/19  4:30 PM   Specimen: Nasopharyngeal  Result Value Ref Range Status   SARS Coronavirus 2 NEGATIVE NEGATIVE Final    Comment: (NOTE) SARS-CoV-2 target nucleic acids are NOT DETECTED. The SARS-CoV-2 RNA is generally detectable in upper and lower respiratory specimens during the acute phase of infection. Negative results do not preclude SARS-CoV-2 infection, do not rule out co-infections with other pathogens, and should not be used as the sole basis for treatment or other patient management decisions. Negative results must be combined with clinical observations, patient history, and epidemiological information. The expected result is Negative. Fact Sheet for Patients: HairSlick.no Fact Sheet for Healthcare  Providers: quierodirigir.com This test is not yet approved or cleared by the [place] and  has been authorized for detection and/or diagnosis of SARS-CoV-2 by FDA under an Emergency Use Authorization (EUA). This EUA will remain  in effect (meaning this test can be used) for the duration of the COVID-19 declaration under Section 56 4(b)(1) of the Act, 21 U.S.C. section 360bbb-3(b)(1), unless the authorization is terminated or revoked sooner. Performed at [facility], [address], [place], [place] [address]    1. Severe COPD exacerbation possibly secondary to left basal pneumonia likely  with parapneumonic effusion.  At baseline We will switch IV antibiotics to Augmentin for 2 more days She will need to take prednisone 40 mg p.o. daily x2 days then 20 mg p.o. x3 days then stop Pepcid for GI prophylaxis while on steroids Will need 2 L nasal cannula continuously Follow-up with PCP next week   2. Acute hypoxic respiratory failure.Secondary to #1. O2 protocol will be followed. Unable to  wean off oxygen, requiring 2 L nasal cannula  3. Elevated D-dimer. CTA negative for PE  4. Hyperthyroidism. continue Tapazole TSH within normal limits  5. Dementia with behavioral changes.continue Risperdal  6.Pulmonary nodule- on CT chest Rec. Non-contrast chest CT at 6-12 months is recommended will need for follow-up as outpatient with close monitoring   7.  Hypokalemia-mild Likely due to Lasix given yesterday Replaced today Check BMP in 1 to 2 days  Time coordinating discharge: Over 30 minutes  SIGNED:   Lynn Ito, MD  [facility] 10/06/2019, 12:46 PM Pager   If 7PM-7AM, please contact night-coverage [email or web address] Password [email or web address]

## 2019-10-06 NOTE — Progress Notes (Signed)
Unable to wean pt to room air; pulse ox 93 % on @ 2l/[place].  IV Rocephin changed to po augmentin, Pt alert, quiet/confused at breakfast and fed self part of meal; lunch pt too sleepy to engage in eating. MOM given for constipation x 3 days with no results thus far. Turned and respositioned every 1-2 hours with pericare given.

## 2019-10-06 NOTE — NC FL2 (Signed)
[place] LEVEL OF CARE SCREENING TOOL     IDENTIFICATION  Patient Name: Shelly Duran Birthdate: [birth year] Sex: female Admission Date (Current Location): 10/02/2019  [place] and [place] Number:  Engineering geologist and Address:  [facility], [address], [place], [place] [address]      Provider Number: [id number]  Attending Physician Name and Address:  Nolberto Hanlon, MD  Relative Name and Phone Number:  Shanon Brow [phone]    Current Level of Care: Hospital Recommended Level of Care: [place] Prior Approval Number:    Date Approved/Denied:   PASRR Number: [id number]  Discharge Plan: SNF    Current Diagnoses: Patient Active Problem List   Diagnosis Date Noted  . Acquired hypothyroidism   . DNR (do not resuscitate) 10/03/2019  . Sepsis due to pneumonia ([place]) 10/28/2018  . Protein-calorie malnutrition, severe 09/12/2018  . COPD exacerbation ([place])   . Acute respiratory failure ([place])   . Multifocal pneumonia   . Palliative care encounter   . Sepsis ([place]) 09/10/2018  . 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 ELEVATED BP READING WITHOUT DX HYPERTENSION 03/10/2010    Orientation RESPIRATION BLADDER Height & Weight     Self, Time, Situation, Place  O2([place] 2L) External catheter Weight: 109 lb 12.8 oz (49.8 kg) Height:  5\' 2"  (157.5 cm)  BEHAVIORAL SYMPTOMS/MOOD NEUROLOGICAL BOWEL NUTRITION STATUS      Continent    AMBULATORY STATUS COMMUNICATION OF NEEDS Skin   Extensive Assist Verbally                         Personal Care Assistance Level of Assistance  Bathing, Feeding, Dressing Bathing Assistance: Limited assistance Feeding assistance: Limited assistance Dressing Assistance: Limited assistance     Functional Limitations Info             SPECIAL CARE FACTORS FREQUENCY  PT (By licensed PT), OT (By licensed OT)     PT Frequency: 5 days a week OT Frequency: 5 days a week             Contractures Contractures Info: Not present    Additional Factors Info  Code Status Code Status Info: DNR             Current Medications (10/06/2019):  This is the current hospital active medication list Current Facility-Administered Medications  Medication Dose Route Frequency Provider Last Rate Last Admin  . acetaminophen (TYLENOL) tablet 650 mg  650 mg Oral Q6H PRN Mansy, Jan A, MD       Or  . acetaminophen (TYLENOL) suppository 650 mg  650 mg Rectal Q6H PRN Mansy, Jan A, MD      . alum & mag hydroxide-simeth (MAALOX/MYLANTA) 200-200-20 MG/5ML suspension 30 mL  30 mL Oral Once PRN Mansy, Jan A, MD      . amoxicillin-clavulanate (AUGMENTIN) 875-125 MG per tablet 1 tablet  1 tablet Oral Q12H Nolberto Hanlon, MD   1 tablet at 10/06/19 1250  . bismuth subsalicylate (PEPTO BISMOL) 262 MG/15ML suspension 15 mL  15 mL Oral Q2H PRN Mansy, Jan A, MD      . chlorpheniramine-HYDROcodone (TUSSIONEX) 10-8 MG/5ML suspension 5 mL  5 mL Oral Q12H PRN Mansy, Jan A, MD      . cholecalciferol (VITAMIN D3) tablet 1,000 Units  1,000 Units Oral Daily Mansy, Arvella Merles, MD   1,000 Units at 10/06/19 0813  . cyanocobalamin ((VITAMIN B-12)) injection 1,000 mcg  1,000 mcg Intramuscular Q30 days  Mansy, Jan A, MD   1,000 mcg at 10/05/19 0931  . enoxaparin (LOVENOX) injection 40 mg  40 mg Subcutaneous Q24H Mansy, Jan A, MD   40 mg at 10/05/19 2238  . famotidine (PEPCID) tablet 20 mg  20 mg Oral Daily Lynn Ito, MD   20 mg at 10/06/19 0950  . feeding supplement (ENSURE ENLIVE) (ENSURE ENLIVE) liquid 237 mL  237 mL Oral BID BM Lynn Ito, MD   237 mL at 10/06/19 0810  . guaiFENesin (MUCINEX) 12 hr tablet 600 mg  600 mg Oral BID Mansy, Jan A, MD   600 mg at 10/06/19 0819  . ipratropium-albuterol (DUONEB) 0.5-2.5 (3) MG/3ML nebulizer solution 3 mL  3 mL Nebulization Q6H PRN Mansy, Jan A, MD      . ipratropium-albuterol (DUONEB) 0.5-2.5 (3) MG/3ML nebulizer solution 3 mL  3 mL Nebulization TID Mansy, Jan A, MD   3  mL at 10/06/19 0818  . magnesium hydroxide (MILK OF MAGNESIA) suspension 30 mL  30 mL Oral Once PRN Mansy, Jan A, MD   30 mL at 10/06/19 1004  . magnesium hydroxide (MILK OF MAGNESIA) suspension 30 mL  30 mL Oral Daily PRN Mansy, Jan A, MD      . methimazole (TAPAZOLE) tablet 5 mg  5 mg Oral Daily Mansy, Jan A, MD   5 mg at 10/06/19 7124  . multivitamin with minerals tablet 1 tablet  1 tablet Oral Daily Mansy, Jan A, MD   1 tablet at 10/06/19 0813  . ondansetron (ZOFRAN) tablet 4 mg  4 mg Oral Q6H PRN Mansy, Jan A, MD       Or  . ondansetron [facility]) injection 4 mg  4 mg Intravenous Q6H PRN Mansy, Jan A, MD      . potassium chloride SA (KLOR-CON) CR tablet 20 mEq  20 mEq Oral BID Lynn Ito, MD   20 mEq at 10/06/19 0814  . [START ON 10/07/2019] predniSONE (DELTASONE) tablet 40 mg  40 mg Oral Q breakfast Lynn Ito, MD       Followed by  . [START ON 10/09/2019] predniSONE (DELTASONE) tablet 20 mg  20 mg Oral Q breakfast Lynn Ito, MD      . risperiDONE (RISPERDAL) tablet 1 mg  1 mg Oral BID Mansy, Jan A, MD   1 mg at 10/06/19 0820  . sodium chloride flush (NS) 0.9 % injection 3 mL  3 mL Intravenous PRN Lynn Ito, MD      . sodium chloride flush (NS) 0.9 % injection 3 mL  3 mL Intravenous Q12H Lynn Ito, MD   3 mL at 10/06/19 0904  . sodium phosphate (FLEET) 7-19 GM/118ML enema 1 enema  1 enema Rectal PRN Mansy, Jan A, MD      . traZODone (DESYREL) tablet 50 mg  50 mg Oral QHS PRN Mansy, Vernetta Honey, MD         Discharge Medications: Please see discharge summary for a list of discharge medications.  Relevant Imaging Results:  Relevant Lab Results:   Additional Information #[id number]  Dominic Pea, LCSW

## 2019-10-07 LAB — CULTURE, BLOOD (ROUTINE X 2)
Culture: NO GROWTH
Culture: NO GROWTH
Special Requests: ADEQUATE
Special Requests: ADEQUATE

## 2019-10-12 ENCOUNTER — Inpatient Hospital Stay
Admission: EM | Admit: 2019-10-12 | Discharge: 2019-10-26 | DRG: 177 | Disposition: E | Payer: Medicare Other | Source: Skilled Nursing Facility | Attending: Hospitalist | Admitting: Hospitalist

## 2019-10-12 ENCOUNTER — Emergency Department: Payer: Medicare Other

## 2019-10-12 ENCOUNTER — Other Ambulatory Visit: Payer: Self-pay

## 2019-10-12 ENCOUNTER — Encounter: Payer: Self-pay | Admitting: Emergency Medicine

## 2019-10-12 DIAGNOSIS — J069 Acute upper respiratory infection, unspecified: Secondary | ICD-10-CM | POA: Diagnosis not present

## 2019-10-12 DIAGNOSIS — E440 Moderate protein-calorie malnutrition: Secondary | ICD-10-CM | POA: Diagnosis present

## 2019-10-12 DIAGNOSIS — U071 COVID-19: Secondary | ICD-10-CM | POA: Diagnosis present

## 2019-10-12 DIAGNOSIS — R0902 Hypoxemia: Secondary | ICD-10-CM | POA: Diagnosis present

## 2019-10-12 DIAGNOSIS — Z7189 Other specified counseling: Secondary | ICD-10-CM | POA: Diagnosis not present

## 2019-10-12 DIAGNOSIS — F0280 Dementia in other diseases classified elsewhere without behavioral disturbance: Secondary | ICD-10-CM | POA: Diagnosis present

## 2019-10-12 DIAGNOSIS — E0590 Thyrotoxicosis, unspecified without thyrotoxic crisis or storm: Secondary | ICD-10-CM | POA: Diagnosis present

## 2019-10-12 DIAGNOSIS — J449 Chronic obstructive pulmonary disease, unspecified: Secondary | ICD-10-CM | POA: Diagnosis present

## 2019-10-12 DIAGNOSIS — G309 Alzheimer's disease, unspecified: Secondary | ICD-10-CM | POA: Diagnosis present

## 2019-10-12 DIAGNOSIS — E039 Hypothyroidism, unspecified: Secondary | ICD-10-CM | POA: Diagnosis present

## 2019-10-12 DIAGNOSIS — J1289 Other viral pneumonia: Secondary | ICD-10-CM | POA: Diagnosis present

## 2019-10-12 DIAGNOSIS — I472 Ventricular tachycardia: Secondary | ICD-10-CM | POA: Diagnosis present

## 2019-10-12 DIAGNOSIS — Z681 Body mass index (BMI) 19 or less, adult: Secondary | ICD-10-CM

## 2019-10-12 DIAGNOSIS — F17200 Nicotine dependence, unspecified, uncomplicated: Secondary | ICD-10-CM | POA: Diagnosis present

## 2019-10-12 DIAGNOSIS — J440 Chronic obstructive pulmonary disease with acute lower respiratory infection: Secondary | ICD-10-CM | POA: Diagnosis present

## 2019-10-12 DIAGNOSIS — J9601 Acute respiratory failure with hypoxia: Secondary | ICD-10-CM | POA: Diagnosis present

## 2019-10-12 DIAGNOSIS — Z66 Do not resuscitate: Secondary | ICD-10-CM | POA: Diagnosis present

## 2019-10-12 DIAGNOSIS — G9341 Metabolic encephalopathy: Secondary | ICD-10-CM | POA: Diagnosis present

## 2019-10-12 DIAGNOSIS — Z515 Encounter for palliative care: Secondary | ICD-10-CM | POA: Diagnosis present

## 2019-10-12 DIAGNOSIS — R627 Adult failure to thrive: Secondary | ICD-10-CM | POA: Diagnosis present

## 2019-10-12 DIAGNOSIS — K219 Gastro-esophageal reflux disease without esophagitis: Secondary | ICD-10-CM | POA: Diagnosis not present

## 2019-10-12 LAB — COMPREHENSIVE METABOLIC PANEL
ALT: 30 U/L (ref 0–44)
AST: 40 U/L (ref 15–41)
Albumin: 2.9 g/dL — ABNORMAL LOW (ref 3.5–5.0)
Alkaline Phosphatase: 76 U/L (ref 38–126)
Anion gap: 14 (ref 5–15)
BUN: 20 mg/dL (ref 8–23)
CO2: 26 mmol/L (ref 22–32)
Calcium: 8.3 mg/dL — ABNORMAL LOW (ref 8.9–10.3)
Chloride: 100 mmol/L (ref 98–111)
Creatinine, Ser: 0.82 mg/dL (ref 0.44–1.00)
GFR calc Af Amer: >60 mL/min (ref >60)
GFR calc non Af Amer: >60 mL/min (ref >60)
Glucose, Bld: 143 mg/dL — ABNORMAL HIGH (ref 70–99)
Potassium: 4.1 mmol/L (ref 3.5–5.1)
Sodium: 140 mmol/L (ref 135–145)
Total Bilirubin: 0.5 mg/dL (ref 0.3–1.2)
Total Protein: 6.4 g/dL — ABNORMAL LOW (ref 6.5–8.1)

## 2019-10-12 LAB — CBC WITH DIFFERENTIAL/PLATELET
Abs Immature Granulocytes: 0.08 10*3/uL — ABNORMAL HIGH (ref 0.00–0.07)
Basophils Absolute: 0 10*3/uL (ref 0.0–0.1)
Basophils Relative: 0 %
Eosinophils Absolute: 0 10*3/uL (ref 0.0–0.5)
Eosinophils Relative: 0 %
HCT: 38.6 % (ref 36.0–46.0)
Hemoglobin: 12.4 g/dL (ref 12.0–15.0)
Immature Granulocytes: 1 %
Lymphocytes Relative: 3 %
Lymphs Abs: 0.3 10*3/uL — ABNORMAL LOW (ref 0.7–4.0)
MCH: 30.7 pg (ref 26.0–34.0)
MCHC: 32.1 g/dL (ref 30.0–36.0)
MCV: 95.5 fL (ref 80.0–100.0)
Monocytes Absolute: 0.8 10*3/uL (ref 0.1–1.0)
Monocytes Relative: 6 %
Neutro Abs: 10.6 10*3/uL — ABNORMAL HIGH (ref 1.7–7.7)
Neutrophils Relative %: 90 %
Platelets: 421 10*3/uL — ABNORMAL HIGH (ref 150–400)
RBC: 4.04 MIL/uL (ref 3.87–5.11)
RDW: 14 % (ref 11.5–15.5)
WBC: 11.8 10*3/uL — ABNORMAL HIGH (ref 4.0–10.5)
nRBC: 0 % (ref 0.0–0.2)

## 2019-10-12 LAB — LACTATE DEHYDROGENASE: LDH: 247 U/L — ABNORMAL HIGH (ref 98–192)

## 2019-10-12 LAB — C-REACTIVE PROTEIN: CRP: 6.8 mg/dL — ABNORMAL HIGH (ref <1.0)

## 2019-10-12 LAB — PROCALCITONIN: Procalcitonin: <0.1 ng/mL

## 2019-10-12 LAB — FIBRINOGEN: Fibrinogen: 740 mg/dL — ABNORMAL HIGH (ref 210–475)

## 2019-10-12 LAB — LACTIC ACID, PLASMA
Lactic Acid, Venous: 1.3 mmol/L (ref 0.5–1.9)
Lactic Acid, Venous: 1.4 mmol/L (ref 0.5–1.9)

## 2019-10-12 LAB — FERRITIN: Ferritin: 325 ng/mL — ABNORMAL HIGH (ref 11–307)

## 2019-10-12 LAB — SARS CORONAVIRUS 2 (TAT 6-24 HRS): SARS Coronavirus 2: POSITIVE — AB

## 2019-10-12 LAB — FIBRIN DERIVATIVES D-DIMER (ARMC ONLY): Fibrin derivatives D-dimer (ARMC): 1225.95 ng/mL (FEU) — ABNORMAL HIGH (ref 0.00–499.00)

## 2019-10-12 LAB — TRIGLYCERIDES: Triglycerides: 77 mg/dL (ref <150)

## 2019-10-12 MED ORDER — RISPERIDONE 1 MG PO TABS
1.0000 mg | ORAL_TABLET | Freq: Two times a day (BID) | ORAL | Status: DC
  Administered 2019-10-13 – 2019-10-16 (×8): 1 mg via ORAL
  Filled 2019-10-12 (×11): qty 1

## 2019-10-12 MED ORDER — IPRATROPIUM BROMIDE HFA 17 MCG/ACT IN AERS
2.0000 | INHALATION_SPRAY | RESPIRATORY_TRACT | Status: DC
  Administered 2019-10-12 – 2019-10-14 (×5): 2 via RESPIRATORY_TRACT
  Filled 2019-10-12 (×3): qty 12.9

## 2019-10-12 MED ORDER — DM-GUAIFENESIN ER 30-600 MG PO TB12: 1.0000 | ORAL_TABLET | Freq: Two times a day (BID) | ORAL | Status: DC

## 2019-10-12 MED ORDER — ALUM & MAG HYDROXIDE-SIMETH 200-200-20 MG/5ML PO SUSP: 30.0000 mL | Freq: Every day | ORAL | Status: DC | PRN

## 2019-10-12 MED ORDER — ASCORBIC ACID 500 MG PO TABS
500.0000 mg | ORAL_TABLET | Freq: Every day | ORAL | Status: DC
  Administered 2019-10-12 – 2019-10-16 (×5): 500 mg via ORAL
  Filled 2019-10-12 (×6): qty 1

## 2019-10-12 MED ORDER — ONDANSETRON HCL 4 MG/2ML IJ SOLN: 4.0000 mg | Freq: Three times a day (TID) | INTRAMUSCULAR | Status: DC | PRN

## 2019-10-12 MED ORDER — BISMUTH SUBSALICYLATE 262 MG/15ML PO SUSP
5.0000 mL | Freq: Two times a day (BID) | ORAL | Status: DC | PRN
  Filled 2019-10-12: qty 118

## 2019-10-12 MED ORDER — SODIUM CHLORIDE 0.9 % IV SOLN
200.0000 mg | Freq: Once | INTRAVENOUS | Status: AC
  Administered 2019-10-12: 200 mg via INTRAVENOUS
  Filled 2019-10-12: qty 200

## 2019-10-12 MED ORDER — VITAMIN D 25 MCG (1000 UNIT) PO TABS
1000.0000 [IU] | ORAL_TABLET | Freq: Every day | ORAL | Status: DC
  Administered 2019-10-13 – 2019-10-16 (×4): 1000 [IU] via ORAL
  Filled 2019-10-12 (×4): qty 1

## 2019-10-12 MED ORDER — SODIUM CHLORIDE 0.9 % IV SOLN
100.0000 mg | Freq: Every day | INTRAVENOUS | Status: AC
  Administered 2019-10-13 – 2019-10-16 (×4): 100 mg via INTRAVENOUS
  Filled 2019-10-12: qty 100
  Filled 2019-10-12: qty 20
  Filled 2019-10-12 (×2): qty 100

## 2019-10-12 MED ORDER — GUAIFENESIN-DM 100-10 MG/5ML PO SYRP
5.0000 mL | ORAL_SOLUTION | Freq: Two times a day (BID) | ORAL | Status: DC
  Administered 2019-10-13 – 2019-10-16 (×8): 5 mL via ORAL
  Filled 2019-10-12 (×10): qty 5

## 2019-10-12 MED ORDER — ENOXAPARIN SODIUM 40 MG/0.4ML ~~LOC~~ SOLN
40.0000 mg | SUBCUTANEOUS | Status: DC
  Administered 2019-10-13 – 2019-10-17 (×5): 40 mg via SUBCUTANEOUS
  Filled 2019-10-12 (×5): qty 0.4

## 2019-10-12 MED ORDER — AEROCHAMBER PLUS FLO-VU LARGE MISC
1.0000 | Freq: Once | Status: AC
  Administered 2019-10-12: 1
  Filled 2019-10-12 (×2): qty 1

## 2019-10-12 MED ORDER — SODIUM CHLORIDE 0.9 % IV SOLN: 100.0000 mg | Freq: Every day | INTRAVENOUS | Status: DC

## 2019-10-12 MED ORDER — METHYLPREDNISOLONE SODIUM SUCC 40 MG IJ SOLR
20.0000 mg | Freq: Two times a day (BID) | INTRAMUSCULAR | Status: DC
  Administered 2019-10-12 – 2019-10-14 (×5): 20 mg via INTRAVENOUS
  Filled 2019-10-12 (×5): qty 1

## 2019-10-12 MED ORDER — ZINC SULFATE 220 (50 ZN) MG PO CAPS
220.0000 mg | ORAL_CAPSULE | Freq: Every day | ORAL | Status: DC
  Administered 2019-10-14 – 2019-10-16 (×4): 220 mg via ORAL
  Filled 2019-10-12 (×8): qty 1

## 2019-10-12 MED ORDER — ENSURE ENLIVE PO LIQD
237.0000 mL | Freq: Two times a day (BID) | ORAL | Status: DC
  Administered 2019-10-13 – 2019-10-16 (×6): 237 mL via ORAL

## 2019-10-12 MED ORDER — HYDROCOD POLST-CPM POLST ER 10-8 MG/5ML PO SUER: 5.0000 mL | Freq: Two times a day (BID) | ORAL | Status: DC | PRN

## 2019-10-12 MED ORDER — ALBUTEROL SULFATE HFA 108 (90 BASE) MCG/ACT IN AERS
2.0000 | INHALATION_SPRAY | RESPIRATORY_TRACT | Status: DC | PRN
  Administered 2019-10-12 – 2019-10-14 (×5): 2 via RESPIRATORY_TRACT
  Filled 2019-10-12 (×2): qty 6.7

## 2019-10-12 MED ORDER — ADULT MULTIVITAMIN W/MINERALS CH
1.0000 | ORAL_TABLET | Freq: Every day | ORAL | Status: DC
  Administered 2019-10-13 – 2019-10-16 (×4): 1 via ORAL
  Filled 2019-10-12 (×4): qty 1

## 2019-10-12 MED ORDER — TRAZODONE HCL 50 MG PO TABS: 50.0000 mg | ORAL_TABLET | Freq: Every evening | ORAL | Status: DC | PRN

## 2019-10-12 MED ORDER — METHIMAZOLE 5 MG PO TABS
5.0000 mg | ORAL_TABLET | Freq: Every day | ORAL | Status: DC
  Administered 2019-10-13 – 2019-10-16 (×4): 5 mg via ORAL
  Filled 2019-10-12 (×5): qty 1

## 2019-10-12 MED ORDER — ACETAMINOPHEN 325 MG PO TABS: 650.0000 mg | ORAL_TABLET | Freq: Four times a day (QID) | ORAL | Status: DC | PRN

## 2019-10-12 NOTE — ED Notes (Signed)
Pt desaturated to 84% on 6L [place]. Pt placed on a NRB by this RN- pt sat 93% at this time. Assigned RN Neol notified.

## 2019-10-12 NOTE — ED Notes (Signed)
This RN removed NRB to get baseline O2 saturation. Pt O2 saturation decreased to 87% on RA. Gradually increased O2 to 6L. Pt currently sat 94% on 6L

## 2019-10-12 NOTE — ED Provider Notes (Signed)
[facility] Emergency Department Provider Note  Time seen: 11:01 AM  I have reviewed the triage vital signs and the nursing notes.   HISTORY  Chief Complaint Respiratory Distress   HPI Shelly Duran is a 78 y.o. female with a past medical history of Alzheimer's, COPD, presents to the emergency department for hypoxia.  According to nursing home staff patient tested positive for Covid Tuesday, 10/09/2019, found today to be hypoxic on her 2 L of oxygen, EMS states patient required nonrebreather to maintain sats in the 90s and they brought the patient to the emergency department.  Patient was discharged from the hospital 10/06/2019 after admission for respiratory failure thought to be due to possible pneumonia for COPD exacerbation at that time had 2 negative Covid tests.  Here patient is demented unable to provide any history, does not respond to questioning or follow commands.  Patient currently maintaining sats around 90 to 92% on 6 L of oxygen via nasal cannula.  Past Medical History:  Diagnosis Date  . Alzheimer's dementia (HCC)   . Dementia [facility](HCC)     Patient Active Problem List   Diagnosis Date Noted  . Acquired hypothyroidism   . DNR (do not resuscitate) 10/03/2019  . Sepsis due to pneumonia (HCC) 10/28/2018  . Protein-calorie malnutrition, severe 09/12/2018  . COPD exacerbation (HCC)   . Acute respiratory failure (HCC)   . Multifocal pneumonia   . Palliative care encounter   . Sepsis (HCC) 09/10/2018  . 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 ELEVATED BP READING WITHOUT DX HYPERTENSION 03/10/2010    History reviewed. No pertinent surgical history.  Prior to Admission medications   Medication Sig Start Date End Date Taking? Authorizing Provider  acetaminophen (TYLENOL) 325 MG tablet Take 650 mg by mouth every 4 (four) hours as needed (for up to 24 hours).    [provider]  alum & mag hydroxide-simeth (MYLANTA) 200-200-20 MG/5ML suspension  Take 30 mLs by mouth once as needed for indigestion or heartburn. Take 30 ml by mouth as needed for 1 dose only    [provider]  amoxicillin-clavulanate (AUGMENTIN) 875-125 MG tablet Take 1 tablet by mouth every 12 (twelve) hours. For two days 10/06/19   Lynn ItoAmery, Sahar, MD  bismuth subsalicylate (PEPTO BISMOL) 262 MG/15ML suspension Take 15 mLs by mouth every 2 (two) hours as needed (for up to 6 doses in 24 hours).    [provider]  chlorpheniramine-HYDROcodone (TUSSIONEX) 10-8 MG/5ML SUER Take 5 mLs by mouth every 12 (twelve) hours as needed for cough. 10/06/19   Lynn ItoAmery, Sahar, MD  cholecalciferol (VITAMIN D3) 25 MCG (1000 UT) tablet Take 1,000 Units by mouth daily.    [provider]  cyanocobalamin (,VITAMIN B-12,) 1000 MCG/ML injection Inject 1,000 mcg into the muscle every 30 (thirty) days.    [provider]  famotidine (PEPCID) 20 MG tablet Take 1 tablet (20 mg total) by mouth daily. 10/07/19   Lynn ItoAmery, Sahar, MD  feeding supplement, ENSURE ENLIVE, (ENSURE ENLIVE) LIQD Take 237 mLs by mouth 2 (two) times daily between meals. 10/06/19   Lynn ItoAmery, Sahar, MD  guaiFENesin (MUCINEX) 600 MG 12 hr tablet Take 1 tablet (600 mg total) by mouth 2 (two) times daily. 10/06/19   Lynn ItoAmery, Sahar, MD  ipratropium-albuterol (DUONEB) 0.5-2.5 (3) MG/3ML SOLN Take 3 mLs by nebulization every 6 (six) hours as needed. 09/14/18   Enid BaasKalisetti, Radhika, MD  methimazole (TAPAZOLE) 5 MG tablet Take 1 tablet (5 mg total) by mouth daily. 09/14/18  Gladstone Lighter, MD  Multiple Vitamin (MULTIVITAMIN WITH MINERALS) TABS tablet Take 1 tablet by mouth daily. 11/02/18   Epifanio Lesches, MD  Phenylephrine-DM-GG (ROBITUSSIN TO GO COUGH/COLD CF) 5-10-100 MG/5ML LIQD Take 10 mLs by mouth every 6 (six) hours as needed (for up to 48 hours).    [provider]  predniSONE (DELTASONE) 20 MG tablet Take 2 tablets (40 mg total) by mouth daily with breakfast. 10/07/19   Nolberto Hanlon, MD   predniSONE (DELTASONE) 20 MG tablet Take 1 tablet (20 mg total) by mouth daily with breakfast. 10/09/19   Nolberto Hanlon, MD  risperiDONE (RISPERDAL) 1 MG tablet Take 1 tablet by mouth 2 (two) times daily. May repeat 1 hour after bedtime dose if needed for agitation. 08/18/18   [provider]  traZODone (DESYREL) 50 MG tablet Take 1 tablet by mouth at bedtime as needed. 09/28/19   [provider]    No Known Allergies  Family History  Family history unknown: Yes    Social History Social History   Tobacco Use  . Smoking status: Current Every Day Smoker  . Smokeless tobacco: Never Used  Substance Use Topics  . Alcohol use: Never  . Drug use: Never    Review of Systems Unable to obtain a review of systems secondary to baseline dementia  ____________________________________________   PHYSICAL EXAM:  VITAL SIGNS: ED Triage Vitals  Enc Vitals Group     BP 2019/10/24 1044 125/67     Pulse Rate 10-24-19 1044 92     Resp 2019/10/24 1044 17     Temp 24-Oct-2019 1044 99 F (37.2 C)     Temp Source 2019/10/24 1044 Axillary     SpO2 2019/10/24 1044 94 %     Weight October 24, 2019 1042 108 lb 0.4 oz (49 kg)     Height 10/24/2019 1042 5\' 2"  (1.575 m)     Head Circumference --      Peak Flow --      Pain Score --      Pain Loc --      Pain Edu? --      Excl. in [place]? --    Constitutional: Patient is somnolent, will briefly awaken with physical stimuli, does not answer questions or follow commands. Eyes: Normal exam ENT      Head: Normocephalic and atraumatic.      Mouth/Throat: Dry appearing mucous membranes. Cardiovascular: Normal rate, regular rhythm. No murmurs, rubs, or gallops. Respiratory: Normal respiratory effort without tachypnea nor retractions. Breath sounds are clear  Gastrointestinal: Soft and nontender. No distention.  Musculoskeletal: Nontender with normal range of motion in all extremities.  Neurologic: Does not speak or answer questions will occasionally open  eyes.  Unable to obtain an adequate neurologic exam. Skin:  Skin is warm, dry  Psychiatric: Largely nonresponsive, but no distress. ____________________________________________    EKG  EKG viewed and interpreted by myself shows a normal sinus rhythm 87 bpm with a slightly widened QRS, normal axis, normal intervals nonspecific ST changes.  ____________________________________________    RADIOLOGY  Chest x-ray shows new opacity in the left base.  ____________________________________________   INITIAL IMPRESSION / ASSESSMENT AND PLAN / ED COURSE  Pertinent labs & imaging results that were available during my care of the patient were reviewed by me and considered in my medical decision making (see chart for details).   Patient presents to the emergency department for hypoxia worsening shortness of breath from her skilled nursing facility.  Discussed with skilled nursing facility patient  tested + 10/09/2019.  They are sending over confirmatory results.  Given the patient's hypoxia, currently satting 92 to 94% on 6 L of oxygen patient will require admission to the hospitalist service.  Patient's labs show generalized increased inflammatory markers, overall consistent with Covid infection.  Ambria Mayfield was evaluated in Emergency Department on 11/03/2019 for the symptoms described in the history of present illness. She was evaluated in the context of the global COVID-19 pandemic, which necessitated consideration that the patient might be at risk for infection with the SARS-CoV-2 virus that causes COVID-19. Institutional protocols and algorithms that pertain to the evaluation of patients at risk for COVID-19 are in a state of rapid change based on information released by regulatory bodies including the CDC and federal and state organizations. These policies and algorithms were followed during the patient's care in the ED.  ____________________________________________   FINAL CLINICAL  IMPRESSION(S) / ED DIAGNOSES  COVID-19 Hypoxia   Minna Antis, MD 2019/11/03 1301

## 2019-10-12 NOTE — H&P (Signed)
History and Physical    Shelly Duran NGE:[id number] DOB: 08-13-1941 DOA: 09/27/2019  Referring MD/NP/PA:   PCP: Patient, No Pcp Per   Patient coming from:  The patient is coming from SNF.  At baseline, pt is dependent for most of ADL.        Chief Complaint: SOB  HPI: Shelly Duran is a 78 y.o. female with medical history significant of dementia, hyperthyroidism, COPD, GERD, tobacco abuse, who presents with shortness of breath.  Patient has dementia and is unable to provide accurate medical history, therefore, most of the history is obtained by discussing the case with ED physician, per EMS report, and with the nursing staff.   Per report, pt had positive Covid 19 test on 12/15. She has worsening SOB and cough. Found today to be hypoxic on her 2 L of oxygen, EMS states patient required nonrebreather to maintain sats in the 90s and they brought the patient to the emergency department. In ED, she has oxygen saturation 92-94% on 6 L oxygen. No active nausea, vomiting, diarrhea noted. She moves all extremities.  ED Course: pt was found to have WBC 11.8, lactic acid 1.3, 1.4, pending repeat COVID-19 test, electrolytes renal function okay, temperature 99, blood pressure 125/67, heart rate 92. CXR showed new opacification over the left base/retrocardiac region.pt is admitted to MedSurg bed as inpatient.  Review of Systems: Could not be reviewed due to dementia  Allergy: No Known Allergies  Past Medical History:  Diagnosis Date  . Alzheimer's dementia (HCC)   . Dementia (HCC)     History reviewed. No pertinent surgical history. Could not be reviewed due to dementia  Social History:  reports that she has been smoking. She has never used smokeless tobacco. She reports that she does not drink alcohol or use drugs.  Family History:  Could not be reviewed due to dementia Family History  Family history unknown: Yes     Prior to Admission medications   Medication Sig Start Date End Date  Taking? Authorizing Provider  acetaminophen (TYLENOL) 325 MG tablet Take 650 mg by mouth every 4 (four) hours as needed (for up to 24 hours).    [provider]  alum & mag hydroxide-simeth (MYLANTA) 200-200-20 MG/5ML suspension Take 30 mLs by mouth once as needed for indigestion or heartburn. Take 30 ml by mouth as needed for 1 dose only    [provider]  amoxicillin-clavulanate (AUGMENTIN) 875-125 MG tablet Take 1 tablet by mouth every 12 (twelve) hours. For two days 10/06/19   Lynn Ito, MD  bismuth subsalicylate (PEPTO BISMOL) 262 MG/15ML suspension Take 15 mLs by mouth every 2 (two) hours as needed (for up to 6 doses in 24 hours).    [provider]  chlorpheniramine-HYDROcodone (TUSSIONEX) 10-8 MG/5ML SUER Take 5 mLs by mouth every 12 (twelve) hours as needed for cough. 10/06/19   Lynn Ito, MD  cholecalciferol (VITAMIN D3) 25 MCG (1000 UT) tablet Take 1,000 Units by mouth daily.    [provider]  cyanocobalamin (,VITAMIN B-12,) 1000 MCG/ML injection Inject 1,000 mcg into the muscle every 30 (thirty) days.    [provider]  famotidine (PEPCID) 20 MG tablet Take 1 tablet (20 mg total) by mouth daily. 10/07/19   Lynn Ito, MD  feeding supplement, ENSURE ENLIVE, (ENSURE ENLIVE) LIQD Take 237 mLs by mouth 2 (two) times daily between meals. 10/06/19   Lynn Ito, MD  guaiFENesin (MUCINEX) 600 MG 12 hr tablet Take 1 tablet (600 mg total) by mouth  2 (two) times daily. 10/06/19   Lynn Ito, MD  ipratropium-albuterol (DUONEB) 0.5-2.5 (3) MG/3ML SOLN Take 3 mLs by nebulization every 6 (six) hours as needed. 09/14/18   Enid Baas, MD  methimazole (TAPAZOLE) 5 MG tablet Take 1 tablet (5 mg total) by mouth daily. 09/14/18   Enid Baas, MD  Multiple Vitamin (MULTIVITAMIN WITH MINERALS) TABS tablet Take 1 tablet by mouth daily. 11/02/18   Katha Hamming, MD  Phenylephrine-DM-GG (ROBITUSSIN TO GO COUGH/COLD CF) 5-10-100 MG/5ML  LIQD Take 10 mLs by mouth every 6 (six) hours as needed (for up to 48 hours).    [provider]  predniSONE (DELTASONE) 20 MG tablet Take 2 tablets (40 mg total) by mouth daily with breakfast. 10/07/19   Lynn Ito, MD  predniSONE (DELTASONE) 20 MG tablet Take 1 tablet (20 mg total) by mouth daily with breakfast. 10/09/19   Lynn Ito, MD  risperiDONE (RISPERDAL) 1 MG tablet Take 1 tablet by mouth 2 (two) times daily. May repeat 1 hour after bedtime dose if needed for agitation. 08/18/18   [provider]  traZODone (DESYREL) 50 MG tablet Take 1 tablet by mouth at bedtime as needed. 09/28/19   [provider]    Physical Exam: Vitals:   11-02-2019 1330 Nov 02, 2019 1400 11-02-19 1430 2019-11-02 1600  BP: 103/68 104/71 97/64 109/89  Pulse:  76 80 77  Resp: 15 17 (!) 23 (!) 22  Temp:      TempSrc:      SpO2:  99% 94% 100%  Weight:      Height:       General: Not in acute distress HEENT:       Eyes: PERRL, EOMI, no scleral icterus.       ENT: No discharge from the ears and nose, no pharynx injection, no tonsillar enlargement.        Neck: No JVD, no bruit, no mass felt. Heme: No neck lymph node enlargement. Cardiac: S1/S2, RRR, No murmurs, No gallops or rubs. Respiratory: No rales, wheezing, rhonchi or rubs. GI: Soft, nondistended, nontender, no rebound pain, no organomegaly, BS present. GU: No hematuria Ext: No pitting leg edema bilaterally. 2+DP/PT pulse bilaterally. Musculoskeletal: No joint deformities, No joint redness or warmth, no limitation of ROM in spin. Skin: No rashes.  Neuro: Alert, not oriented X3, cranial nerves II-XII grossly intact, moves all extremities Babinski's sign. Normal finger to nose test. Psych: Patient is not psychotic.  Labs on Admission: I have personally reviewed following labs and imaging studies  CBC: Recent Labs  Lab 10/06/19 0447 Nov 02, 2019 1153  WBC 11.2* 11.8*  NEUTROABS  --  10.6*  HGB 12.2 12.4  HCT 36.3 38.6  MCV  91.2 95.5  PLT 351 421*   Basic Metabolic Panel: Recent Labs  Lab 10/06/19 0447 11/02/19 1153  NA 142 140  K 3.3* 4.1  CL 102 100  CO2 30 26  GLUCOSE 95 143*  BUN 27* 20  CREATININE 0.70 0.82  CALCIUM 8.3* 8.3*   GFR: Estimated Creatinine Clearance: 45.1 mL/min (by C-G formula based on SCr of 0.82 mg/dL). Liver Function Tests: Recent Labs  Lab 11-02-19 1153  AST 40  ALT 30  ALKPHOS 76  BILITOT 0.5  PROT 6.4*  ALBUMIN 2.9*   No results for input(s): LIPASE, AMYLASE in the last 168 hours. No results for input(s): AMMONIA in the last 168 hours. Coagulation Profile: No results for input(s): INR, PROTIME in the last 168 hours. Cardiac Enzymes: No results for input(s): CKTOTAL, CKMB,  CKMBINDEX, TROPONINI in the last 168 hours. BNP (last 3 results) No results for input(s): PROBNP in the last 8760 hours. HbA1C: No results for input(s): HGBA1C in the last 72 hours. CBG: No results for input(s): GLUCAP in the last 168 hours. Lipid Profile: Recent Labs    10/04/2019 1153  TRIG 77   Thyroid Function Tests: No results for input(s): TSH, T4TOTAL, FREET4, T3FREE, THYROIDAB in the last 72 hours. Anemia Panel: Recent Labs    10/02/2019 1153  FERRITIN 325*   Urine analysis:    Component Value Date/Time   COLORURINE STRAW (A) 10/02/2019 1935   APPEARANCEUR CLEAR (A) 10/02/2019 1935   LABSPEC 1.011 10/02/2019 1935   PHURINE 6.0 10/02/2019 1935   GLUCOSEU >=500 (A) 10/02/2019 1935   HGBUR NEGATIVE 10/02/2019 1935   BILIRUBINUR NEGATIVE 10/02/2019 [place] NEGATIVE 10/02/2019 1935   PROTEINUR NEGATIVE 10/02/2019 1935   NITRITE NEGATIVE 10/02/2019 1935   LEUKOCYTESUR NEGATIVE 10/02/2019 1935   Sepsis Labs: @LABRCNTIP (procalcitonin:4,lacticidven:4) ) Recent Results (from the past 240 hour(s))  Blood Culture (routine x 2)     Status: None   Collection Time: 10/02/19  7:35 PM   Specimen: BLOOD  Result Value Ref Range Status   Specimen Description BLOOD BLOOD  LEFT WRIST  Final   Special Requests   Final    BOTTLES DRAWN AEROBIC AND ANAEROBIC Blood Culture adequate volume   Culture   Final    NO GROWTH 5 DAYS Performed at [facility], [address], [place], [place] [address]    Report Status 10/07/2019 FINAL  Final  Blood Culture (routine x 2)     Status: None   Collection Time: 10/02/19  7:35 PM   Specimen: BLOOD  Result Value Ref Range Status   Specimen Description BLOOD BLOOD RIGHT WRIST  Final   Special Requests   Final    BOTTLES DRAWN AEROBIC AND ANAEROBIC Blood Culture adequate volume   Culture   Final    NO GROWTH 5 DAYS Performed at [facility], [address] [place], [place] [address]    Report Status 10/07/2019 FINAL  Final  Urine culture     Status: None   Collection Time: 10/02/19  7:35 PM   Specimen: In/Out Cath Urine  Result Value Ref Range Status   Specimen Description   Final    IN/OUT CATH URINE Performed at [facility], [address] [place], [place] [address]    Special Requests   Final    NONE Performed at [facility], [address] [place],  [address]    Culture   Final    NO GROWTH Performed at [facility], [place] [address], [place],  [address]    Report Status 10/04/2019 FINAL  Final  Respiratory Panel by RT PCR (Flu A&B, Covid) - Nasopharyngeal Swab     Status: None   Collection Time: 10/02/19  8:44 PM   Specimen: Nasopharyngeal Swab  Result Value Ref Range Status   SARS Coronavirus 2 by RT PCR NEGATIVE NEGATIVE Final    Comment: (NOTE) SARS-CoV-2 target nucleic acids are NOT DETECTED. The SARS-CoV-2 RNA is generally detectable in upper respiratoy specimens during the acute phase of infection. The lowest concentration of SARS-CoV-2 viral copies this assay can detect is 131 copies/mL. A negative result does not preclude SARS-Cov-2 infection and should not be used as the sole basis for treatment or other patient management  decisions. A negative result may occur with  improper  specimen collection/handling, submission of specimen other than nasopharyngeal swab, presence of viral mutation(s) within the areas targeted by this assay, and inadequate number of viral copies (<131 copies/mL). A negative result must be combined with clinical observations, patient history, and epidemiological information. The expected result is Negative. Fact Sheet for Patients:  https://www.moore.com/ Fact Sheet for Healthcare Providers:  https://www.young.biz/ This test is not yet ap proved or cleared by the [place] FDA and  has been authorized for detection and/or diagnosis of SARS-CoV-2 by FDA under an Emergency Use Authorization (EUA). This EUA will remain  in effect (meaning this test can be used) for the duration of the COVID-19 declaration under Section 564(b)(1) of the Act, 21 U.S.C. section 360bbb-3(b)(1), unless the authorization is terminated or revoked sooner.    Influenza A by PCR NEGATIVE NEGATIVE Final   Influenza B by PCR NEGATIVE NEGATIVE Final    Comment: (NOTE) The Xpert Xpress SARS-CoV-2/FLU/RSV assay is intended as an aid in  the diagnosis of influenza from Nasopharyngeal swab specimens and  should not be used as a sole basis for treatment. Nasal washings and  aspirates are unacceptable for Xpert Xpress SARS-CoV-2/FLU/RSV  testing. Fact Sheet for Patients: https://www.moore.com/ Fact Sheet for Healthcare Providers: https://www.young.biz/ This test is not yet approved or cleared by the [place] FDA and  has been authorized for detection and/or diagnosis of SARS-CoV-2 by  FDA under an Emergency Use Authorization (EUA). This EUA will remain  in effect (meaning this test can be used) for the duration of the  Covid-19 declaration under Section 564(b)(1) of the Act, 21  U.S.C. section 360bbb-3(b)(1), unless the authorization  is  terminated or revoked. Performed at [facility], [address] [place], [place] [address]   SARS CORONAVIRUS 2 (TAT 6-24 HRS) Nasopharyngeal     Status: None   Collection Time: 10/04/19  4:30 PM   Specimen: Nasopharyngeal  Result Value Ref Range Status   SARS Coronavirus 2 NEGATIVE NEGATIVE Final    Comment: (NOTE) SARS-CoV-2 target nucleic acids are NOT DETECTED. The SARS-CoV-2 RNA is generally detectable in upper and lower respiratory specimens during the acute phase of infection. Negative results do not preclude SARS-CoV-2 infection, do not rule out co-infections with other pathogens, and should not be used as the sole basis for treatment or other patient management decisions. Negative results must be combined with clinical observations, patient history, and epidemiological information. The expected result is Negative. Fact Sheet for Patients: HairSlick.no Fact Sheet for Healthcare Providers: quierodirigir.com This test is not yet approved or cleared by the [place] FDA and  has been authorized for detection and/or diagnosis of SARS-CoV-2 by FDA under an Emergency Use Authorization (EUA). This EUA will remain  in effect (meaning this test can be used) for the duration of the COVID-19 declaration under Section 56 4(b)(1) of the Act, 21 U.S.C. section 360bbb-3(b)(1), unless the authorization is terminated or revoked sooner. Performed at [facility], [address], [place], [place] [address]      Radiological Exams on Admission: DG Chest Port 1 View  Result Date: 11-11-19 CLINICAL DATA:  COVID-19 positive 10/09/2019.  Hypoxia. EXAM: PORTABLE CHEST 1 VIEW COMPARISON:  10/02/2019 FINDINGS: Patient is rotated to the left. Lungs are adequately inflated. There is mild emphysematous disease over the upper lungs. There is moderate opacification over the left mid to lower lung and left retrocardiac  region. Findings may be due to effusion with atelectasis as infection in the left base is possible. Minimal prominence  of the left perihilar markings. Cardiomediastinal silhouette and remainder of the exam is unchanged. IMPRESSION: New opacification over the left base/retrocardiac region which may be due to effusion with atelectasis although infection is possible. Mild emphysematous disease. Electronically Signed   By: Elberta Fortisaniel  Boyle M.D.   On: 08-20-2019 11:39     EKG: Independently reviewed. Sinus rhythm, QTC 430, bifascicular block  Assessment/Plan Principal Problem:   Acute respiratory disease due to COVID-19 virus Active Problems:   DNR (do not resuscitate)   GERD (gastroesophageal reflux disease)   Hyperthyroidism   COPD (chronic obstructive pulmonary disease) (HCC)   Acute respiratory disease due to COVID-19 virus: Has oxygen desaturation to 87% on room air, has infiltration on chest x-ray.  -will admit to med-surg bed as inpt -Remdesivir per pharm -Solumedrol 20 mg bid -vitamin C, zinc.   -Atrovent inhaler, PRN albuterol inhaler -PRN Mucinex for cough -f/u Blood culture -D-dimer, BNP,Trop, LFT, CRP, LDH, Procalcitonin,, ferritin, fibinogen, TG, Hep B SAg -Daily CRP, Ferritin, D-dimer, -Will ask the patient to maintain an awake prone position for 16+ hours a day, if possible, with a minimum of 2-3 hours at a time -Will attempt to maintain euvolemia to a net negative fluid status -IF patient deteriorates, will consult PCCM and ID  DNR (do not resuscitate):  GERD (gastroesophageal reflux disease): -pepcid  Hyperthyroidism: -Methimazole  COPD (chronic obstructive pulmonary disease) (HCC): -Bronchodilators as above  Inpatient status:  # Patient requires inpatient status due to high intensity of service, high risk for further deterioration and high frequency of surveillance required.  I certify that at the point of admission it is my clinical judgment that the patient  will require inpatient hospital care spanning beyond 2 midnights from the point of admission.  . This patient has multiple chronic comorbidities including dementia, hyperthyroidism, COPD, GERD, tobacco abuse . Now patient has presenting with acute respiratory disease due to COVID-19 virus . The initial radiographic and laboratory data are worrisome because of  new opacification over the left base/retrocardiac region on CXR. . Current medical needs: please see my assessment and plan . Predictability of an adverse outcome (risk): Patient has multiple comorbidities, now presents with acute respiratory disease due to COVID-19 virus with hypoxia. given her old age, patient is at high risk of deteriorating. Will need to be treated in hospital for at least 2 days     DVT ppx:  SQ Lovenox Code Status: DNR (pt has DNR document from facility) Family Communication: None at bed side.    Disposition Plan:  Anticipate discharge back to previous SNF Consults called:  none Admission status: med-surg as inpt  Date of Service 08-20-2019    Lorretta HarpXilin Candler Ginsberg [facility]   If 7PM-7AM, please contact night-coverage [email or web address] Password [email or web address] 08-20-2019, 5:17 PM

## 2019-10-12 NOTE — ED Triage Notes (Signed)
Pt via EMS from [facility]. Per staff, pt O2 saturation were "low" and they pt her on NRB at 10L. Pt was previously here for pneumonia. NRB brought O2 to 98%. Pt has a hx of dementia.

## 2019-10-12 NOTE — ED Notes (Signed)
Pt tested positive for COVID on Tuesday 12/15 per facility.

## 2019-10-12 NOTE — ED Notes (Signed)
RT called

## 2019-10-12 NOTE — Consult Note (Signed)
Remdesivir - Pharmacy Brief Note   According to nursing home staff patient tested positive for Covid Tuesday, 10/09/2019, found today to be hypoxic on her 2 L of oxygen, EMS states patient required nonrebreather to maintain sats in the 90s and they brought the patient to the emergency department.   O:  ALT: 30 CXR: New opacification over the left base/retrocardiac region which may be due to effusion with atelectasis although infection is possible. SpO2: 99% on [place] 6 L/min   A/P:  Remdesivir 200 mg IVPB once followed by 100 mg IVPB daily x 4 days.   .me Oct 19, 2019 2:57 PM

## 2019-10-12 NOTE — ED Notes (Signed)
CRITICAL LAB: COVID is POSITIVE, [facility], Dr. Sidney Ace and NP Randol Kern notified, orders received

## 2019-10-13 ENCOUNTER — Other Ambulatory Visit: Payer: Self-pay

## 2019-10-13 DIAGNOSIS — U071 COVID-19: Principal | ICD-10-CM

## 2019-10-13 DIAGNOSIS — E0590 Thyrotoxicosis, unspecified without thyrotoxic crisis or storm: Secondary | ICD-10-CM

## 2019-10-13 DIAGNOSIS — J449 Chronic obstructive pulmonary disease, unspecified: Secondary | ICD-10-CM

## 2019-10-13 DIAGNOSIS — J069 Acute upper respiratory infection, unspecified: Secondary | ICD-10-CM

## 2019-10-13 LAB — CBC WITH DIFFERENTIAL/PLATELET
Abs Immature Granulocytes: 0.06 10*3/uL (ref 0.00–0.07)
Basophils Absolute: 0 10*3/uL (ref 0.0–0.1)
Basophils Relative: 0 %
Eosinophils Absolute: 0 10*3/uL (ref 0.0–0.5)
Eosinophils Relative: 0 %
HCT: 38.1 % (ref 36.0–46.0)
Hemoglobin: 12.1 g/dL (ref 12.0–15.0)
Immature Granulocytes: 1 %
Lymphocytes Relative: 9 %
Lymphs Abs: 0.6 10*3/uL — ABNORMAL LOW (ref 0.7–4.0)
MCH: 30.3 pg (ref 26.0–34.0)
MCHC: 31.8 g/dL (ref 30.0–36.0)
MCV: 95.3 fL (ref 80.0–100.0)
Monocytes Absolute: 0.5 10*3/uL (ref 0.1–1.0)
Monocytes Relative: 8 %
Neutro Abs: 5.2 10*3/uL (ref 1.7–7.7)
Neutrophils Relative %: 82 %
Platelets: 436 10*3/uL — ABNORMAL HIGH (ref 150–400)
RBC: 4 MIL/uL (ref 3.87–5.11)
RDW: 13.8 % (ref 11.5–15.5)
WBC: 6.3 10*3/uL (ref 4.0–10.5)
nRBC: 0 % (ref 0.0–0.2)

## 2019-10-13 LAB — FIBRIN DERIVATIVES D-DIMER (ARMC ONLY): Fibrin derivatives D-dimer (ARMC): 1153.5 ng/mL (FEU) — ABNORMAL HIGH (ref 0.00–499.00)

## 2019-10-13 LAB — COMPREHENSIVE METABOLIC PANEL
ALT: 28 U/L (ref 0–44)
AST: 34 U/L (ref 15–41)
Albumin: 2.6 g/dL — ABNORMAL LOW (ref 3.5–5.0)
Alkaline Phosphatase: 67 U/L (ref 38–126)
Anion gap: 14 (ref 5–15)
BUN: 31 mg/dL — ABNORMAL HIGH (ref 8–23)
CO2: 25 mmol/L (ref 22–32)
Calcium: 8.5 mg/dL — ABNORMAL LOW (ref 8.9–10.3)
Chloride: 102 mmol/L (ref 98–111)
Creatinine, Ser: 0.82 mg/dL (ref 0.44–1.00)
GFR calc Af Amer: >60 mL/min (ref >60)
GFR calc non Af Amer: >60 mL/min (ref >60)
Glucose, Bld: 135 mg/dL — ABNORMAL HIGH (ref 70–99)
Potassium: 4.2 mmol/L (ref 3.5–5.1)
Sodium: 141 mmol/L (ref 135–145)
Total Bilirubin: 0.6 mg/dL (ref 0.3–1.2)
Total Protein: 6 g/dL — ABNORMAL LOW (ref 6.5–8.1)

## 2019-10-13 LAB — FERRITIN: Ferritin: 271 ng/mL (ref 11–307)

## 2019-10-13 LAB — MAGNESIUM: Magnesium: 2.1 mg/dL (ref 1.7–2.4)

## 2019-10-13 LAB — MRSA PCR SCREENING: MRSA by PCR: NEGATIVE

## 2019-10-13 LAB — C-REACTIVE PROTEIN: CRP: 10.1 mg/dL — ABNORMAL HIGH (ref <1.0)

## 2019-10-13 MED ORDER — FUROSEMIDE 10 MG/ML IJ SOLN
20.0000 mg | Freq: Once | INTRAMUSCULAR | Status: AC
  Administered 2019-10-13: 15:00:00 20 mg via INTRAVENOUS
  Filled 2019-10-13: qty 4

## 2019-10-13 MED ORDER — HALOPERIDOL LACTATE 5 MG/ML IJ SOLN
1.0000 mg | Freq: Four times a day (QID) | INTRAMUSCULAR | Status: DC | PRN
  Administered 2019-10-13: 1 mg via INTRAVENOUS
  Filled 2019-10-13: qty 1

## 2019-10-13 NOTE — Progress Notes (Addendum)
PROGRESS NOTE    Shelly Duran  DTO:[id number] DOB: August 22, 1941 DOA: 10/17/19 PCP: Patient, No Pcp Per      Brief Narrative:  Shelly Duran is a 78 y.o. F with advanced Alzheimer's dementia, SNF-dwelling, hyperthryoidism, COPD and smoking who presents with SOB.  Patient was diagnosed at her facility 3 days prior to admission 12/15.  Over the last several days, she has had worsening shortness of breath, respiratory distress, and cough.  Finally sent to the ER.  In the ER, patient required 6 L oxygen to keep O2 sat 92%.  Chest x-ray showed pneumonia.       Assessment & Plan:  Coronavirus pneumonitis with acute hypoxic respiratory failure Patient presented Covid positive with pneumonia on chest x-ray and severe hypoxia in the setting of the ongoing 2020 COVID-19 pandemic.  O2 requirements has escalated overnight she is now requiring high flow nasal cannula at 12 L/min. -Continue remdesivir, day 2 of 5 -Continue Solu-Medrol, day 2 -VTE PPx with Lovenox -Continue Zinc and Vitamin C -Flutter valve, turn, cough, incentive spirometry q2hrs while awake -Nutrition consult -Lasix 20 mg IV once to maintain net negative    Dementia -Continue Risperdal  Moderate protein calorie malnutrition As evidenced by advanced systemic disease, poor muscle mass and fat. -Continue Ensure  Hyperthyroidism -Continue Tapazole        MDM and disposition: The below labs and imaging reports were reviewed and summarized above.  Medication management as above.  The patient was admitted with COVID-19.  She has severe hypoxic respiratory failure.    DVT prophylaxis: Lovenox Code Status: DO NOT RESUSCITATE Family Communication: Son by phone     Antimicrobials:   Remdesivir 12/18>>  Culture data:   12/18 blood culture no growth to date       Subjective: Per nursing, the patient has stable tachypnea, no new grunting, distress, desaturations.  No vomiting.  No diarrhea.  No  complaints of pain.  Objective: Vitals:   10/13/19 0914 10/13/19 0917 10/13/19 1153 10/13/19 1158  BP:  116/70 124/72   Pulse:  84 88   Resp:  20 17   Temp: 98.1 F (36.7 C)  98.1 F (36.7 C)   TempSrc:   Oral   SpO2:  (!) 89% 92%   Weight:    46.4 kg  Height:    5' 2.01" (1.575 m)    Intake/Output Summary (Last 24 hours) at 10/13/2019 1524 Last data filed at 10/13/2019 1158 Gross per 24 hour  Intake 250 ml  Output --  Net 250 ml   Filed Weights   10-17-19 1042 10/13/19 1158  Weight: 49 kg 46.4 kg    Examination: General appearance: Frail elderly appearing adult female, alert and in mild respiratory distress.  Appears to have fed herself breakfast, but made a mess. HEENT: Anicteric, conjunctiva pink, lids and lashes normal. No nasal deformity, discharge, epistaxis.  Lips moist, edentulous, oropharynx tacky dry, no oral lesions.   Skin: Warm and dry.   No suspicious rashes or lesions. Cardiac: Tachycardic, regular, nl S1-S2, no murmurs appreciated.  Capillary refill is brisk.  JVP not visible.  No LE edema.  Radial pulses 2+ and symmetric. Respiratory: Tachypneic, on high flow nasal cannula, coarse breath sounds bilaterally.   Abdomen: Abdomen soft.  No TTP or guarding. No ascites, distension, hepatosplenomegaly.   MSK: No deformities or effusions. Neuro: Awake and makes eye contact.  Moves all extremities symmetrically, but with generalized weakness.  No spontaneous verbalizations, groans occasionally.  Does not  follow commands consistently.    Psych: Attention diminished, judgment insight appear severely impaired.  To self only.     Data Reviewed: I have personally reviewed following labs and imaging studies:  CBC: Recent Labs  Lab 09/29/2019 1153 10/13/19 0631  WBC 11.8* 6.3  NEUTROABS 10.6* 5.2  HGB 12.4 12.1  HCT 38.6 38.1  MCV 95.5 95.3  PLT 421* 408*   Basic Metabolic Panel: Recent Labs  Lab 10/06/2019 1153 10/13/19 0631  NA 140 141  K 4.1 4.2  CL 100  102  CO2 26 25  GLUCOSE 143* 135*  BUN 20 31*  CREATININE 0.82 0.82  CALCIUM 8.3* 8.5*  MG  --  2.1   GFR: Estimated Creatinine Clearance: 42.8 mL/min (by C-G formula based on SCr of 0.82 mg/dL). Liver Function Tests: Recent Labs  Lab 10/06/2019 1153 10/13/19 0631  AST 40 34  ALT 30 28  ALKPHOS 76 67  BILITOT 0.5 0.6  PROT 6.4* 6.0*  ALBUMIN 2.9* 2.6*   No results for input(s): LIPASE, AMYLASE in the last 168 hours. No results for input(s): AMMONIA in the last 168 hours. Coagulation Profile: No results for input(s): INR, PROTIME in the last 168 hours. Cardiac Enzymes: No results for input(s): CKTOTAL, CKMB, CKMBINDEX, TROPONINI in the last 168 hours. BNP (last 3 results) No results for input(s): PROBNP in the last 8760 hours. HbA1C: No results for input(s): HGBA1C in the last 72 hours. CBG: No results for input(s): GLUCAP in the last 168 hours. Lipid Profile: Recent Labs    09/29/2019 1153  TRIG 77   Thyroid Function Tests: No results for input(s): TSH, T4TOTAL, FREET4, T3FREE, THYROIDAB in the last 72 hours. Anemia Panel: Recent Labs    10/06/2019 1153 10/13/19 0631  FERRITIN 325* 271   Urine analysis:    Component Value Date/Time   COLORURINE STRAW (A) 10/02/2019 1935   APPEARANCEUR CLEAR (A) 10/02/2019 1935   LABSPEC 1.011 10/02/2019 1935   PHURINE 6.0 10/02/2019 1935   GLUCOSEU >=500 (A) 10/02/2019 1935   HGBUR NEGATIVE 10/02/2019 [place] NEGATIVE 10/02/2019 [place] NEGATIVE 10/02/2019 1935   PROTEINUR NEGATIVE 10/02/2019 1935   NITRITE NEGATIVE 10/02/2019 1935   LEUKOCYTESUR NEGATIVE 10/02/2019 1935   Sepsis Labs: @LABRCNTIP (procalcitonin:4,lacticacidven:4)  ) Recent Results (from the past 240 hour(s))  SARS CORONAVIRUS 2 (TAT 6-24 HRS) Nasopharyngeal     Status: None   Collection Time: 10/04/19  4:30 PM   Specimen: Nasopharyngeal  Result Value Ref Range Status   SARS Coronavirus 2 NEGATIVE NEGATIVE Final    Comment:  (NOTE) SARS-CoV-2 target nucleic acids are NOT DETECTED. The SARS-CoV-2 RNA is generally detectable in upper and lower respiratory specimens during the acute phase of infection. Negative results do not preclude SARS-CoV-2 infection, do not rule out co-infections with other pathogens, and should not be used as the sole basis for treatment or other patient management decisions. Negative results must be combined with clinical observations, patient history, and epidemiological information. The expected result is Negative. Fact Sheet for Patients: SugarRoll.be Fact Sheet for Healthcare Providers: https://www.woods-mathews.com/ This test is not yet approved or cleared by the [place] FDA and  has been authorized for detection and/or diagnosis of SARS-CoV-2 by FDA under an Emergency Use Authorization (EUA). This EUA will remain  in effect (meaning this test can be used) for the duration of the COVID-19 declaration under Section 56 4(b)(1) of the Act, 21 U.S.C. section 360bbb-3(b)(1), unless the authorization is terminated or revoked sooner. Performed  at [facility], [address] [place], [place] [address]   SARS CORONAVIRUS 2 (TAT 6-24 HRS) Nasopharyngeal Nasopharyngeal Swab     Status: Abnormal   Collection Time: 2019-04-14 11:53 AM   Specimen: Nasopharyngeal Swab  Result Value Ref Range Status   SARS Coronavirus 2 POSITIVE (A) NEGATIVE Final    Comment: RESULT CALLED TO, READ BACK BY AND VERIFIED WITH: N.MERRIUM RN 2057 2020-03-2019 MCCORMICK K (NOTE) SARS-CoV-2 target nucleic acids are DETECTED. The SARS-CoV-2 RNA is generally detectable in upper and lower respiratory specimens during the acute phase of infection. Positive results are indicative of the presence of SARS-CoV-2 RNA. Clinical correlation with patient history and other diagnostic information is  necessary to determine patient infection status. Positive results do not  rule out bacterial infection or co-infection with other viruses.  The expected result is Negative. Fact Sheet for Patients: HairSlick.nohttps://www.fda.gov/media/138098/download Fact Sheet for Healthcare Providers: quierodirigir.comhttps://www.fda.gov/media/138095/download This test is not yet approved or cleared by the [place] FDA and  has been authorized for detection and/or diagnosis of SARS-CoV-2 by FDA under an Emergency Use Authorization (EUA). This EUA will remain  in effect (meaning this test can be used) fo r the duration of the COVID-19 declaration under Section 564(b)(1) of the Act, 21 U.S.C. section 360bbb-3(b)(1), unless the authorization is terminated or revoked sooner. Performed at [facility], [address] [place], [place] [address]   Blood Culture (routine x 2)     Status: None (Preliminary result)   Collection Time: 2019-04-14 11:53 AM   Specimen: BLOOD RIGHT WRIST  Result Value Ref Range Status   Specimen Description BLOOD RIGHT WRIST  Final   Special Requests   Final    BOTTLES DRAWN AEROBIC AND ANAEROBIC Blood Culture adequate volume   Culture   Final    NO GROWTH < 24 HOURS Performed at [facility], [address] [place], [place] [address]    Report Status PENDING  Incomplete  Blood Culture (routine x 2)     Status: None (Preliminary result)   Collection Time: 2019-04-14 11:53 AM   Specimen: BLOOD LEFT ARM  Result Value Ref Range Status   Specimen Description BLOOD LEFT ARM  Final   Special Requests   Final    BOTTLES DRAWN AEROBIC AND ANAEROBIC Blood Culture adequate volume   Culture   Final    NO GROWTH < 24 HOURS Performed at [facility], [address] [place], [place] [address]    Report Status PENDING  Incomplete         Radiology Studies: DG Chest Port 1 View  Result Date: 2020-03-2019 CLINICAL DATA:  COVID-19 positive 10/09/2019.  Hypoxia. EXAM: PORTABLE CHEST 1 VIEW COMPARISON:  10/02/2019 FINDINGS: Patient is rotated to the left.  Lungs are adequately inflated. There is mild emphysematous disease over the upper lungs. There is moderate opacification over the left mid to lower lung and left retrocardiac region. Findings may be due to effusion with atelectasis as infection in the left base is possible. Minimal prominence of the left perihilar markings. Cardiomediastinal silhouette and remainder of the exam is unchanged. IMPRESSION: New opacification over the left base/retrocardiac region which may be due to effusion with atelectasis although infection is possible. Mild emphysematous disease. Electronically Signed   By: Elberta Fortisaniel  Boyle M.D.   On: 2020-03-2019 11:39        Scheduled Meds: . vitamin C  500 mg Oral Daily  . cholecalciferol  1,000 Units Oral Daily  .  enoxaparin (LOVENOX) injection  40 mg Subcutaneous Q24H  . feeding supplement (ENSURE ENLIVE)  237 mL Oral BID BM  . guaiFENesin-dextromethorphan  5 mL Oral BID  . ipratropium  2 puff Inhalation Q4H  . methimazole  5 mg Oral Daily  . methylPREDNISolone (SOLU-MEDROL) injection  20 mg Intravenous BID  . multivitamin with minerals  1 tablet Oral Daily  . risperiDONE  1 mg Oral BID  . zinc sulfate  220 mg Oral Daily   Continuous Infusions: . remdesivir 100 mg in NS 100 mL Stopped (10/13/19 1017)     LOS: 1 day    Time spent: 35 minutes      Alberteen Sam, MD [facility] 10/13/2019, 3:24 PM     Please page through AMION:  [email or web address] Contact charge nurse for password If 7PM-7AM, please contact night-coverage

## 2019-10-13 NOTE — ED Notes (Signed)
Selinda Eon, RN able to get pt to wear O2 device but pt has removed again, NP Randol Kern aware

## 2019-10-13 NOTE — ED Notes (Signed)
HFNC reapplied; warm blanket given

## 2019-10-13 NOTE — ED Notes (Addendum)
Pt has removed HFNC; reports not wanting to wear it anymore; pt will not give reason, pt reports unwilling to wear any sort of assistive O2 device, pt refuses efforts to make them more comfortable   O2 monitor removed, pt consented to ear application 59%, on finger 83%, pt sleepy  NP Randol Kern made aware

## 2019-10-13 NOTE — Progress Notes (Signed)
Called patient's son to give update. Son had already spoken to physician. Son currently lives in [place]. Per Son, patient is usually energetic and more verbal.   Fuller Mandril, RN

## 2019-10-14 LAB — MAGNESIUM: Magnesium: 2 mg/dL (ref 1.7–2.4)

## 2019-10-14 LAB — CBC WITH DIFFERENTIAL/PLATELET
Abs Immature Granulocytes: 0.08 10*3/uL — ABNORMAL HIGH (ref 0.00–0.07)
Basophils Absolute: 0 10*3/uL (ref 0.0–0.1)
Basophils Relative: 0 %
Eosinophils Absolute: 0 10*3/uL (ref 0.0–0.5)
Eosinophils Relative: 0 %
HCT: 38.3 % (ref 36.0–46.0)
Hemoglobin: 12.6 g/dL (ref 12.0–15.0)
Immature Granulocytes: 1 %
Lymphocytes Relative: 6 %
Lymphs Abs: 0.6 10*3/uL — ABNORMAL LOW (ref 0.7–4.0)
MCH: 30.1 pg (ref 26.0–34.0)
MCHC: 32.9 g/dL (ref 30.0–36.0)
MCV: 91.4 fL (ref 80.0–100.0)
Monocytes Absolute: 0.6 10*3/uL (ref 0.1–1.0)
Monocytes Relative: 5 %
Neutro Abs: 9.2 10*3/uL — ABNORMAL HIGH (ref 1.7–7.7)
Neutrophils Relative %: 88 %
Platelets: 463 10*3/uL — ABNORMAL HIGH (ref 150–400)
RBC: 4.19 MIL/uL (ref 3.87–5.11)
RDW: 13.8 % (ref 11.5–15.5)
WBC: 10.5 10*3/uL (ref 4.0–10.5)
nRBC: 0 % (ref 0.0–0.2)

## 2019-10-14 LAB — COMPREHENSIVE METABOLIC PANEL
ALT: 31 U/L (ref 0–44)
AST: 33 U/L (ref 15–41)
Albumin: 2.6 g/dL — ABNORMAL LOW (ref 3.5–5.0)
Alkaline Phosphatase: 71 U/L (ref 38–126)
Anion gap: 8 (ref 5–15)
BUN: 44 mg/dL — ABNORMAL HIGH (ref 8–23)
CO2: 34 mmol/L — ABNORMAL HIGH (ref 22–32)
Calcium: 8.6 mg/dL — ABNORMAL LOW (ref 8.9–10.3)
Chloride: 99 mmol/L (ref 98–111)
Creatinine, Ser: 0.91 mg/dL (ref 0.44–1.00)
GFR calc Af Amer: >60 mL/min (ref >60)
GFR calc non Af Amer: >60 mL/min (ref >60)
Glucose, Bld: 167 mg/dL — ABNORMAL HIGH (ref 70–99)
Potassium: 4.6 mmol/L (ref 3.5–5.1)
Sodium: 141 mmol/L (ref 135–145)
Total Bilirubin: 0.5 mg/dL (ref 0.3–1.2)
Total Protein: 6.1 g/dL — ABNORMAL LOW (ref 6.5–8.1)

## 2019-10-14 LAB — C-REACTIVE PROTEIN: CRP: 6.4 mg/dL — ABNORMAL HIGH (ref <1.0)

## 2019-10-14 LAB — FIBRIN DERIVATIVES D-DIMER (ARMC ONLY): Fibrin derivatives D-dimer (ARMC): 905.89 ng/mL (FEU) — ABNORMAL HIGH (ref 0.00–499.00)

## 2019-10-14 LAB — FERRITIN: Ferritin: 254 ng/mL (ref 11–307)

## 2019-10-14 MED ORDER — METHYLPREDNISOLONE SODIUM SUCC 125 MG IJ SOLR
60.0000 mg | Freq: Two times a day (BID) | INTRAMUSCULAR | Status: DC
  Administered 2019-10-14 – 2019-10-17 (×6): 60 mg via INTRAVENOUS
  Filled 2019-10-14 (×6): qty 2

## 2019-10-14 MED ORDER — DEXTROSE-NACL 5-0.9 % IV SOLN: INTRAVENOUS | Status: AC

## 2019-10-14 MED ORDER — IPRATROPIUM BROMIDE HFA 17 MCG/ACT IN AERS
2.0000 | INHALATION_SPRAY | RESPIRATORY_TRACT | Status: DC | PRN
  Filled 2019-10-14: qty 12.9

## 2019-10-14 NOTE — Progress Notes (Signed)
PROGRESS NOTE    Shelly Duran  XBD:[id number] DOB: 1940-12-13 DOA: November 09, 2019 PCP: Patient, No Pcp Per      Brief Narrative:  Mrs. Lovin is a 78 y.o. F with advanced Alzheimer's dementia, SNF-dwelling, hyperthryoidism, COPD and smoking who presents with SOB.  Patient was diagnosed at her facility 3 days prior to admission 12/15.  Over the last several days, she has had worsening shortness of breath, respiratory distress, and cough.  Finally sent to the ER.  In the ER, patient required 6 L oxygen to keep O2 sat 92%.  Chest x-ray showed pneumonia.       Assessment & Plan:  Coronavirus pneumonitis with acute hypoxic respiratory failure Patient presented Covid positive with pneumonia on chest x-ray and severe hypoxia in the setting of the ongoing 2020 COVID-19 pandemic.  Lasix given 12/19.  O2 requirements up to 14L.  Inflammatory markers improving but mentation worsening today. -Continue remdesivir, day 3 of 5 -Continue Solu-Medrol, increased dose, day 3 -Continue VTE PPx with Lovenox -Continue Zinc and Vitamin C -Flutter valve, turn, cough, incentive spirometry q2hrs while awake     Dementia with acute COVID metabolic encephalopathy Patient is interactive at baseline, currently somnolent and confused. -Continue Risperdal -Start gentle IV fluids given decreased oral intake  Moderate protein calorie malnutrition As evidenced by advanced systemic disease, poor muscle mass and fat, BMI 18. -Continue Ensure -Nutrition consult  Hyperthyroidism -Continue Tapazole        MDM and disposition: The below labs and imaging reports reviewed and summarized above.  Medication management as above.   The patient was admitted with COVID-19.  She has severe hypoxic respiratory failure and encephalopathy, which are worsening.  At this time we have maximized medical therapy, but the patient continues to worsen.  Medical opinion, she has an exceedingly poor prognosis, given her  dementia and underweight prior to admission.  There are limited options to escalate care if her respiratory status worsened, and she is a DNR/DNI.  We will continue maximal medical therapy, involve palliative care at this time.    DVT prophylaxis: Lovenox Code Status: DO NOT RESUSCITATE Family Communication: Son by phone     Antimicrobials:   Remdesivir 12/18>>  Culture data:   12/18 blood culture no growth to date       Subjective: Patient denies dyspnea, chest pain, headache.  Nursing report that her mentation is poor, her hypoxia is worsening although she has no grunting or accessory muscle use.    Objective: Vitals:   10/13/19 1153 10/13/19 1158 10/13/19 1934 10/14/19 0511  BP: 124/72  (!) 124/91 126/84  Pulse: 88  96 75  Resp: 17  (!) 24   Temp: 98.1 F (36.7 C)  98.4 F (36.9 C)   TempSrc: Oral  Oral   SpO2: 92%  91%   Weight:  46.4 kg    Height:  5' 2.01" (1.575 m)      Intake/Output Summary (Last 24 hours) at 10/14/2019 1359 Last data filed at 10/13/2019 1800 Gross per 24 hour  Intake --  Output 700 ml  Net -700 ml   Filed Weights   11/09/19 1042 10/13/19 1158  Weight: 49 kg 46.4 kg    Examination: General appearance: Frail elderly adult female, somnolent, sluggish to arouse, does not make eye contact. HEENT: Anicteric, conjunctiva pink, lids and lashes normal. No nasal deformity, discharge, epistaxis.  Lips moist, edentulous, oropharynx dry, no oral lesions, hearing seems diminished    Skin: Warm and dry.  No suspicious rashes or lesions. Cardiac: Tachycardic, regular, no murmurs appreciated.  No LE edema.    Respiratory: Tachypneic, shallow, diminished, no wheezing.   Abdomen: Abdomen soft.  No grimace to palpation, no rigidity or guarding. No ascites, distension, hepatosplenomegaly.   MSK: No deformities or effusions of the large joints of the upper or lower extremities bilaterally.  Diffuse loss of subcutaneous muscle mass and fat. Neuro:  Sleeping, sluggish to arouse.  Makes one-word answers ("yes", "no").  Muscle tone diminished, weakness in all 4 extremities, sensorium diminished.   Psych: Sensorium diminished, attention diminished, judgment insight severely impaired.        Data Reviewed: I have personally reviewed following labs and imaging studies:  CBC: Recent Labs  Lab 09/30/2019 1153 10/13/19 0631 10/14/19 0449  WBC 11.8* 6.3 10.5  NEUTROABS 10.6* 5.2 9.2*  HGB 12.4 12.1 12.6  HCT 38.6 38.1 38.3  MCV 95.5 95.3 91.4  PLT 421* 436* 463*   Basic Metabolic Panel: Recent Labs  Lab 10/21/2019 1153 10/13/19 0631 10/14/19 0449  NA 140 141 141  K 4.1 4.2 4.6  CL 100 102 99  CO2 26 25 34*  GLUCOSE 143* 135* 167*  BUN 20 31* 44*  CREATININE 0.82 0.82 0.91  CALCIUM 8.3* 8.5* 8.6*  MG  --  2.1 2.0   GFR: Estimated Creatinine Clearance: 38.5 mL/min (by C-G formula based on SCr of 0.91 mg/dL). Liver Function Tests: Recent Labs  Lab 10/20/2019 1153 10/13/19 0631 10/14/19 0449  AST 40 34 33  ALT ALKPHOS 76 67 71  BILITOT 0.5 0.6 0.5  PROT 6.4* 6.0* 6.1*  ALBUMIN 2.9* 2.6* 2.6*   No results for input(s): LIPASE, AMYLASE in the last 168 hours. No results for input(s): AMMONIA in the last 168 hours. Coagulation Profile: No results for input(s): INR, PROTIME in the last 168 hours. Cardiac Enzymes: No results for input(s): CKTOTAL, CKMB, CKMBINDEX, TROPONINI in the last 168 hours. BNP (last 3 results) No results for input(s): PROBNP in the last 8760 hours. HbA1C: No results for input(s): HGBA1C in the last 72 hours. CBG: No results for input(s): GLUCAP in the last 168 hours. Lipid Profile: Recent Labs    09/28/2019 1153  TRIG 77   Thyroid Function Tests: No results for input(s): TSH, T4TOTAL, FREET4, T3FREE, THYROIDAB in the last 72 hours. Anemia Panel: Recent Labs    10/13/19 0631 10/14/19 0449  FERRITIN 271 254   Urine analysis:    Component Value Date/Time   COLORURINE STRAW  (A) 10/02/2019 1935   APPEARANCEUR CLEAR (A) 10/02/2019 1935   LABSPEC 1.011 10/02/2019 1935   PHURINE 6.0 10/02/2019 1935   GLUCOSEU >=500 (A) 10/02/2019 1935   HGBUR NEGATIVE 10/02/2019 1935   BILIRUBINUR NEGATIVE 10/02/2019 1935   KETONESUR NEGATIVE 10/02/2019 1935   PROTEINUR NEGATIVE 10/02/2019 1935   NITRITE NEGATIVE 10/02/2019 1935   LEUKOCYTESUR NEGATIVE 10/02/2019 1935   Sepsis Labs: (procalcitonin:4,lacticacidven:4)  ) Recent Results (from the past 240 hour(s))  SARS CORONAVIRUS 2 (TAT 6-24 HRS) Nasopharyngeal     Status: None   Collection Time: 10/04/19  4:30 PM   Specimen: Nasopharyngeal  Result Value Ref Range Status   SARS Coronavirus 2 NEGATIVE NEGATIVE Final    Comment: (NOTE) SARS-CoV-2 target nucleic acids are NOT DETECTED. The SARS-CoV-2 RNA is generally detectable in upper and lower respiratory specimens during the acute phase of infection. Negative results do not preclude SARS-CoV-2 infection, do not rule out co-infections with other pathogens, and should not  be used as the sole basis for treatment or other patient management decisions. Negative results must be combined with clinical observations, patient history, and epidemiological information. The expected result is Negative. Fact Sheet for Patients: SugarRoll.be Fact Sheet for Healthcare Providers: https://www.woods-mathews.com/ This test is not yet approved or cleared by the [place] FDA and  has been authorized for detection and/or diagnosis of SARS-CoV-2 by FDA under an Emergency Use Authorization (EUA). This EUA will remain  in effect (meaning this test can be used) for the duration of the COVID-19 declaration under Section 56 4(b)(1) of the Act, 21 U.S.C. section 360bbb-3(b)(1), unless the authorization is terminated or revoked sooner. Performed at [facility], [place] [address], [place], [place] [address]   SARS CORONAVIRUS 2 (TAT  6-24 HRS) Nasopharyngeal Nasopharyngeal Swab     Status: Abnormal   Collection Time: 09/29/2019 11:53 AM   Specimen: Nasopharyngeal Swab  Result Value Ref Range Status   SARS Coronavirus 2 POSITIVE (A) NEGATIVE Final    Comment: RESULT CALLED TO, READ BACK BY AND VERIFIED WITH: N.MERRIUM RN 2057 10/15/2019 MCCORMICK K (NOTE) SARS-CoV-2 target nucleic acids are DETECTED. The SARS-CoV-2 RNA is generally detectable in upper and lower respiratory specimens during the acute phase of infection. Positive results are indicative of the presence of SARS-CoV-2 RNA. Clinical correlation with patient history and other diagnostic information is  necessary to determine patient infection status. Positive results do not rule out bacterial infection or co-infection with other viruses.  The expected result is Negative. Fact Sheet for Patients: SugarRoll.be Fact Sheet for Healthcare Providers: https://www.woods-mathews.com/ This test is not yet approved or cleared by the [place] FDA and  has been authorized for detection and/or diagnosis of SARS-CoV-2 by FDA under an Emergency Use Authorization (EUA). This EUA will remain  in effect (meaning this test can be used) fo r the duration of the COVID-19 declaration under Section 564(b)(1) of the Act, 21 U.S.C. section 360bbb-3(b)(1), unless the authorization is terminated or revoked sooner. Performed at [facility], [place] [address], [place], [place] [address]   Blood Culture (routine x 2)     Status: None (Preliminary result)   Collection Time: 09/27/2019 11:53 AM   Specimen: BLOOD RIGHT WRIST  Result Value Ref Range Status   Specimen Description BLOOD RIGHT WRIST  Final   Special Requests   Final    BOTTLES DRAWN AEROBIC AND ANAEROBIC Blood Culture adequate volume   Culture   Final    NO GROWTH 2 DAYS Performed at [facility], [address] [place], [place] [address]    Report Status  PENDING  Incomplete  Blood Culture (routine x 2)     Status: None (Preliminary result)   Collection Time: 10/23/2019 11:53 AM   Specimen: BLOOD LEFT ARM  Result Value Ref Range Status   Specimen Description BLOOD LEFT ARM  Final   Special Requests   Final    BOTTLES DRAWN AEROBIC AND ANAEROBIC Blood Culture adequate volume   Culture   Final    NO GROWTH 2 DAYS Performed at [facility], [address] [place], [place] [address]    Report Status PENDING  Incomplete  MRSA PCR Screening     Status: None   Collection Time: 10/13/19  7:55 PM   Specimen: Nasal Mucosa; Nasopharyngeal  Result Value Ref Range Status   MRSA by PCR NEGATIVE NEGATIVE Final    Comment:        The GeneXpert MRSA Assay (FDA  approved for NASAL specimens only), is one component of a comprehensive MRSA colonization surveillance program. It is not intended to diagnose MRSA infection nor to guide or monitor treatment for MRSA infections. Performed at [facility], [address] [place], [place] [address]          Radiology Studies: No results found.      Scheduled Meds: . vitamin C  500 mg Oral Daily  . cholecalciferol  1,000 Units Oral Daily  . enoxaparin (LOVENOX) injection  40 mg Subcutaneous Q24H  . feeding supplement (ENSURE ENLIVE)  237 mL Oral BID BM  . guaiFENesin-dextromethorphan  5 mL Oral BID  . ipratropium  2 puff Inhalation Q4H  . methimazole  5 mg Oral Daily  . methylPREDNISolone (SOLU-MEDROL) injection  60 mg Intravenous BID  . multivitamin with minerals  1 tablet Oral Daily  . risperiDONE  1 mg Oral BID  . zinc sulfate  220 mg Oral Daily   Continuous Infusions: . dextrose 5 % and 0.9% NaCl    . remdesivir 100 mg in NS 100 mL 100 mg (10/14/19 0828)     LOS: 2 days    Time spent: 35 minutes      Alberteen Samhristopher P Addalyn Speedy, MD [facility] 10/14/2019, 1:59 PM     Please page through AMION:  [email or web address] Contact charge nurse for password If  7PM-7AM, please contact night-coverage

## 2019-10-14 NOTE — Progress Notes (Signed)
Notified Dr. Loleta Books that CCMD called and stated patient had a run of Vtach and bigeminy and trigeminy. Patients current HR 87. No new orders at this time.

## 2019-10-14 NOTE — Progress Notes (Signed)
Talked to Dr. Loleta Books about patient having cardiacing monitor and Tele Sitter. He wants to keep cardiac monitor and so long as she is doing good discontinue Oncologist.

## 2019-10-14 NOTE — Progress Notes (Signed)
Shelly Duran closest of kin currently in  [place] #[phone] and she knows the password

## 2019-10-14 NOTE — Progress Notes (Signed)
Notified Dr. Loleta Books that I have been attempting to give patient her inhaler today but she does not understand how to inhale it. Dr. Loleta Books changed her inhaler to be PRN.

## 2019-10-14 NOTE — Progress Notes (Signed)
Patient's O2 dropped down to 81%. Patient was sleeping and breathing through her mouth. I woke and sit her up and told her to breathe through her nose. After a couple minutes she came back up to 92%. Notified Dr. Loleta Books, will continue to monitor.

## 2019-10-15 LAB — COMPREHENSIVE METABOLIC PANEL
ALT: 31 U/L (ref 0–44)
AST: 34 U/L (ref 15–41)
Albumin: 2.8 g/dL — ABNORMAL LOW (ref 3.5–5.0)
Alkaline Phosphatase: 76 U/L (ref 38–126)
Anion gap: 14 (ref 5–15)
BUN: 39 mg/dL — ABNORMAL HIGH (ref 8–23)
CO2: 27 mmol/L (ref 22–32)
Calcium: 8.8 mg/dL — ABNORMAL LOW (ref 8.9–10.3)
Chloride: 102 mmol/L (ref 98–111)
Creatinine, Ser: 0.67 mg/dL (ref 0.44–1.00)
GFR calc Af Amer: >60 mL/min (ref >60)
GFR calc non Af Amer: >60 mL/min (ref >60)
Glucose, Bld: 228 mg/dL — ABNORMAL HIGH (ref 70–99)
Potassium: 4.6 mmol/L (ref 3.5–5.1)
Sodium: 143 mmol/L (ref 135–145)
Total Bilirubin: 0.6 mg/dL (ref 0.3–1.2)
Total Protein: 6.4 g/dL — ABNORMAL LOW (ref 6.5–8.1)

## 2019-10-15 LAB — CBC WITH DIFFERENTIAL/PLATELET
Abs Immature Granulocytes: 0.06 10*3/uL (ref 0.00–0.07)
Basophils Absolute: 0 10*3/uL (ref 0.0–0.1)
Basophils Relative: 0 %
Eosinophils Absolute: 0 10*3/uL (ref 0.0–0.5)
Eosinophils Relative: 0 %
HCT: 41.8 % (ref 36.0–46.0)
Hemoglobin: 13.2 g/dL (ref 12.0–15.0)
Immature Granulocytes: 1 %
Lymphocytes Relative: 5 %
Lymphs Abs: 0.4 10*3/uL — ABNORMAL LOW (ref 0.7–4.0)
MCH: 30 pg (ref 26.0–34.0)
MCHC: 31.6 g/dL (ref 30.0–36.0)
MCV: 95 fL (ref 80.0–100.0)
Monocytes Absolute: 0.3 10*3/uL (ref 0.1–1.0)
Monocytes Relative: 3 %
Neutro Abs: 8.5 10*3/uL — ABNORMAL HIGH (ref 1.7–7.7)
Neutrophils Relative %: 91 %
Platelets: 600 10*3/uL — ABNORMAL HIGH (ref 150–400)
RBC: 4.4 MIL/uL (ref 3.87–5.11)
RDW: 13.4 % (ref 11.5–15.5)
WBC: 9.2 10*3/uL (ref 4.0–10.5)
nRBC: 0 % (ref 0.0–0.2)

## 2019-10-15 LAB — FERRITIN: Ferritin: 214 ng/mL (ref 11–307)

## 2019-10-15 LAB — FIBRIN DERIVATIVES D-DIMER (ARMC ONLY): Fibrin derivatives D-dimer (ARMC): 912.78 ng/mL (FEU) — ABNORMAL HIGH (ref 0.00–499.00)

## 2019-10-15 LAB — MAGNESIUM: Magnesium: 2 mg/dL (ref 1.7–2.4)

## 2019-10-15 LAB — C-REACTIVE PROTEIN: CRP: 3.5 mg/dL — ABNORMAL HIGH (ref <1.0)

## 2019-10-15 NOTE — Care Management Important Message (Signed)
Important Message  Patient Details  Name: Shelly Duran MRN: [id number] Date of Birth: 16-Mar-1941   Medicare Important Message Given:  Yes  Verbally reviewed with son, Mahealani Sulak, over phone at [phone].  Aware of right.  Requested copy be sent to his email.  Confirmed address.  Copy of Medicare IM sent securely to email address provided: [email or web address].   Dannette Barbara 10/15/2019, 2:36 PM

## 2019-10-15 NOTE — Progress Notes (Signed)
Nutrition Follow Up Note   DOCUMENTATION CODES:   Severe malnutrition in context of chronic illness  INTERVENTION:   Provide Ensure Enlive po BID, each supplement provides 350 kcal and 20 grams of protein. Patient prefers chocolate  Provide Magic cup TID with meals, each supplement provides 290 kcal and 9 grams of protein  Dysphagia 3 diet   NUTRITION DIAGNOSIS:   Severe Malnutrition related to chronic illness(COPD, dementia) as evidenced by severe muscle depletion, severe fat depletion.  GOAL:   Patient will meet greater than or equal to 90% of their needs  MONITOR:   PO intake, Supplement acceptance, Labs, Weight trends, Skin, I & O's  REASON FOR ASSESSMENT:   Other (Comment)(Low BMI)    ASSESSMENT:   78 year old female with Alzheimer's dementia, COPD admitted with COVID 19 PNA.   Pt familiar to nutrition department from recent previous admit. Pt with poor appetite and oral intake at baseline r/t advanced dementia. Pt does tend to drink better than eat. Pt documented to be eating 60-100% of meals in hospital; pt ate 60% of her breakfast today. Recommend continue Ensure supplements. RD will add Magic Cups to meal trays. Per chart, pt is down 3lbs(7%) since her last admit; unsure how much of this is true weight loss vs dehydration.   Medications reviewed and include: vitamin C, D3, lovenox, tapazole, solumedrol, MVI, zinc, NaCl w/ 5% dextrose @50ml /hr  Labs reviewed: BUN 39(H), Mg 2.0 wnl cbgs- 93, 120 x 24 hrs  Diet Order:   Diet Order            DIET DYS 3 Room service appropriate? No; Fluid consistency: Thin  Diet effective now             EDUCATION NEEDS:   Not appropriate for education at this time  Skin:  Skin Assessment: Reviewed RN Assessment  Last BM:  12/21- Type 6  Height:   Ht Readings from Last 1 Encounters:  10/13/19 5' 2.01" (1.575 m)   Weight:   Wt Readings from Last 1 Encounters:  10/13/19 46.4 kg   Ideal Body Weight:  50  kg  BMI:  Body mass index is 18.7 kg/m.  Estimated Nutritional Needs:   Kcal:  1300-1500kcal/day  Protein:  65-75g/day  Fluid:  1.3-1.5L/day  Koleen Distance MS, RD, LDN Pager #- [phone] Office#- [phone] After Hours Pager: [phone]

## 2019-10-15 NOTE — Progress Notes (Signed)
Notified Dr. Loleta Books that patient has pulled o2 off x2 and desated to 70's and 80's. Once o2 is returned she goes back up to 90's. She done ok with breakfast eating about 60% and drank all fluids. She is working on lunch not ate much so far. A while later notified Dr. Loleta Books that patient went down to 82% with the o2 on. She wont keep a mask on but she breaths through her mouth a lot. She came back up to 98 now with a NRB sitting over her face with O2 via nasal cannula.

## 2019-10-15 NOTE — Progress Notes (Signed)
PROGRESS NOTE    Shelly Duran  DPO:[id number] DOB: [birth year] DOA: 10-20-2019 PCP: Patient, No Pcp Per      Brief Narrative:  Shelly Duran is a 78 y.o. F with advanced Alzheimer's dementia, SNF-dwelling, hyperthryoidism, COPD and smoking who presents with SOB.  Patient was diagnosed at her facility 3 days prior to admission 12/15.  Over the last several days, she has had worsening shortness of breath, respiratory distress, and cough.  Finally sent to the ER.  In the ER, patient required 6 L oxygen to keep O2 sat 92%.  Chest x-ray showed pneumonia.       Assessment & Plan:  Coronavirus pneumonitis with acute hypoxic respiratory failure Patient presented Covid positive with pneumonia on chest x-ray and severe hypoxia in the setting of the ongoing 2020 COVID-19 pandemic.  Lasix given 12/19.    O2 requirements trending up to 15L Inflammatory markers have trended down but mentation still very confused, creatinine and LFTs stable. -Continue remdesivir day 4 of 5 -Continue Solu-Medrol, day 4 -Continue VTE PPx with Lovenox -Continue Zinc and Vitamin C -Flutter valve, turn, cough, incentive spirometry q2hrs while awake     Dementia with acute COVID metabolic encephalopathy Patient is interactive at baseline, but currently is very somnolent most of the day, confused and pulling off O2 and mostly nonverbal.   -Continue Risperdal -Continue gentle IV fluids given decreased oral intake  Ventricular tachycardia Patient has had several episodes of nonsustained VT without hemodynamic changes.  This seems to be related to hypoxia when she pulls her O2 off, corrects when her O2 is replaced. -Keep Mag>2, K>4  Moderate protein calorie malnutrition As evidenced by advanced systemic disease, poor muscle mass and fat, BMI 18. -Continue Ensure as tolerated -Nutrition consult  Hyperthyroidism TSH normal  -Continue Tapazole        MDM and disposition: The below labs and imaging  reports reviewed and summarized above.  Medication management as above.   The patient was admitted with COVID-19.  She has severe hypoxic respiratory failure and encephalopathy.  At this time we have maximized medical therapy, without improvement in respiratory status or mentation.    Given her limited functional status at baseline (SLUMS 17/30 5 years ago, now lives in memory care, BMI 18) I suspect that her prognosis is exceedingly poor.  We will continue current management, but have no options for escalating care if she deterioates, and so will enlist Palliative care assistance.  Shelly Duran understands likely outcome, in agreement with current mgmt.       DVT prophylaxis: Lovenox Code Status: DO NOT RESUSCITATE Family Communication: Shelly Duran by phone     Antimicrobials:   Remdesivir 12/18>>  Culture data:   12/18 blood culture no growth to date       Subjective: Patient pulled off O2 overnight twice, resulting in severe hypoxia.  Metnation no improvement.  No vomiting.  Poor PO intake persists.  No fever.        Objective: Vitals:   10/14/19 1440 10/14/19 2223 10/15/19 0654 10/15/19 0811  BP: (!) 143/97 (!) 144/94 (!) 123/95 130/79  Pulse: 87 72 (!) 107 88  Resp: 20 18    Temp: 98.5 F (36.9 C) 97.7 F (36.5 C) 97.7 F (36.5 C)   TempSrc: Oral Oral Oral   SpO2: 100% 100% (!) 85% 90%  Weight:      Height:        Intake/Output Summary (Last 24 hours) at 10/15/2019 1123 Last data filed at 10/14/2019  1858 Gross per 24 hour  Intake 100 ml  Output 600 ml  Net -500 ml   Filed Weights   2018/12/15 1042 10/13/19 1158  Weight: 49 kg 46.4 kg    Examination: General appearance: thin elderly  adult female, awake and makes eye contact, in no acute distress.   HEENT: Anicteric, conjunctiva pink, lids and lashes normal. No nasal deformity, discharge, epistaxis.  Lips moist, dentures, OP dry, no oral lesions, hearing diminished  Skin: Warm and dry.  No suspicious rashes or  lesions. Cardiac: tachycardic, regular, no murmurs appreciated.  No LE edema.    Respiratory: Tachypneic, shallow, no accessory muscle use, no wheezing or rales.    Abdomen: Abdomen soft.  No grimace to palpation. No ascites, distension, hepatosplenomegaly.   MSK: No deformities or effusions of the large joints of the upper or lower extremities bilaterally.  Diffuse loss of subcutaneous muscle mass and fat Neuro: Awake and makes eye contact.  States name only, states "no" when asked simple questions.Muscle tone diminished, without fasciculations.  Moves hands with generalized weakness, mostly does not follow comands     Psych: Attention diminished.  Fluctuating confusion.  Judgment seems impaired.           Data Reviewed: I have personally reviewed following labs and imaging studies:  CBC: Recent Labs  Lab 2018/12/15 1153 10/13/19 0631 10/14/19 0449 10/15/19 0540  WBC 11.8* 6.3 10.5 9.2  NEUTROABS 10.6* 5.2 9.2* 8.5*  HGB 12.4 12.1 12.6 13.2  HCT 38.6 38.1 38.3 41.8  MCV 95.5 95.3 91.4 95.0  PLT 421* 436* 463* 600*   Basic Metabolic Panel: Recent Labs  Lab 2018/12/15 1153 10/13/19 0631 10/14/19 0449 10/15/19 0540  NA 140 141 141 143  K 4.1 4.2 4.6 4.6  CL 100 102 99 102  CO2 26 25 34* 27  GLUCOSE 143* 135* 167* 228*  BUN 20 31* 44* 39*  CREATININE 0.82 0.82 0.91 0.67  CALCIUM 8.3* 8.5* 8.6* 8.8*  MG  --  2.1 2.0 2.0   GFR: Estimated Creatinine Clearance: 43.8 mL/min (by C-G formula based on SCr of 0.67 mg/dL). Liver Function Tests: Recent Labs  Lab 2018/12/15 1153 10/13/19 0631 10/14/19 0449 10/15/19 0540  AST 40 34 33 34  ALT 30 28 31 31   ALKPHOS 76 67 71 76  BILITOT 0.5 0.6 0.5 0.6  PROT 6.4* 6.0* 6.1* 6.4*  ALBUMIN 2.9* 2.6* 2.6* 2.8*   No results for input(s): LIPASE, AMYLASE in the last 168 hours. No results for input(s): AMMONIA in the last 168 hours. Coagulation Profile: No results for input(s): INR, PROTIME in the last 168 hours. Cardiac  Enzymes: No results for input(s): CKTOTAL, CKMB, CKMBINDEX, TROPONINI in the last 168 hours. BNP (last 3 results) No results for input(s): PROBNP in the last 8760 hours. HbA1C: No results for input(s): HGBA1C in the last 72 hours. CBG: No results for input(s): GLUCAP in the last 168 hours. Lipid Profile: Recent Labs    2018/12/15 1153  TRIG 77   Thyroid Function Tests: No results for input(s): TSH, T4TOTAL, FREET4, T3FREE, THYROIDAB in the last 72 hours. Anemia Panel: Recent Labs    10/14/19 0449 10/15/19 0540  FERRITIN 254 214   Urine analysis:    Component Value Date/Time   COLORURINE STRAW (A) 10/02/2019 1935   APPEARANCEUR CLEAR (A) 10/02/2019 1935   LABSPEC 1.011 10/02/2019 1935   PHURINE 6.0 10/02/2019 1935   GLUCOSEU >=500 (A) 10/02/2019 1935   HGBUR NEGATIVE 10/02/2019 1935   BILIRUBINUR  NEGATIVE 10/02/2019 1935   KETONESUR NEGATIVE 10/02/2019 1935   PROTEINUR NEGATIVE 10/02/2019 1935   NITRITE NEGATIVE 10/02/2019 1935   LEUKOCYTESUR NEGATIVE 10/02/2019 1935   Sepsis Labs: (procalcitonin:4,lacticacidven:4)  ) Recent Results (from the past 240 hour(s))  SARS CORONAVIRUS 2 (TAT 6-24 HRS) Nasopharyngeal Nasopharyngeal Swab     Status: Abnormal   Collection Time: 11/06/19 11:53 AM   Specimen: Nasopharyngeal Swab  Result Value Ref Range Status   SARS Coronavirus 2 POSITIVE (A) NEGATIVE Final    Comment: RESULT CALLED TO, READ BACK BY AND VERIFIED WITH: N.MERRIUM RN 2057 November 06, 2019 MCCORMICK K (NOTE) SARS-CoV-2 target nucleic acids are DETECTED. The SARS-CoV-2 RNA is generally detectable in upper and lower respiratory specimens during the acute phase of infection. Positive results are indicative of the presence of SARS-CoV-2 RNA. Clinical correlation with patient history and other diagnostic information is  necessary to determine patient infection status. Positive results do not rule out bacterial infection or co-infection with other viruses.  The  expected result is Negative. Fact Sheet for Patients: HairSlick.no Fact Sheet for Healthcare Providers: quierodirigir.com This test is not yet approved or cleared by the [place] FDA and  has been authorized for detection and/or diagnosis of SARS-CoV-2 by FDA under an Emergency Use Authorization (EUA). This EUA will remain  in effect (meaning this test can be used) fo r the duration of the COVID-19 declaration under Section 564(b)(1) of the Act, 21 U.S.C. section 360bbb-3(b)(1), unless the authorization is terminated or revoked sooner. Performed at [facility], [address], [place], [place] [address]   Blood Culture (routine x 2)     Status: None (Preliminary result)   Collection Time: 11/06/2019 11:53 AM   Specimen: BLOOD RIGHT WRIST  Result Value Ref Range Status   Specimen Description BLOOD RIGHT WRIST  Final   Special Requests   Final    BOTTLES DRAWN AEROBIC AND ANAEROBIC Blood Culture adequate volume   Culture   Final    NO GROWTH 3 DAYS Performed at [facility], [address], [place], [place] [address]    Report Status PENDING  Incomplete  Blood Culture (routine x 2)     Status: None (Preliminary result)   Collection Time: 11-06-19 11:53 AM   Specimen: BLOOD LEFT ARM  Result Value Ref Range Status   Specimen Description BLOOD LEFT ARM  Final   Special Requests   Final    BOTTLES DRAWN AEROBIC AND ANAEROBIC Blood Culture adequate volume   Culture   Final    NO GROWTH 3 DAYS Performed at [facility], [address], [place], [place] [address]    Report Status PENDING  Incomplete  MRSA PCR Screening     Status: None   Collection Time: 10/13/19  7:55 PM   Specimen: Nasal Mucosa; Nasopharyngeal  Result Value Ref Range Status   MRSA by PCR NEGATIVE NEGATIVE Final    Comment:        The GeneXpert MRSA Assay (FDA approved for NASAL specimens only), is one component of  a comprehensive MRSA colonization surveillance program. It is not intended to diagnose MRSA infection nor to guide or monitor treatment for MRSA infections. Performed at [facility], [address] [place], [place] [address]          Radiology Studies: No results found.      Scheduled Meds: . vitamin C  500 mg Oral Daily  . cholecalciferol  1,000 Units Oral Daily  . enoxaparin (LOVENOX) injection  40 mg Subcutaneous Q24H  . feeding supplement (ENSURE ENLIVE)  237 mL Oral BID BM  . guaiFENesin-dextromethorphan  5 mL Oral BID  . methimazole  5 mg Oral Daily  . methylPREDNISolone (SOLU-MEDROL) injection  60 mg Intravenous BID  . multivitamin with minerals  1 tablet Oral Daily  . risperiDONE  1 mg Oral BID  . zinc sulfate  220 mg Oral Daily   Continuous Infusions: . dextrose 5 % and 0.9% NaCl 50 mL/hr at 10/14/19 1507  . remdesivir 100 mg in NS 100 mL Stopped (10/14/19 1445)     LOS: 3 days    Time spent: 35 minutes       Edwin Dada, MD [facility] 10/15/2019, 11:23 AM     Please page through [place]:  [email or web address] Contact charge nurse for password If 7PM-7AM, please contact night-coverage

## 2019-10-15 NOTE — Progress Notes (Signed)
Notified Dr. Loleta Books, Patients o2 dropped to 76%, i came in and set her up and got her breathing through her nose and she came up to currently 90%.

## 2019-10-15 NOTE — Progress Notes (Signed)
Notified Dr. Loleta Books, CCMD called about pt had a run of vtach rate of 150. QRS goes into a bundle branch and then bounces back to normal. No new orders at this time.

## 2019-10-16 ENCOUNTER — Encounter: Payer: Self-pay | Admitting: Internal Medicine

## 2019-10-16 DIAGNOSIS — Z515 Encounter for palliative care: Secondary | ICD-10-CM

## 2019-10-16 DIAGNOSIS — U071 COVID-19: Secondary | ICD-10-CM | POA: Diagnosis present

## 2019-10-16 DIAGNOSIS — Z66 Do not resuscitate: Secondary | ICD-10-CM

## 2019-10-16 DIAGNOSIS — Z7189 Other specified counseling: Secondary | ICD-10-CM

## 2019-10-16 LAB — MAGNESIUM: Magnesium: 2 mg/dL (ref 1.7–2.4)

## 2019-10-16 LAB — COMPREHENSIVE METABOLIC PANEL
ALT: 31 U/L (ref 0–44)
AST: 30 U/L (ref 15–41)
Albumin: 2.6 g/dL — ABNORMAL LOW (ref 3.5–5.0)
Alkaline Phosphatase: 75 U/L (ref 38–126)
Anion gap: 8 (ref 5–15)
BUN: 35 mg/dL — ABNORMAL HIGH (ref 8–23)
CO2: 32 mmol/L (ref 22–32)
Calcium: 8.6 mg/dL — ABNORMAL LOW (ref 8.9–10.3)
Chloride: 104 mmol/L (ref 98–111)
Creatinine, Ser: 0.69 mg/dL (ref 0.44–1.00)
GFR calc Af Amer: >60 mL/min (ref >60)
GFR calc non Af Amer: >60 mL/min (ref >60)
Glucose, Bld: 236 mg/dL — ABNORMAL HIGH (ref 70–99)
Potassium: 4.3 mmol/L (ref 3.5–5.1)
Sodium: 144 mmol/L (ref 135–145)
Total Bilirubin: 0.6 mg/dL (ref 0.3–1.2)
Total Protein: 5.8 g/dL — ABNORMAL LOW (ref 6.5–8.1)

## 2019-10-16 LAB — CBC WITH DIFFERENTIAL/PLATELET
Abs Immature Granulocytes: 0.06 10*3/uL (ref 0.00–0.07)
Basophils Absolute: 0 10*3/uL (ref 0.0–0.1)
Basophils Relative: 0 %
Eosinophils Absolute: 0 10*3/uL (ref 0.0–0.5)
Eosinophils Relative: 0 %
HCT: 38.4 % (ref 36.0–46.0)
Hemoglobin: 12.1 g/dL (ref 12.0–15.0)
Immature Granulocytes: 1 %
Lymphocytes Relative: 4 %
Lymphs Abs: 0.3 10*3/uL — ABNORMAL LOW (ref 0.7–4.0)
MCH: 30.3 pg (ref 26.0–34.0)
MCHC: 31.5 g/dL (ref 30.0–36.0)
MCV: 96.2 fL (ref 80.0–100.0)
Monocytes Absolute: 0.5 10*3/uL (ref 0.1–1.0)
Monocytes Relative: 6 %
Neutro Abs: 7.9 10*3/uL — ABNORMAL HIGH (ref 1.7–7.7)
Neutrophils Relative %: 89 %
Platelets: 518 10*3/uL — ABNORMAL HIGH (ref 150–400)
RBC: 3.99 MIL/uL (ref 3.87–5.11)
RDW: 13.4 % (ref 11.5–15.5)
WBC: 8.8 10*3/uL (ref 4.0–10.5)
nRBC: 0 % (ref 0.0–0.2)

## 2019-10-16 LAB — C-REACTIVE PROTEIN: CRP: 1.8 mg/dL — ABNORMAL HIGH (ref <1.0)

## 2019-10-16 LAB — FERRITIN: Ferritin: 194 ng/mL (ref 11–307)

## 2019-10-16 LAB — FIBRIN DERIVATIVES D-DIMER (ARMC ONLY): Fibrin derivatives D-dimer (ARMC): 755.35 ng/mL (FEU) — ABNORMAL HIGH (ref 0.00–499.00)

## 2019-10-16 NOTE — Consult Note (Signed)
Consultation Note Date: 10/16/2019   Patient Name: Shelly Duran  DOB: [birth year]  MRN: [id number]  Age / Sex: 78 y.o., female  PCP: Patient, No Pcp Per Referring Physician: Alberteen Sam, *  Reason for Consultation: Establishing goals of care and Psychosocial/spiritual support  HPI/Patient Profile: 78 y.o. female  with past medical history of advanced dementia, hypothyroidism, COPD, smoker, residential SNF-[facility], recent admission for pneumonia admitted on 10-21-2019 with coronavirus pneumonia with acute hypoxic respiratory failure.   Clinical Assessment and Goals of Care: I have reviewed medical records including EPIC notes, labs and imaging, received report from bedside nursing and attending, and called sons to discuss diagnosis prognosis, GOC, EOL wishes, disposition and options.  Chart reviewed, Covid positive patient.  Conference with bedside nursing staff related to patient condition.  Ms. Brundage has known dementia, and per nursing staff is oriented to self only at this time.  She has had poor by mouth intake, and is quite ill and frail.  Unsure if she is able to make her basic needs known at this time.  There is no family at bedside at this time.  Conference with attending and bedside nursing staff related to patient condition, needs, goals of care.  Call to son, Shelly Duran at [phone]. Theodoro Grist lives in [place].  He asks to call his brother Shelly Duran for conference call.  Onalee Hua calls Shelly Duran and they call PMT to conference related to goals of care.    I introduced Palliative Medicine as specialized medical care for people living with serious illness. It focuses on providing relief from the symptoms and stress of a serious illness. The goal is to improve quality of life for both the patient and the family.  We discussed a brief life review of the patient. Shelly Duran was a resident of  [facility] until recently when she was transferred to [facility].  She has 2 children, Onalee Hua and Shelly Duran.  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  They share that she was divorced from her spouse, but they were close.  Mr. Kuk died approximately 1 year ago.  Family shares that Shelly Duran have reduced by mouth intake due to thyroid disorder and moved into the ALF around that time.  As far as functional and nutritional status, [name] has been in ALF, but had recent transition to LTC at [facility].  Went into ALF dt "weight loss" per family.   We discussed her current illness and what it means in the larger context of her on-going co-morbidities.  We talked in detail about her chronic illness burden. Natural disease trajectory and expectations at EOL were discussed.  I attempted to elicit values and goals of care important to the patient.    The difference between aggressive medical intervention and comfort care was considered in light of the patient's goals of care.  Onalee Hua and Shelly Duran shared that their father had hospice care  about 1 year ago, they are experienced  Advanced directives, concepts specific to code status, artifical feeding and hydration, and rehospitalization were considered and discussed.  Onalee Huaavid and Shelly DanceKeith share that Shelly Duran completed advanced directives over 1 year ago, treat the treatable but allowing natural passing.  We talked about the use of artificial hydration and end-of-life, we also talked about symptom management for people in hospice care.  Hospice services outpatient were explained and offered.  I shared that [facility] would accept Shelly Duran Covid positive.  Questions and concerns were addressed.  We plan for a follow up call tomorrow around noon.   Attending updated.  Plan for 24 hours of IV fluids, 24 hour trial of by mouth.    HCPOA       NEXT OF KIN - sons Lenon OmsDavid Raggio lives in [place]) and Vanessa RalphsKeith Bonnette (lives in [place]).    SUMMARY OF RECOMMENDATIONS   24-48 hours for outcomes Considering choices   Code Status/Advance Care Planning:  DNR  Symptom Management:   Per hospitalist, no additional needs at this time.  Palliative Prophylaxis:   Frequent Pain Assessment, Oral Care and Turn Reposition  Additional Recommendations (Limitations, Scope, Preferences):  Treat the treatable but no CPR, no intubation  Psycho-social/Spiritual:   Desire for further Chaplaincy support:no  Additional Recommendations: Caregiving  Support/Resources and Education on Hospice  Prognosis:   < 2 weeks would not be surprising based on acute health concerns, respiratory status, frailty.   Discharge Planning: To be determined, based on outcomes/family choice.  Advised residential hospice choice would be [facility] dt Covid+.       Primary Diagnoses: Present on Admission: . Acute respiratory disease due to COVID-19 virus . GERD (gastroesophageal reflux disease) . DNR (do not resuscitate) . Hyperthyroidism . COPD (chronic obstructive pulmonary disease) (HCC)   I have reviewed the medical record, interviewed the patient and family, and examined the patient. The following aspects are pertinent.  Past Medical History:  Diagnosis Date  . Alzheimer's dementia (HCC)   . Dementia [facility](HCC)    Social History   Socioeconomic History  . Marital status: Single    Spouse name: Not on file  . Number of children: Not on file  . Years of education: Not on file  . Highest education level: Not on file  Occupational History  . Not on file  Tobacco Use  . Smoking status: Current Every Day Smoker  . Smokeless tobacco: Never Used  Substance and Sexual Activity  . Alcohol use: Never  . Drug use: Never  . Sexual activity: Not on file  Other Topics Concern  . Not on file  Social History Narrative   [facility] at [facility]    Social Determinants of Health   Financial Resource Strain:   . Difficulty of Paying Living Expenses: Not on file  Food Insecurity:   . Worried About Programme researcher, broadcasting/film/videounning Out of Food in the Last Year: Not on file  . Ran Out of Food in the Last Year: Not on file  Transportation Needs:   . Lack of Transportation (Medical): Not on file  . Lack of Transportation (Non-Medical): Not on file  Physical Activity:   . Days of Exercise per Week: Not on file  . Minutes of Exercise per Session: Not on file  Stress:   . Feeling of Stress : Not on file  Social Connections:   . Frequency of Communication with Friends and Family: Not on file  . Frequency of Social Gatherings with Friends and  Family: Not on file  . Attends Religious Services: Not on file  . Active Member of Clubs or Organizations: Not on file  . Attends Banker Meetings: Not on file  . Marital Status: Not on file   Family History  Family history unknown: Yes   Scheduled Meds: . vitamin C  500 mg Oral Daily  . cholecalciferol  1,000 Units Oral Daily  . enoxaparin (LOVENOX) injection  40 mg Subcutaneous Q24H  . feeding supplement (ENSURE ENLIVE)  237 mL Oral BID BM  . guaiFENesin-dextromethorphan  5 mL Oral BID  . methimazole  5 mg Oral Daily  . methylPREDNISolone (SOLU-MEDROL) injection  60 mg Intravenous BID  . multivitamin with minerals  1 tablet Oral Daily  . risperiDONE  1 mg Oral BID  . zinc sulfate  220 mg Oral Daily   Continuous Infusions: . dextrose 5 % and 0.9% NaCl 50 mL/hr at 10/16/19 1000   PRN Meds:.acetaminophen, albuterol, alum & mag hydroxide-simeth, bismuth subsalicylate, chlorpheniramine-HYDROcodone, haloperidol lactate, ipratropium, ondansetron (ZOFRAN) IV, traZODone Medications Prior to Admission:  Prior to Admission medications   Medication Sig Start Date End Date Taking? Authorizing Provider  cholecalciferol (VITAMIN D3) 25 MCG (1000 UT) tablet Take 1,000 Units by mouth daily.   Yes [provider]  famotidine (PEPCID) 20 MG tablet Take 1 tablet (20 mg total) by mouth daily. 10/07/19  Yes Lynn Ito, MD  feeding supplement, ENSURE ENLIVE, (ENSURE ENLIVE) LIQD Take 237 mLs by mouth 2 (two) times daily between meals. 10/06/19  Yes Lynn Ito, MD  guaiFENesin (MUCINEX) 600 MG 12 hr tablet Take 1 tablet (600 mg total) by mouth 2 (two) times daily. 10/06/19  Yes Lynn Ito, MD  methimazole (TAPAZOLE) 5 MG tablet Take 1 tablet (5 mg total) by mouth daily. 09/14/18  Yes Enid Baas, MD  Multiple Vitamin (MULTIVITAMIN WITH MINERALS) TABS tablet Take 1 tablet by mouth daily. 11/02/18  Yes Katha Hamming, MD  predniSONE (DELTASONE) 20 MG tablet Take 1 tablet (20 mg total) by mouth daily with breakfast. 10/09/19  Yes Lynn Ito, MD  risperiDONE (RISPERDAL) 1 MG tablet Take 1 tablet by mouth 2 (two) times daily. May repeat 1 hour after bedtime dose if needed for agitation. 08/18/18  Yes [provider]  traZODone (DESYREL) 50 MG tablet Take 1 tablet by mouth at bedtime as needed for sleep.  09/28/19  Yes [provider]  acetaminophen (TYLENOL) 325 MG tablet Take 650 mg by mouth every 4 (four) hours as needed (for up to 24 hours).    [provider]  alum & mag hydroxide-simeth (MYLANTA) 200-200-20 MG/5ML suspension Take 30 mLs by mouth daily as needed for indigestion or heartburn. Take 30 ml by mouth as needed for 1 dose only     [provider]  bismuth subsalicylate (PEPTO BISMOL) 262 MG/15ML suspension Take 5 mLs by mouth every 12 (twelve) hours as needed for indigestion.     [provider]  chlorpheniramine-HYDROcodone (TUSSIONEX) 10-8 MG/5ML SUER Take 5 mLs by mouth every 12 (twelve) hours as needed for cough. 10/06/19   Lynn Ito, MD  guaiFENesin (ROBITUSSIN) 100 MG/5ML liquid Take 400 mg by mouth every 6 (six) hours as needed for cough.    [provider]  predniSONE (DELTASONE) 20 MG tablet Take 2 tablets (40  mg total) by mouth daily with breakfast. Patient not taking: Reported on 2019-11-09 10/07/19   Lynn Ito, MD   No Known Allergies Review of Systems  Unable to perform  ROS: Dementia    Physical Exam Vitals and nursing note reviewed.  Constitutional:      Comments: Covid positive patient, discussed with bedside nursing     Vital Signs: BP (!) 153/91 (BP Location: Right Arm)   Pulse 97   Temp 98.4 F (36.9 C) (Oral)   Resp 19   Ht 5' 2.01" (1.575 m)   Wt 46.4 kg   SpO2 96%   BMI 18.70 kg/m  Pain Scale: 0-10   Pain Score: 0-No pain   SpO2: SpO2: 96 % O2 Device:SpO2: 96 % O2 Flow Rate: .O2 Flow Rate (L/min): 15 L/min  IO: Intake/output summary:   Intake/Output Summary (Last 24 hours) at 10/16/2019 1258 Last data filed at 10/16/2019 0507 Gross per 24 hour  Intake 0 ml  Output 650 ml  Net -650 ml    LBM: Last BM Date: 10/16/19 Baseline Weight: Weight: 49 kg Most recent weight: Weight: 46.4 kg     Palliative Assessment/Data:   Flowsheet Rows     Most Recent Value  Intake Tab  Referral Department  Hospitalist  Unit at Time of Referral  Med/Surg Unit  Palliative Care Primary Diagnosis  Pulmonary  Date Notified  10/14/19  Palliative Care Type  New Palliative care  Reason for referral  Clarify Goals of Care  Date of Admission  10/07/2019  Date first seen by Palliative Care  10/16/19  # of days Palliative referral response time  2 Day(s)  # of days IP prior to Palliative referral  2  Clinical Assessment  Palliative Performance Scale Score  20%  Pain Max last 24 hours  Not able to report  Pain Min Last 24 hours  Not able to report  Dyspnea Max Last 24 Hours  Not able to report  Dyspnea Min Last 24 hours  Not able to report  Psychosocial & Spiritual Assessment  Palliative Care Outcomes      Time In: 1230 Time Out: 1340 Time Total: 70 minutes  Greater than 50%  of this time was spent counseling and coordinating care related to the above assessment and  plan.  Signed by: Drue Novel, NP   Please contact Palliative Medicine Team phone at [phone] for questions and concerns.  For individual provider: See Shea Evans

## 2019-10-16 NOTE — Progress Notes (Signed)
PROGRESS NOTE    Shelly Duran  VPX:[id number] DOB: 25-Oct-1941 DOA: 09/29/2019 PCP: Patient, No Pcp Per      Brief Narrative:  Mrs. Shelly Duran is a 78 y.o. F with advanced Alzheimer's dementia, SNF-dwelling, hyperthryoidism, COPD and smoking who presents with SOB.  Patient was diagnosed at her facility 3 days prior to admission 12/15.  Over the last several days, she has had worsening shortness of breath, respiratory distress, and cough.  Finally sent to the ER.  In the ER, patient required 6 L oxygen to keep O2 sat 92%.  Chest x-ray showed pneumonia.       Assessment & Plan:  Coronavirus pneumonitis with acute hypoxic respiratory failure Patient presented Covid positive with pneumonia on chest x-ray and severe hypoxia in the setting of the ongoing 2020 COVID-19 pandemic.  Lasix given 12/19, no improvement.    Her O2 requirements trended rapidly up to 15 L.  Inflammatory markers have trended down, but we have been unable to wean oxygen, she desaturates quickly to 75% when she pulled her oxygen off due to confusion, and she has remained confused and somnolent with very minimal p.o. intake.  Creatinine and LFTs stable today. -Continue remdesivir, day 5 of 5 -Continue Solu-Medrol, day 5 -Continue VTE PPx with Lovenox -Continue zinc and Vitamin C -Flutter valve, turn, cough, incentive spirometry q2hrs while awake     Dementia with acute COVID metabolic encephalopathy Failure to thrive Patient diagnosed with dementia in 2015, SLUMS 17/30 at that time, moved into ALF in 2017.    Most recently can ambulate short distances and have short conversations, but oriented to self only.  Currently somnolent, one word answers, does not follow my commands.  Minimal PO intake -Continue Risperdal -Continue gentle IV fluids for now -Consult to Palliative Care --> I feel the patient is failing to thrive, failing to wean from O2 and is unlikely to have recovery of respiratory status and  mentation well enough to eat and drink enough to sustain life; in that context, I believe she has days to weeks to live, and would be a good candidate for residential hospice, if she could be stabilized enough  Ventricular tachycardia In the context of severe hypoxemia. -Mitts to keep her from removing O2 cannula  Moderate protein calorie malnutrition As evidenced by advanced systemic disease, poor muscle mass and fat, BMI 18. -Continue Ensure as tolerated -Nutrition consult  Hyperthyroidism TSH normal  -Continue Tapazole        MDM and disposition: The below labs and imaging reports reviewed and summarized above.  Medication management as above.    The patient was admitted with COVID-19.  She has severe hypoxic respiratory failure and encephalopathy.  At this time we have maximized medical therapy, without improvement in respiratory status or mentation.    Given her limited functional status at baseline (SLUMS 17/30 5 years ago, now lives in memory care, BMI 18) I suspect that her prognosis is exceedingly poor.  We will continue current management, but have no options for escalating care if she deterioates, and so will enlist Palliative care assistance.  Son understands likely outcome, in agreement with current mgmt. I recommend we discontinue IV fluids in the next 24 hours, monitor her oral intake.  If she is unable to consume enough fluids and nutrition to sustain herself, I recommend we transition to inpatient hospice, or provide comfort cares expect an in-hospital death.     DVT prophylaxis: Lovenox Code Status: DO NOT RESUSCITATE Family Communication: Son  by phone     Antimicrobials:   Remdesivir 12/18>> 12/22  Culture data:   12/18 blood culture-no growth       Subjective: No new fever.  Patient is increasingly somnolent.  Unable to wean oxygen.  No respiratory distress, vomiting.  No agitation.      Objective: Vitals:   10/15/19 0811 10/15/19 2041  10/16/19 0506 10/16/19 0507  BP: 130/79 133/84 (!) 159/96   Pulse: 88 84 91 85  Resp:  (!) 24 (!) 24   Temp:  98.2 F (36.8 C) (!) 97.5 F (36.4 C)   TempSrc:  Oral Oral   SpO2: 90% 98% (!) 84% 93%  Weight:      Height:        Intake/Output Summary (Last 24 hours) at 10/16/2019 1241 Last data filed at 10/16/2019 0507 Gross per 24 hour  Intake 0 ml  Output 650 ml  Net -650 ml   Filed Weights   10/01/2019 1042 10/13/19 1158  Weight: 49 kg 46.4 kg    Examination: General appearance: Frail elderly female, alert and in no acute distress HEENT: Conjunctival, lids and lashes normal.  No nasal deformity, discharge, or epistaxis.  Lips dry, edentulous, tongue dry. Skin: Skin warm and dry without suspicious rashes on the face, neck, upper chest, back, or arms.. Cardiac: Tachycardic, regular, no murmurs appreciated, no lower extremity edema Respiratory: Tachypneic, respirations shallow, lungs clear without rales or wheezes. Abdomen: Abdomen soft without grimace to palpation, no ascites or distention. MSK: Diffuse loss of subcutaneous muscle mass and fat, Neuro: Awake, opens eyes to voice, but does not make eye contact.  Does not follow commands.  Does not respond verbally to questions.  Closes her eyes and goes back to sleep. Psych: Unable to assess           Data Reviewed: I have personally reviewed following labs and imaging studies:  CBC: Recent Labs  Lab 10/10/2019 1153 10/13/19 0631 10/14/19 0449 10/15/19 0540 10/16/19 0632  WBC 11.8* 6.3 10.5 9.2 8.8  NEUTROABS 10.6* 5.2 9.2* 8.5* 7.9*  HGB 12.4 12.1 12.6 13.2 12.1  HCT 38.6 38.1 38.3 41.8 38.4  MCV 95.5 95.3 91.4 95.0 96.2  PLT 421* 436* 463* 600* 518*   Basic Metabolic Panel: Recent Labs  Lab 10/15/2019 1153 10/13/19 0631 10/14/19 0449 10/15/19 0540 10/16/19 0632  NA 140 141 141 143 144  K 4.1 4.2 4.6 4.6 4.3  CL 100 102 99 102 104  CO2 26 25 34* 27 32  GLUCOSE 143* 135* 167* 228* 236*  BUN 20 31* 44*  39* 35*  CREATININE 0.82 0.82 0.91 0.67 0.69  CALCIUM 8.3* 8.5* 8.6* 8.8* 8.6*  MG  --  2.1 2.0 2.0 2.0   GFR: Estimated Creatinine Clearance: 43.8 mL/min (by C-G formula based on SCr of 0.69 mg/dL). Liver Function Tests: Recent Labs  Lab 10/05/2019 1153 10/13/19 0631 10/14/19 0449 10/15/19 0540 10/16/19 0632  AST 40 34 33 34 30  ALT 30 28 31 31 31   ALKPHOS 76 67 71 76 75  BILITOT 0.5 0.6 0.5 0.6 0.6  PROT 6.4* 6.0* 6.1* 6.4* 5.8*  ALBUMIN 2.9* 2.6* 2.6* 2.8* 2.6*   No results for input(s): LIPASE, AMYLASE in the last 168 hours. No results for input(s): AMMONIA in the last 168 hours. Coagulation Profile: No results for input(s): INR, PROTIME in the last 168 hours. Cardiac Enzymes: No results for input(s): CKTOTAL, CKMB, CKMBINDEX, TROPONINI in the last 168 hours. BNP (last 3 results) No results  for input(s): PROBNP in the last 8760 hours. HbA1C: No results for input(s): HGBA1C in the last 72 hours. CBG: No results for input(s): GLUCAP in the last 168 hours. Lipid Profile: No results for input(s): CHOL, HDL, LDLCALC, TRIG, CHOLHDL, LDLDIRECT in the last 72 hours. Thyroid Function Tests: No results for input(s): TSH, T4TOTAL, FREET4, T3FREE, THYROIDAB in the last 72 hours. Anemia Panel: Recent Labs    10/15/19 0540 10/16/19 0632  FERRITIN 214 194   Urine analysis:    Component Value Date/Time   COLORURINE STRAW (A) 10/02/2019 1935   APPEARANCEUR CLEAR (A) 10/02/2019 1935   LABSPEC 1.011 10/02/2019 1935   PHURINE 6.0 10/02/2019 1935   GLUCOSEU >=500 (A) 10/02/2019 1935   HGBUR NEGATIVE 10/02/2019 1935   BILIRUBINUR NEGATIVE 10/02/2019 1935   KETONESUR NEGATIVE 10/02/2019 1935   PROTEINUR NEGATIVE 10/02/2019 1935   NITRITE NEGATIVE 10/02/2019 1935   LEUKOCYTESUR NEGATIVE 10/02/2019 1935   Sepsis Labs: @LABRCNTIP (procalcitonin:4,lacticacidven:4)  ) Recent Results (from the past 240 hour(s))  SARS CORONAVIRUS 2 (TAT 6-24 HRS) Nasopharyngeal Nasopharyngeal Swab      Status: Abnormal   Collection Time: 10/10/2019 11:53 AM   Specimen: Nasopharyngeal Swab  Result Value Ref Range Status   SARS Coronavirus 2 POSITIVE (A) NEGATIVE Final    Comment: RESULT CALLED TO, READ BACK BY AND VERIFIED WITH: N.MERRIUM RN 2057 10/19/2019 MCCORMICK K (NOTE) SARS-CoV-2 target nucleic acids are DETECTED. The SARS-CoV-2 RNA is generally detectable in upper and lower respiratory specimens during the acute phase of infection. Positive results are indicative of the presence of SARS-CoV-2 RNA. Clinical correlation with patient history and other diagnostic information is  necessary to determine patient infection status. Positive results do not rule out bacterial infection or co-infection with other viruses.  The expected result is Negative. Fact Sheet for Patients: HairSlick.nohttps://www.fda.gov/media/138098/download Fact Sheet for Healthcare Providers: quierodirigir.comhttps://www.fda.gov/media/138095/download This test is not yet approved or cleared by the [place] FDA and  has been authorized for detection and/or diagnosis of SARS-CoV-2 by FDA under an Emergency Use Authorization (EUA). This EUA will remain  in effect (meaning this test can be used) fo r the duration of the COVID-19 declaration under Section 564(b)(1) of the Act, 21 U.S.C. section 360bbb-3(b)(1), unless the authorization is terminated or revoked sooner. Performed at [facility], [address] [place], [place] [address]   Blood Culture (routine x 2)     Status: None (Preliminary result)   Collection Time: 10/01/2019 11:53 AM   Specimen: BLOOD RIGHT WRIST  Result Value Ref Range Status   Specimen Description BLOOD RIGHT WRIST  Final   Special Requests   Final    BOTTLES DRAWN AEROBIC AND ANAEROBIC Blood Culture adequate volume   Culture   Final    NO GROWTH 4 DAYS Performed at [facility], [address] [place], [place] [address]    Report Status PENDING  Incomplete  Blood Culture (routine x 2)      Status: None (Preliminary result)   Collection Time: 10/13/2019 11:53 AM   Specimen: BLOOD LEFT ARM  Result Value Ref Range Status   Specimen Description BLOOD LEFT ARM  Final   Special Requests   Final    BOTTLES DRAWN AEROBIC AND ANAEROBIC Blood Culture adequate volume   Culture   Final    NO GROWTH 4 DAYS Performed at [facility], [address] [place], [place] [address]    Report Status PENDING  Incomplete  MRSA PCR Screening     Status:  None   Collection Time: 10/13/19  7:55 PM   Specimen: Nasal Mucosa; Nasopharyngeal  Result Value Ref Range Status   MRSA by PCR NEGATIVE NEGATIVE Final    Comment:        The GeneXpert MRSA Assay (FDA approved for NASAL specimens only), is one component of a comprehensive MRSA colonization surveillance program. It is not intended to diagnose MRSA infection nor to guide or monitor treatment for MRSA infections. Performed at [facility], [address], [place], [place] [address]          Radiology Studies: No results found.      Scheduled Meds: . vitamin C  500 mg Oral Daily  . cholecalciferol  1,000 Units Oral Daily  . enoxaparin (LOVENOX) injection  40 mg Subcutaneous Q24H  . feeding supplement (ENSURE ENLIVE)  237 mL Oral BID BM  . guaiFENesin-dextromethorphan  5 mL Oral BID  . methimazole  5 mg Oral Daily  . methylPREDNISolone (SOLU-MEDROL) injection  60 mg Intravenous BID  . multivitamin with minerals  1 tablet Oral Daily  . risperiDONE  1 mg Oral BID  . zinc sulfate  220 mg Oral Daily   Continuous Infusions: . dextrose 5 % and 0.9% NaCl 50 mL/hr at 10/16/19 1000     LOS: 4 days    Time spent: 25 minutes      Alberteen Sam, MD [facility] 10/16/2019, 12:41 PM     Please page through AMION:  [email or web address] Contact charge nurse for password If 7PM-7AM, please contact night-coverage

## 2019-10-17 LAB — CBC WITH DIFFERENTIAL/PLATELET
Abs Immature Granulocytes: 0.29 10*3/uL — ABNORMAL HIGH (ref 0.00–0.07)
Basophils Absolute: 0 10*3/uL (ref 0.0–0.1)
Basophils Relative: 0 %
Eosinophils Absolute: 0 10*3/uL (ref 0.0–0.5)
Eosinophils Relative: 0 %
HCT: 41.1 % (ref 36.0–46.0)
Hemoglobin: 12.7 g/dL (ref 12.0–15.0)
Immature Granulocytes: 3 %
Lymphocytes Relative: 1 %
Lymphs Abs: 0.1 10*3/uL — ABNORMAL LOW (ref 0.7–4.0)
MCH: 30.3 pg (ref 26.0–34.0)
MCHC: 30.9 g/dL (ref 30.0–36.0)
MCV: 98.1 fL (ref 80.0–100.0)
Monocytes Absolute: 0.7 10*3/uL (ref 0.1–1.0)
Monocytes Relative: 6 %
Neutro Abs: 10 10*3/uL — ABNORMAL HIGH (ref 1.7–7.7)
Neutrophils Relative %: 90 %
Platelets: 599 10*3/uL — ABNORMAL HIGH (ref 150–400)
RBC: 4.19 MIL/uL (ref 3.87–5.11)
RDW: 13.6 % (ref 11.5–15.5)
WBC: 11.1 10*3/uL — ABNORMAL HIGH (ref 4.0–10.5)
nRBC: 0 % (ref 0.0–0.2)

## 2019-10-17 LAB — CULTURE, BLOOD (ROUTINE X 2)
Culture: NO GROWTH
Culture: NO GROWTH
Special Requests: ADEQUATE
Special Requests: ADEQUATE

## 2019-10-17 LAB — COMPREHENSIVE METABOLIC PANEL
ALT: 32 U/L (ref 0–44)
AST: 28 U/L (ref 15–41)
Albumin: 2.9 g/dL — ABNORMAL LOW (ref 3.5–5.0)
Alkaline Phosphatase: 77 U/L (ref 38–126)
Anion gap: 12 (ref 5–15)
BUN: 40 mg/dL — ABNORMAL HIGH (ref 8–23)
CO2: 34 mmol/L — ABNORMAL HIGH (ref 22–32)
Calcium: 8.5 mg/dL — ABNORMAL LOW (ref 8.9–10.3)
Chloride: 102 mmol/L (ref 98–111)
Creatinine, Ser: 0.75 mg/dL (ref 0.44–1.00)
GFR calc Af Amer: >60 mL/min (ref >60)
GFR calc non Af Amer: >60 mL/min (ref >60)
Glucose, Bld: 279 mg/dL — ABNORMAL HIGH (ref 70–99)
Potassium: 4.4 mmol/L (ref 3.5–5.1)
Sodium: 148 mmol/L — ABNORMAL HIGH (ref 135–145)
Total Bilirubin: 0.5 mg/dL (ref 0.3–1.2)
Total Protein: 6.4 g/dL — ABNORMAL LOW (ref 6.5–8.1)

## 2019-10-17 LAB — FERRITIN: Ferritin: 175 ng/mL (ref 11–307)

## 2019-10-17 LAB — FIBRIN DERIVATIVES D-DIMER (ARMC ONLY): Fibrin derivatives D-dimer (ARMC): 1242 ng/mL (FEU) — ABNORMAL HIGH (ref 0.00–499.00)

## 2019-10-17 LAB — MAGNESIUM: Magnesium: 2.3 mg/dL (ref 1.7–2.4)

## 2019-10-17 MED ORDER — METOPROLOL TARTRATE 5 MG/5ML IV SOLN: 10.0000 mg | INTRAVENOUS | Status: DC | PRN

## 2019-10-17 MED ORDER — METOPROLOL TARTRATE 5 MG/5ML IV SOLN
10.0000 mg | Freq: Once | INTRAVENOUS | Status: AC
  Administered 2019-10-17: 10 mg via INTRAVENOUS
  Filled 2019-10-17: qty 10

## 2019-10-17 MED ORDER — LORAZEPAM 2 MG/ML IJ SOLN
0.5000 mg | INTRAMUSCULAR | Status: DC | PRN
  Administered 2019-10-17: 1 mg via INTRAVENOUS
  Filled 2019-10-17: qty 1

## 2019-10-17 MED ORDER — MORPHINE SULFATE (PF) 2 MG/ML IV SOLN
1.0000 mg | INTRAVENOUS | Status: DC | PRN
  Administered 2019-10-17: 4 mg via INTRAVENOUS
  Administered 2019-10-17: 1 mg via INTRAVENOUS
  Administered 2019-10-17 – 2019-10-18 (×2): 2 mg via INTRAVENOUS
  Filled 2019-10-17: qty 1
  Filled 2019-10-17: qty 2
  Filled 2019-10-17 (×2): qty 1

## 2019-10-17 MED ORDER — FUROSEMIDE 10 MG/ML IJ SOLN
40.0000 mg | Freq: Once | INTRAMUSCULAR | Status: AC
  Administered 2019-10-17: 40 mg via INTRAVENOUS
  Filled 2019-10-17: qty 4

## 2019-10-17 MED ORDER — METOPROLOL TARTRATE 25 MG PO TABS
12.5000 mg | ORAL_TABLET | Freq: Two times a day (BID) | ORAL | Status: DC
  Administered 2019-10-17: 12.5 mg via ORAL
  Filled 2019-10-17: qty 1

## 2019-10-17 NOTE — Progress Notes (Signed)
Medications given and O2 removed at 1555 per NP and comfort care order.  RN and NT sitting with patient for extended period of time.  Patient is resting quietly  Continue to monitor for comfort needs.

## 2019-10-17 NOTE — Progress Notes (Signed)
Spoke with Shelly Duran regarding patient's 18 beat run of SVT with a heart rate of 170. Randol Kern called respiratory to add a non-rebreather to the HFNC.She believes that the hypoxia is causing the irregular, elevated heart rate.  Will continue to monitor. Shelly Duran

## 2019-10-17 NOTE — Progress Notes (Signed)
RN reports patient is having runs of SVT an continues on 15 L HFNC.  Review of chart and bedside review of rhythm, patient with SR with PAV;s in 90's. However rate increases with brief periods of desaturation.  Given max oxygen therapy of HFNC and not wanting progressive care to intubation, augmentation  HFNC with NRB per RT recommendation.  BP also elevated with + fluid balance. Ordering IV lasix and oral metoprolol

## 2019-10-17 NOTE — Progress Notes (Signed)
Palliative: Chart reviewed, detailed discussion with attending, bedside nursing staff related to patient decline, needs.  Shelly Duran remains critically ill, and has worsened overnight.  Her respiratory status has diminished and she is now on high flow nasal cannula and nonrebreather, saturations range 93 to 99%.  Her heart rate is also increased sustained 150s to 170s.  Orders for as needed metoprolol for heart rate given.  Call to son, Shelly Duran at [phone].  Shelly Duran conferences in his brother Shelly Duran.  We talked about Shelly Duran decline overnight including her worsening respiratory status and increased heart rate.  We talked about comfort measures, what this would look like and feel like.  Family states that they would like to enact comfort measures at this time, understanding that time is short.  We talked about on burdening Shelly Duran from treatments that are not changing anything and may cause her discomfort such as but not limited to IV fluids, needlesticks, oxygen.  Family is reassured that the medical team will work diligently to ensure that Shelly Duran has comfort and dignity.  Plan:    FULL COMFORT care, anticipate hospital death.  Medications adjusted for comfort  55 minutes, extended time  Quinn Axe, NP Palliative Medicine Team Team Phone # [phone] Greater than 50% of this time was spent counseling and coordinating care related to the above assessment and plan.

## 2019-10-17 NOTE — Progress Notes (Signed)
Called patient's son, Shanon Brow and gave him an update on the patient's status. Patient in bed resting quietly. Will continue to monitor patient and make comfortable.

## 2019-10-17 NOTE — Progress Notes (Signed)
Spoke to [employer] regarding patient's high blood pressure- 160/111. Randol Kern placed orders for 12.5 mg of lopressor and IV lasix 40mg  once. Will continue to monitor.  Christene Slates  10/17/2019  4:45 AM

## 2019-10-17 NOTE — Progress Notes (Signed)
Pt was placed on NRB in addition to the 15L HFNC that is already in place per NP Randol Kern. Pt SPO2 99% at this time.

## 2019-10-17 NOTE — Progress Notes (Signed)
PROGRESS NOTE    Shelly Duran  HBZ:[id number] DOB: July 17, 1941 DOA: October 19, 2019 PCP: Patient, No Pcp Per    Brief Narrative:  Mrs. Lorson is a 78 y.o. Caucasian F with advanced Alzheimer's dementia, SNF-dwelling, hyperthryoidism, COPD and smoking who presents with SOB.  Patient was diagnosed at her facility 3 days prior to admission 12/15.  Over the last several days, she has had worsening shortness of breath, respiratory distress, and cough.  Finally sent to the ER.  In the ER, patient required 6 L oxygen to keep O2 sat 92%.  Chest x-ray showed pneumonia.   Assessment & Plan:  Coronavirus pneumonitis with acute hypoxic respiratory failure Patient presented Covid positive with pneumonia on chest x-ray and severe hypoxia in the setting of the ongoing 2020 COVID-19 pandemic.  Lasix given 12/19, no improvement.    Her O2 requirements trended rapidly up to 15 L.  Inflammatory markers have trended down, but we have been unable to wean oxygen, she desaturates quickly to 75% when she pulled her oxygen off due to confusion, and she has remained confused and somnolent with very minimal p.o. intake. Completed remdesivir 5 days. --Palliative consultant spoke with both sons extensively today and came to the agreement for comfort care.  Comfort care orders placed, with IV morphine for air hunger and IV ativan for anxiety, and suppl O2 was removed in order to not prolong life and suffering, with the sons' permission.  Dementia with acute COVID metabolic encephalopathy Failure to thrive Patient diagnosed with dementia in 2015, SLUMS 17/30 at that time, moved into ALF in 2017.    Most recently can ambulate short distances and have short conversations, but oriented to self only.  Currently somnolent, unresponsive, not eating/drinking. --Pt made comfort care today, see above.  Ventricular tachycardia In the context of severe hypoxemia.  Moderate protein calorie malnutrition As evidenced by  advanced systemic disease, poor muscle mass and fat, BMI 18. --Pt made comfort care today, see above.  Hyperthyroidism TSH normal  D/c Tapazole       DVT prophylaxis: Lovenox Code Status: DNR/Comfort Care Family Communication: Sons updated by phone by palliative care Disposition:  Will remain inpatient until pt passes.   Antimicrobials:   Remdesivir 12/18>> 12/22  Culture data:   12/18 blood culture-no growth    Subjective: Pt was unresponsive today, just staring into space.  Still on High Flow 15L.  Also tachycardic.  Not eating or drinking.  Palliative consultant spoke with both sons extensively and came to the agreement for comfort care.  Comfort care orders placed, with IV morphine for air hunger and IV ativan for anxiety, and suppl O2 was removed in order to not prolong life and suffering, with the sons' permission.   Objective: Vitals:   10/16/19 2039 10/17/19 0134 10/17/19 0157 10/17/19 0650  BP: (!) 150/101 (!) 160/111  (!) 161/100  Pulse: (!) 108 (!) 106  (!) 112  Resp:      Temp: 98.4 F (36.9 C)     TempSrc: Oral     SpO2: 93%  99%   Weight:      Height:        Intake/Output Summary (Last 24 hours) at 10/17/2019 1600 Last data filed at 10/17/2019 1300 Gross per 24 hour  Intake 310 ml  Output 1100 ml  Net -790 ml   Filed Weights   2019/10/19 1042 10/13/19 1158  Weight: 49 kg 46.4 kg    Examination: Constitutional: NAD, unresponsive HEENT: conjunctivae and lids normal,  eyes open, staring into space CV: RRR tachycardic. Distal pulses +2.  No cyanosis.   RESP: shallow rapid breaths, on 15L GI: +BS, NTND Extremities: No effusions, edema, or tenderness in BLE SKIN: warm, dry and intact   Data Reviewed: I have personally reviewed following labs and imaging studies:  CBC: Recent Labs  Lab 10/13/19 0631 10/14/19 0449 10/15/19 0540 10/16/19 0632 10/17/19 0707  WBC 6.3 10.5 9.2 8.8 11.1*  NEUTROABS 5.2 9.2* 8.5* 7.9* 10.0*  HGB 12.1 12.6 13.2  12.1 12.7  HCT 38.1 38.3 41.8 38.4 41.1  MCV 95.3 91.4 95.0 96.2 98.1  PLT 436* 463* 600* 518* 673*   Basic Metabolic Panel: Recent Labs  Lab 10/13/19 0631 10/14/19 0449 10/15/19 0540 10/16/19 0632 10/17/19 0707  NA 141 141 143 144 148*  K 4.2 4.6 4.6 4.3 4.4  CL 102 99 102 104 102  CO2 25 34* 27 32 34*  GLUCOSE 135* 167* 228* 236* 279*  BUN 31* 44* 39* 35* 40*  CREATININE 0.82 0.91 0.67 0.69 0.75  CALCIUM 8.5* 8.6* 8.8* 8.6* 8.5*  MG 2.1 2.0 2.0 2.0 2.3   GFR: Estimated Creatinine Clearance: 43.8 mL/min (by C-G formula based on SCr of 0.75 mg/dL). Liver Function Tests: Recent Labs  Lab 10/13/19 0631 10/14/19 0449 10/15/19 0540 10/16/19 0632 10/17/19 0707  AST 34 33 34 30 28  ALT 28 31 31 31  32  ALKPHOS 67 71 76 75 77  BILITOT 0.6 0.5 0.6 0.6 0.5  PROT 6.0* 6.1* 6.4* 5.8* 6.4*  ALBUMIN 2.6* 2.6* 2.8* 2.6* 2.9*   No results for input(s): LIPASE, AMYLASE in the last 168 hours. No results for input(s): AMMONIA in the last 168 hours. Coagulation Profile: No results for input(s): INR, PROTIME in the last 168 hours. Cardiac Enzymes: No results for input(s): CKTOTAL, CKMB, CKMBINDEX, TROPONINI in the last 168 hours. BNP (last 3 results) No results for input(s): PROBNP in the last 8760 hours. HbA1C: No results for input(s): HGBA1C in the last 72 hours. CBG: No results for input(s): GLUCAP in the last 168 hours. Lipid Profile: No results for input(s): CHOL, HDL, LDLCALC, TRIG, CHOLHDL, LDLDIRECT in the last 72 hours. Thyroid Function Tests: No results for input(s): TSH, T4TOTAL, FREET4, T3FREE, THYROIDAB in the last 72 hours. Anemia Panel: Recent Labs    10/16/19 0632 10/17/19 0707  FERRITIN 194 175   Urine analysis:    Component Value Date/Time   COLORURINE STRAW (A) 10/02/2019 1935   APPEARANCEUR CLEAR (A) 10/02/2019 1935   LABSPEC 1.011 10/02/2019 1935   PHURINE 6.0 10/02/2019 1935   GLUCOSEU >=500 (A) 10/02/2019 1935   HGBUR NEGATIVE 10/02/2019 1935     BILIRUBINUR NEGATIVE 10/02/2019 1935   KETONESUR NEGATIVE 10/02/2019 1935   PROTEINUR NEGATIVE 10/02/2019 1935   NITRITE NEGATIVE 10/02/2019 1935   LEUKOCYTESUR NEGATIVE 10/02/2019 1935   Sepsis Labs: @LABRCNTIP (procalcitonin:4,lacticacidven:4)  ) Recent Results (from the past 240 hour(s))  SARS CORONAVIRUS 2 (TAT 6-24 HRS) Nasopharyngeal Nasopharyngeal Swab     Status: Abnormal   Collection Time: Oct 14, 2019 11:53 AM   Specimen: Nasopharyngeal Swab  Result Value Ref Range Status   SARS Coronavirus 2 POSITIVE (A) NEGATIVE Final    Comment: RESULT CALLED TO, READ BACK BY AND VERIFIED WITH: N.MERRIUM RN 2057 October 14, 2019 MCCORMICK K (NOTE) SARS-CoV-2 target nucleic acids are DETECTED. The SARS-CoV-2 RNA is generally detectable in upper and lower respiratory specimens during the acute phase of infection. Positive results are indicative of the presence of SARS-CoV-2 RNA. Clinical correlation with patient  history and other diagnostic information is  necessary to determine patient infection status. Positive results do not rule out bacterial infection or co-infection with other viruses.  The expected result is Negative. Fact Sheet for Patients: HairSlick.nohttps://www.fda.gov/media/138098/download Fact Sheet for Healthcare Providers: quierodirigir.comhttps://www.fda.gov/media/138095/download This test is not yet approved or cleared by the [place] FDA and  has been authorized for detection and/or diagnosis of SARS-CoV-2 by FDA under an Emergency Use Authorization (EUA). This EUA will remain  in effect (meaning this test can be used) fo r the duration of the COVID-19 declaration under Section 564(b)(1) of the Act, 21 U.S.C. section 360bbb-3(b)(1), unless the authorization is terminated or revoked sooner. Performed at [facility], [address] [place], [place] [address]   Blood Culture (routine x 2)     Status: None   Collection Time: 10/17/2019 11:53 AM   Specimen: BLOOD RIGHT WRIST  Result Value  Ref Range Status   Specimen Description BLOOD RIGHT WRIST  Final   Special Requests   Final    BOTTLES DRAWN AEROBIC AND ANAEROBIC Blood Culture adequate volume   Culture   Final    NO GROWTH 5 DAYS Performed at [facility], [address] [place], [place] [address]    Report Status 10/17/2019 FINAL  Final  Blood Culture (routine x 2)     Status: None   Collection Time: 10/03/2019 11:53 AM   Specimen: BLOOD LEFT ARM  Result Value Ref Range Status   Specimen Description BLOOD LEFT ARM  Final   Special Requests   Final    BOTTLES DRAWN AEROBIC AND ANAEROBIC Blood Culture adequate volume   Culture   Final    NO GROWTH 5 DAYS Performed at [facility], [address] [place], [place] [address]    Report Status 10/17/2019 FINAL  Final  MRSA PCR Screening     Status: None   Collection Time: 10/13/19  7:55 PM   Specimen: Nasal Mucosa; Nasopharyngeal  Result Value Ref Range Status   MRSA by PCR NEGATIVE NEGATIVE Final    Comment:        The GeneXpert MRSA Assay (FDA approved for NASAL specimens only), is one component of a comprehensive MRSA colonization surveillance program. It is not intended to diagnose MRSA infection nor to guide or monitor treatment for MRSA infections. Performed at [facility], [address] [place], [place] [address]          Radiology Studies: No results found.      Scheduled Meds:  Continuous Infusions:    LOS: 5 days    Time spent: 25 minutes      Darlin Priestlyina Eilyn Polack, MD [facility] 10/17/2019, 4:00 PM     Please page through AMION:  [email or web address] Contact charge nurse for password If 7PM-7AM, please contact night-coverage

## 2019-10-18 LAB — C-REACTIVE PROTEIN: CRP: 1.1 mg/dL — ABNORMAL HIGH (ref <1.0)

## 2019-10-26 NOTE — Progress Notes (Signed)
Several calls from Shelly Duran [phone] regarding status of patient, "Has she died yet"?  RN informed Shelly Duran that the patient's nurse was not able to take call at this time, but she would return her call.  Joseph Art was very belligerent, accusatory and disrespectful to and of staff trying to assist her with obtaining information to her satisfaction.  Rufina Falco, NP was requested to call Renee up dates.

## 2019-10-26 NOTE — Discharge Summary (Signed)
Death Summary  Shelly Duran EAV:[id number] DOB: Jan 20, 1941 DOA: Oct 17, 2019  PCP: Patient, No Pcp Per  Admit date: 10-17-19 Date of Death: October 23, 2019 Time of Death: 5:00 am Notification: Patient, No Pcp Per   History of present illness:  Shelly Duran is a 79 y.o. Caucasian female with advanced Alzheimer's dementia, SNF-dwelling, hyperthryoidism, COPD and smoking who presented with SOB.  Patient was diagnosed with COVID on 10/09/19 at her facility 3 days prior to admission.  In the ER, patient required 6 L oxygen to keep O2 sat 92%.  Chest x-ray showed pneumonia.  Despite treatment with 5 days of Remdesivir and steroids, pt had no improvement, and O2 requirement rapidly trended up to 15L [place].  Pt desat quickly when she pulled her oxygen off due to confusion, and she remained confused and somnolent with very minimal p.o. intake.  Palliative consultant and Dr. Maryfrances Bunnell spoke with both sons extensively ([place] of [place]) and came to the agreement for Comfort Care.  Comfort care orders placed, with IV morphine for air hunger and IV ativan for anxiety, and suppl O2 was removed afternoon of 10/17/19 in order to not prolong life and suffering, with the sons' permission.  Pt passed away morning of 2019-08-23.  Final Diagnoses:  # Comfort Care status # Acute hypoxic respiratory failure 2/2 COVID pneumonia # Acute COVID metabolic encephalopathy # Advanced dementia # Failure to thrive # Ventricular tachycardia # Moderate protein calorie malnutrition # Hx of COPD   The results of significant diagnostics from this hospitalization (including imaging, microbiology, ancillary and laboratory) are listed below for reference.    Significant Diagnostic Studies: CT Head Wo Contrast  Result Date: 10/02/2019 CLINICAL DATA:  79 year old female with altered mental status and altered level of consciousness. EXAM: CT HEAD WITHOUT CONTRAST TECHNIQUE: Contiguous axial images were obtained from  the base of the skull through the vertex without intravenous contrast. COMPARISON:  None. FINDINGS: Brain: No evidence of acute infarction, hemorrhage, hydrocephalus, extra-axial collection or mass lesion/mass effect. Atrophy, mild chronic small-vessel white matter ischemic changes remote LEFT caudate lacunar infarct noted. Vascular: No hyperdense vessel or unexpected calcification. Skull: Normal. Negative for fracture or focal lesion. Sinuses/Orbits: No acute finding. Other: None. IMPRESSION: 1. No evidence of acute intracranial abnormality. 2. Atrophy, mild chronic small-vessel white matter ischemic changes and remote LEFT caudate infarct. Electronically Signed   By: Harmon Pier M.D.   On: 10/02/2019 20:19   CT ANGIO CHEST PE W OR WO CONTRAST  Result Date: 10/03/2019 CLINICAL DATA:  Shortness of breath. Dementia patient with acute onset of respiratory distress and hypoxia. EXAM: CT ANGIOGRAPHY CHEST WITH CONTRAST TECHNIQUE: Multidetector CT imaging of the chest was performed using the standard protocol during bolus administration of intravenous contrast. Multiplanar CT image reconstructions and MIPs were obtained to evaluate the vascular anatomy. CONTRAST:  75mL OMNIPAQUE IOHEXOL 350 MG/ML SOLN COMPARISON:  Radiograph yesterday. Chest CT 10/28/2018 FINDINGS: Cardiovascular: There are no filling defects within the pulmonary arteries to suggest pulmonary embolus. Atherosclerosis of the thoracic aorta. No aortic dissection or aneurysm. Descending thoracic aorta is tortuous. There are coronary artery calcifications. Mild cardiomegaly with right heart dilatation. Contrast refluxes into the hepatic veins and IVC. No pericardial effusion. Mediastinum/Nodes: No enlarged mediastinal or hilar lymph nodes. Patulous mid esophagus. Thyroid nodules which were previously evaluated by ultrasound. Lungs/Pleura: Moderate to advanced emphysema. Dependent atelectasis in both lower lobes. No pleural fluid. Minimal subpleural  scarring in the right middle lobe. Improved central bronchial thickening from prior exam. There is  a 6 x 6 mm nodule in the superior segment of the left lower lobe, series 6, image 42. This with likely present on prior exam but was previously obscured by adjacent atelectasis. There is dependent debris within the trachea at the thoracic inlet. Upper Abdomen: Contrast refluxes into the hepatic veins and IVC. Per abdominal aortic tortuosity. Musculoskeletal: Exaggerated thoracic kyphosis. Right anterior lower rib fractures are likely subacute with some surrounding callus formation. No acute osseous abnormality. Review of the MIP images confirms the above findings. IMPRESSION: 1. No pulmonary embolus. 2. Mild cardiomegaly with right heart dilatation and reflux of contrast into the hepatic veins and IVC, consistent with elevated right heart pressures. 3. Right anterior lower rib fractures are likely subacute with some surrounding callus formation. 4. Left lobe 6 mm pulmonary nodule, possibly present on exam 11 months ago but previously obscured. Non-contrast chest CT at 6-12 months is recommended. If the nodule is stable at time of repeat CT, then future CT at 18-24 months (from today's scan) is considered optional for low-risk patients, but is recommended for high-risk patients. This recommendation follows the consensus statement: Guidelines for Management of Incidental Pulmonary Nodules Detected on CT Images: From the Fleischner Society 2017; Radiology 2017; 284:228-243. 5. Debris/mucus in the dependent trachea. 6. Emphysema. 7. Aortic atherosclerosis and coronary artery calcifications. Aortic Atherosclerosis (ICD10-I70.0) and Emphysema (ICD10-J43.9). Electronically Signed   By: Narda Rutherford M.D.   On: 10/03/2019 05:07   DG Chest Port 1 View  Result Date: Oct 25, 2019 CLINICAL DATA:  COVID-19 positive 10/09/2019.  Hypoxia. EXAM: PORTABLE CHEST 1 VIEW COMPARISON:  10/02/2019 FINDINGS: Patient is rotated to the  left. Lungs are adequately inflated. There is mild emphysematous disease over the upper lungs. There is moderate opacification over the left mid to lower lung and left retrocardiac region. Findings may be due to effusion with atelectasis as infection in the left base is possible. Minimal prominence of the left perihilar markings. Cardiomediastinal silhouette and remainder of the exam is unchanged. IMPRESSION: New opacification over the left base/retrocardiac region which may be due to effusion with atelectasis although infection is possible. Mild emphysematous disease. Electronically Signed   By: Elberta Fortis M.D.   On: 25-Oct-2019 11:39   DG Chest Port 1 View  Result Date: 10/02/2019 CLINICAL DATA:  Respiratory distress. EXAM: PORTABLE CHEST 1 VIEW COMPARISON:  October 28, 2018 FINDINGS: The heart size remains enlarged. Emphysematous changes are noted bilaterally. The lungs are hyperexpanded. There is an opacity at the left lung base which may represent atelectasis or infiltrate in combination with a small left-sided pleural effusion. There is no pneumothorax. IMPRESSION: 1. Stable chest x-ray. 2. Left basilar opacity may represent atelectasis or infiltrate in combination with a small left pleural effusion. 3. Emphysema. 4. Cardiomegaly. Electronically Signed   By: Katherine Mantle M.D.   On: 10/02/2019 19:47    Microbiology: Recent Results (from the past 240 hour(s))  SARS CORONAVIRUS 2 (TAT 6-24 HRS) Nasopharyngeal Nasopharyngeal Swab     Status: Abnormal   Collection Time: 10-25-19 11:53 AM   Specimen: Nasopharyngeal Swab  Result Value Ref Range Status   SARS Coronavirus 2 POSITIVE (A) NEGATIVE Final    Comment: RESULT CALLED TO, READ BACK BY AND VERIFIED WITH: N.MERRIUM RN [id number] 10-25-2019 MCCORMICK K (NOTE) SARS-CoV-2 target nucleic acids are DETECTED. The SARS-CoV-2 RNA is generally detectable in upper and lower respiratory specimens during the acute phase of infection. Positive results  are indicative of the presence of SARS-CoV-2 RNA. Clinical correlation with patient history  and other diagnostic information is  necessary to determine patient infection status. Positive results do not rule out bacterial infection or co-infection with other viruses.  The expected result is Negative. Fact Sheet for Patients: HairSlick.nohttps://www.fda.gov/media/138098/download Fact Sheet for Healthcare Providers: quierodirigir.comhttps://www.fda.gov/media/138095/download This test is not yet approved or cleared by the [place] FDA and  has been authorized for detection and/or diagnosis of SARS-CoV-2 by FDA under an Emergency Use Authorization (EUA). This EUA will remain  in effect (meaning this test can be used) fo r the duration of the COVID-19 declaration under Section 564(b)(1) of the Act, 21 U.S.C. section 360bbb-3(b)(1), unless the authorization is terminated or revoked sooner. Performed at [facility], [address] [place], [place] [address]   Blood Culture (routine x 2)     Status: None   Collection Time: 10/05/2019 11:53 AM   Specimen: BLOOD RIGHT WRIST  Result Value Ref Range Status   Specimen Description BLOOD RIGHT WRIST  Final   Special Requests   Final    BOTTLES DRAWN AEROBIC AND ANAEROBIC Blood Culture adequate volume   Culture   Final    NO GROWTH 5 DAYS Performed at [facility], [address] [place], [place] [address]    Report Status 10/17/2019 FINAL  Final  Blood Culture (routine x 2)     Status: None   Collection Time: 10/19/2019 11:53 AM   Specimen: BLOOD LEFT ARM  Result Value Ref Range Status   Specimen Description BLOOD LEFT ARM  Final   Special Requests   Final    BOTTLES DRAWN AEROBIC AND ANAEROBIC Blood Culture adequate volume   Culture   Final    NO GROWTH 5 DAYS Performed at [facility], [address] [place], [place] [address]    Report Status 10/17/2019 FINAL  Final  MRSA PCR Screening     Status: None   Collection Time: 10/13/19   7:55 PM   Specimen: Nasal Mucosa; Nasopharyngeal  Result Value Ref Range Status   MRSA by PCR NEGATIVE NEGATIVE Final    Comment:        The GeneXpert MRSA Assay (FDA approved for NASAL specimens only), is one component of a comprehensive MRSA colonization surveillance program. It is not intended to diagnose MRSA infection nor to guide or monitor treatment for MRSA infections. Performed at [facility], [address] [place], [place] [address]      Labs: Basic Metabolic Panel: Recent Labs  Lab 10/15/19 0540 10/16/19 0632 10/17/19 0707  NA 143 144 148*  K 4.6 4.3 4.4  CL 102 104 102  CO2 27 32 34*  GLUCOSE 228* 236* 279*  BUN 39* 35* 40*  CREATININE 0.67 0.69 0.75  CALCIUM 8.8* 8.6* 8.5*  MG 2.0 2.0 2.3   Liver Function Tests: Recent Labs  Lab 10/15/19 0540 10/16/19 0632 10/17/19 0707  AST 34 30 28  ALT 31 31 32  ALKPHOS 76 75 77  BILITOT 0.6 0.6 0.5  PROT 6.4* 5.8* 6.4*  ALBUMIN 2.8* 2.6* 2.9*   No results for input(s): LIPASE, AMYLASE in the last 168 hours. No results for input(s): AMMONIA in the last 168 hours. CBC: Recent Labs  Lab 10/15/19 0540 10/16/19 0632 10/17/19 0707  WBC 9.2 8.8 11.1*  NEUTROABS 8.5* 7.9* 10.0*  HGB 13.2 12.1 12.7  HCT 41.8 38.4 41.1  MCV 95.0 96.2 98.1  PLT 600* 518* 599*   Cardiac Enzymes: No results for input(s): CKTOTAL, CKMB, CKMBINDEX, TROPONINI in the last  168 hours. D-Dimer No results for input(s): DDIMER in the last 72 hours. BNP: Invalid input(s): POCBNP CBG: No results for input(s): GLUCAP in the last 168 hours. Anemia work up No results for input(s): VITAMINB12, FOLATE, FERRITIN, TIBC, IRON, RETICCTPCT in the last 72 hours. Urinalysis    Component Value Date/Time   COLORURINE STRAW (A) 10/02/2019 1935   APPEARANCEUR CLEAR (A) 10/02/2019 1935   LABSPEC 1.011 10/02/2019 1935   PHURINE 6.0 10/02/2019 1935   GLUCOSEU >=500 (A) 10/02/2019 1935   HGBUR NEGATIVE 10/02/2019 [place]  NEGATIVE 10/02/2019 [place] NEGATIVE 10/02/2019 1935   PROTEINUR NEGATIVE 10/02/2019 1935   NITRITE NEGATIVE 10/02/2019 1935   LEUKOCYTESUR NEGATIVE 10/02/2019 1935   Sepsis Labs Invalid input(s): PROCALCITONIN,  WBC,  LACTICIDVEN   SIGNED:  Enzo Bi, MD  [facility] 10/21/2019, 2:06 PM  If 7PM-7AM, please contact night-coverage

## 2019-10-26 NOTE — Progress Notes (Signed)
I spoke with patient's cousin Shelly Duran per nursing staff request. Per staff, family has made several threatening calls to the unit demanding to know the status of the patient wether she is "dead or alive". I attempted to explain to her that patient is currently on comfort care but still alive, unclear when she is expected to pass away but will continue to give update. She was very upset that we were not answering her questions and threatened to sue for lack of information. I attempted to inform her that we were taking care of her loved one who is actively dying as best as we can but she cut me off and became very belligerent and stated before she hung up the phone that " you will hear from my attorneys". I called patient's son  Shelly Duran to inform him that Shelly Duran was being very disruptive, calling the nursing station several times and being abusive to staff. He was very apologetic and promised that he will talk to the family member to let the patient pass in a dignify manner.   Shelly Falco, DNP, CCRN, FNP-C [facility] Nurse Practitioner Between 7pm to 7am - Pager [phone] Actively using [employer]iku secure chat messaging  After 7am go to [email or web address] - [email or web address]  [facility] [employer]  [phone]

## 2019-10-26 NOTE — Progress Notes (Signed)
Patient found unresponsive, with no pulses, not breathing, fingers blue and hands mottled. Unable to get a BP. NP and nursing supervisor notified and post mortem care completed.

## 2019-10-26 DEATH — deceased

## 2021-09-22 IMAGING — CT CT ANGIO CHEST
2 of 6 series · 18 of 46 positions shown · IV contrast (APPLIED)
Comparison: Radiograph yesterday. Chest CT 10/28/2018

CLINICAL DATA: Shortness of breath. Dementia patient with acute
onset of respiratory distress and hypoxia.

EXAM:
CT ANGIOGRAPHY CHEST WITH CONTRAST
TECHNIQUE: Multidetector CT imaging of the chest was performed using the
standard protocol during bolus administration of intravenous
contrast. Multiplanar CT image reconstructions and MIPs were
obtained to evaluate the vascular anatomy.
CONTRAST:  75mL OMNIPAQUE IOHEXOL 350 MG/ML SOLN

[Series 5: thins · axial · 0.61mm/px · z∈[-551,-296]mm · 15 of 281 slices shown]
[im 13/281  lung]
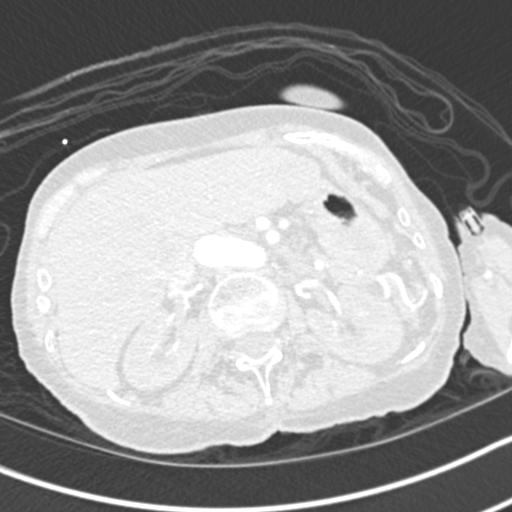
[im 37/281  soft-tissue]
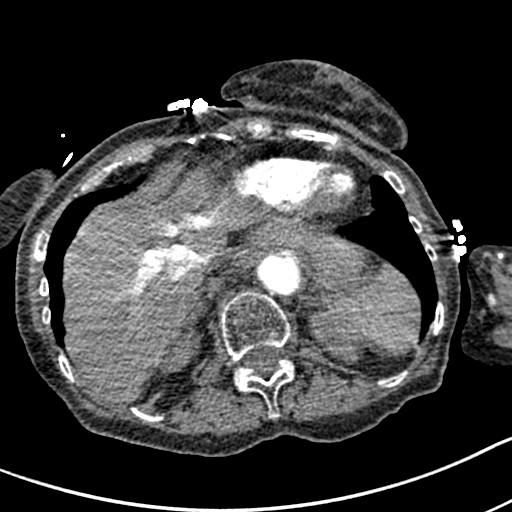
[im 49/281  lung]
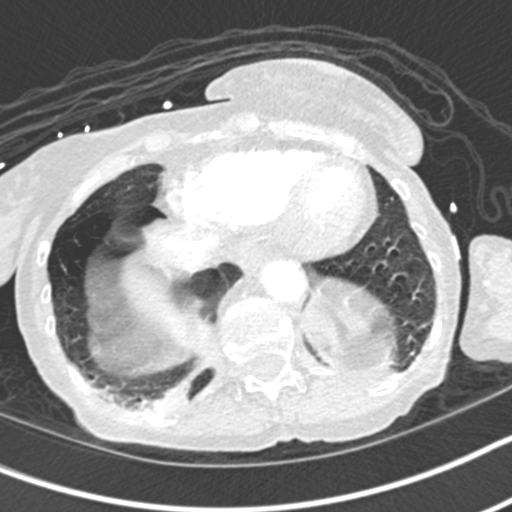
[im 74/281  soft-tissue]
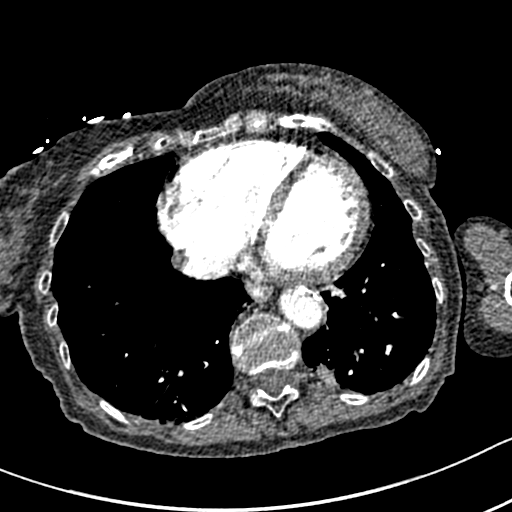
[im 86/281  lung]
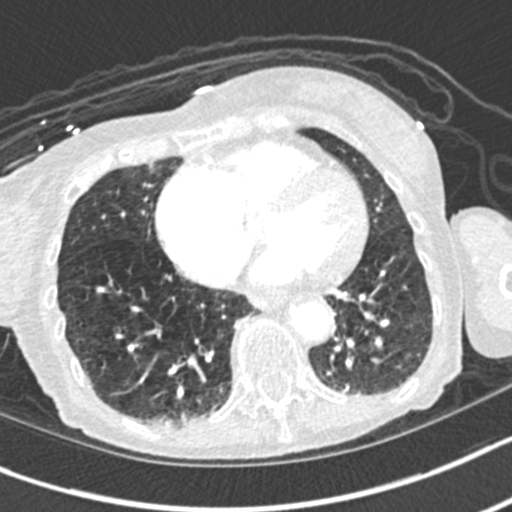
[im 110/281  soft-tissue]
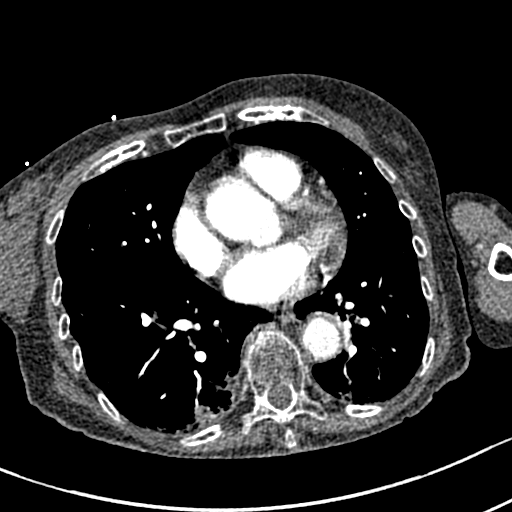
[im 122/281  lung]
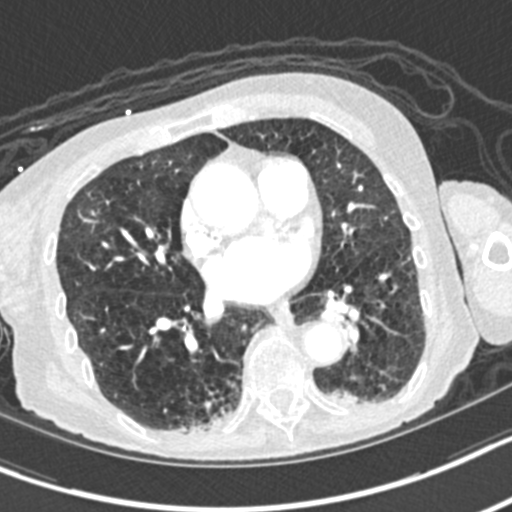
[im 147/281  soft-tissue]
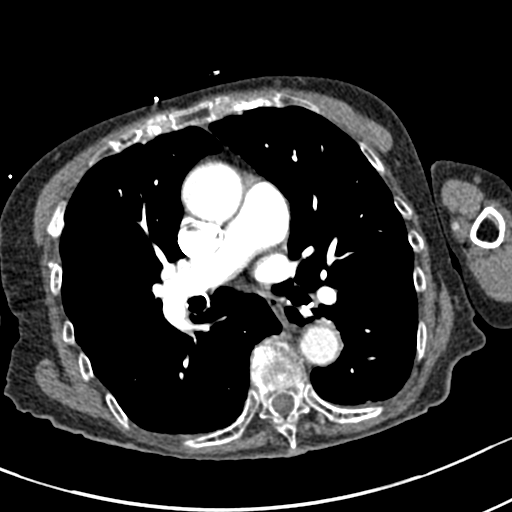
[im 159/281  lung]
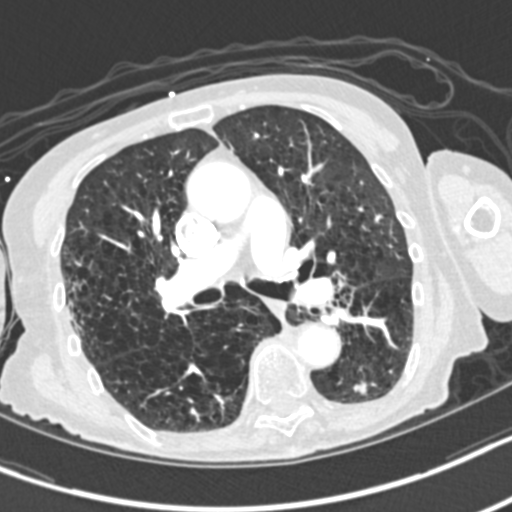
[im 171/281  soft-tissue]
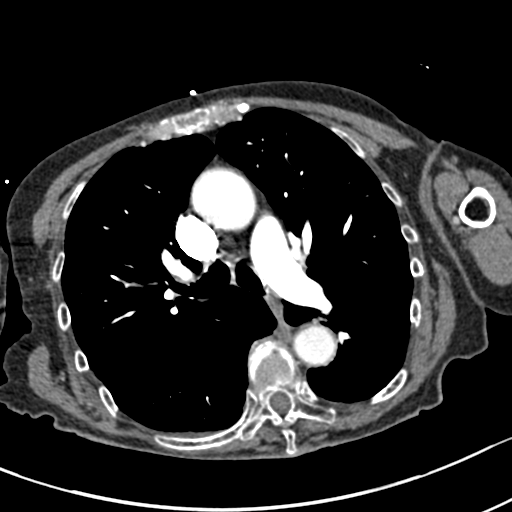
[im 195/281  lung]
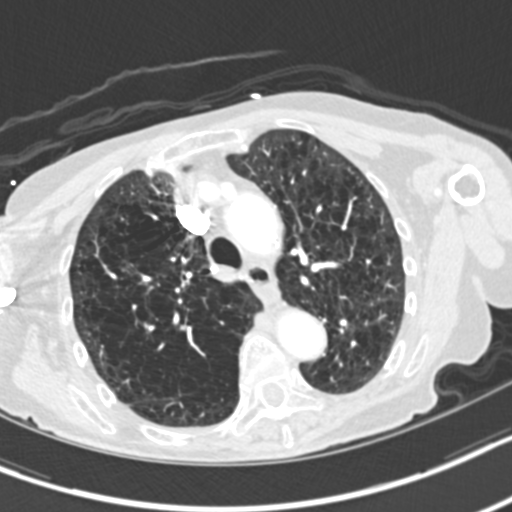
[im 207/281  soft-tissue]
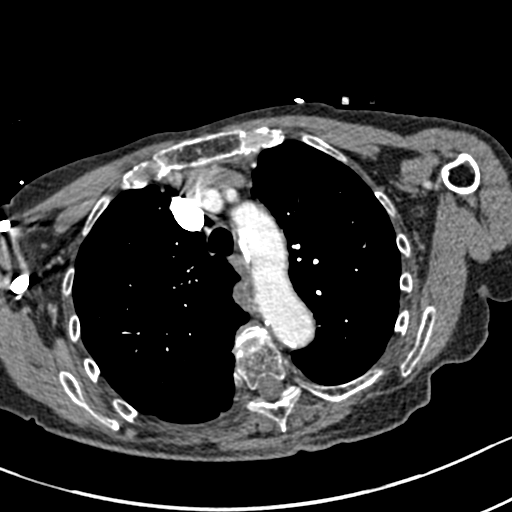
[im 232/281  lung]
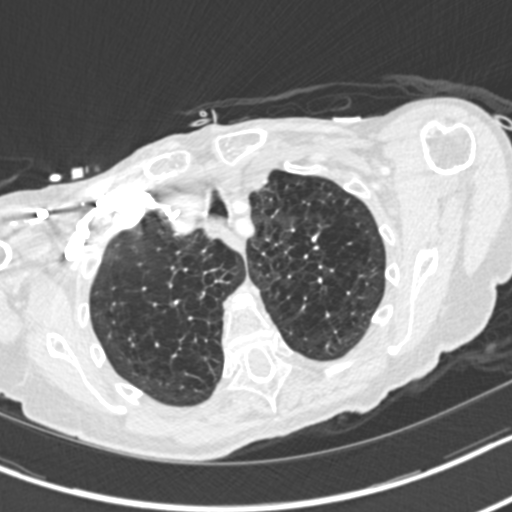
[im 244/281  soft-tissue]
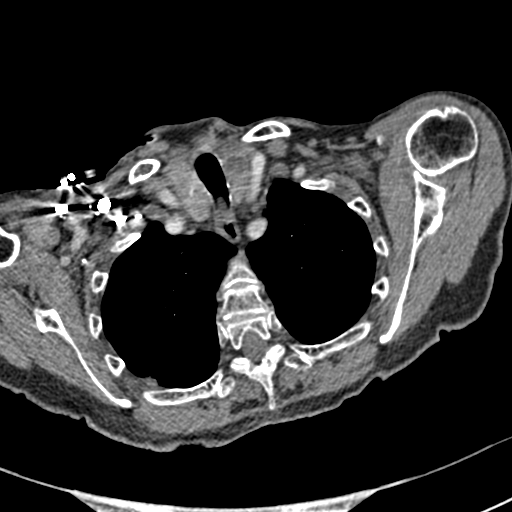
[im 268/281  lung]
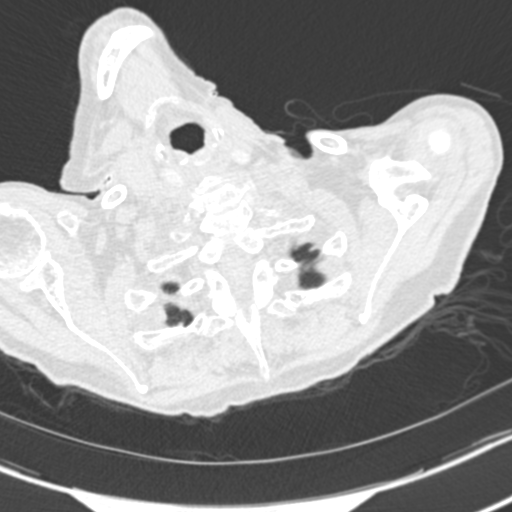

[Series 7: coronal mpr · coronal · 0.58mm/px · 3 of 73 slices shown]
[im 19/73  soft-tissue]
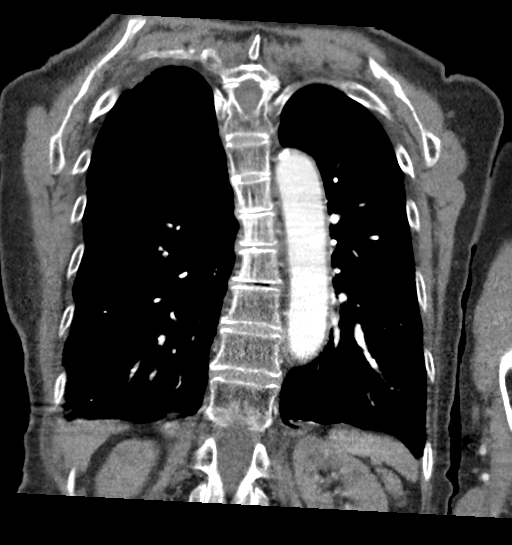
[im 37/73  soft-tissue]
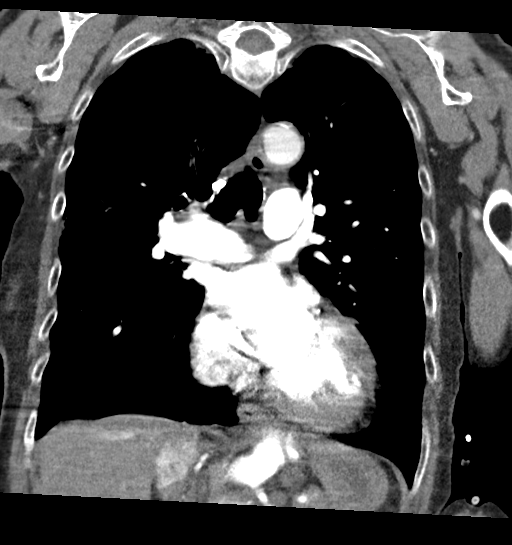
[im 55/73  soft-tissue]
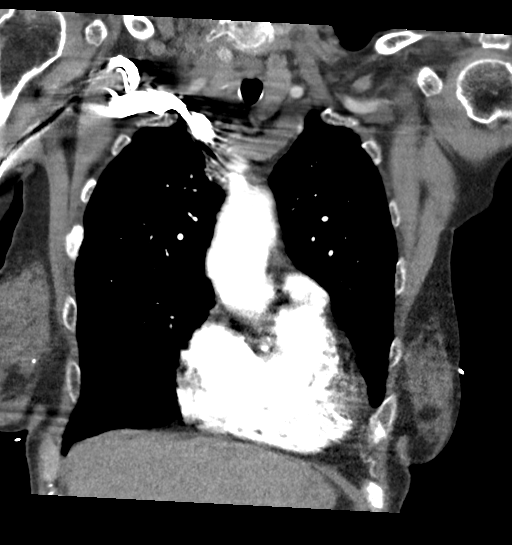

[18 of 46 positions shown; findings below may reference images not displayed]

FINDINGS: Cardiovascular: There are no filling defects within the pulmonary
arteries to suggest pulmonary embolus. Atherosclerosis of the
thoracic aorta. No aortic dissection or aneurysm. Descending
thoracic aorta is tortuous. There are coronary artery
calcifications. Mild cardiomegaly with right heart dilatation.
Contrast refluxes into the hepatic veins and IVC. No pericardial
effusion.

Mediastinum/Nodes: No enlarged mediastinal or hilar lymph nodes.
Patulous mid esophagus. Thyroid nodules which were previously
evaluated by ultrasound.

Lungs/Pleura: Moderate to advanced emphysema. Dependent atelectasis
in both lower lobes. No pleural fluid. Minimal subpleural scarring
in the right middle lobe. Improved central bronchial thickening from
prior exam. There is a 6 x 6 mm nodule in the superior segment of
the left lower lobe, series 6, image 42. This with likely present on
prior exam but was previously obscured by adjacent atelectasis.
There is dependent debris within the trachea at the thoracic inlet.

Upper Abdomen: Contrast refluxes into the hepatic veins and IVC. Per
abdominal aortic tortuosity.

Musculoskeletal: Exaggerated thoracic kyphosis. Right anterior lower
rib fractures are likely subacute with some surrounding callus
formation. No acute osseous abnormality.

Review of the MIP images confirms the above findings.
IMPRESSION: 1. No pulmonary embolus.
2. Mild cardiomegaly with right heart dilatation and reflux of
contrast into the hepatic veins and IVC, consistent with elevated
right heart pressures.
3. Right anterior lower rib fractures are likely subacute with some
surrounding callus formation.
4. Left lobe 6 mm pulmonary nodule, possibly present on exam 11
months ago but previously obscured. Non-contrast chest CT at 6-12
months is recommended. If the nodule is stable at time of repeat CT,
then future CT at 18-24 months (from today's scan) is considered
optional for low-risk patients, but is recommended for high-risk
patients. This recommendation follows the consensus statement:
Guidelines for Management of Incidental Pulmonary Nodules Detected
[DATE].
5. Debris/mucus in the dependent trachea.
6. Emphysema.
7. Aortic atherosclerosis and coronary artery calcifications.

Aortic Atherosclerosis ([id number].Y) and Emphysema ([id number].Z).

## 2021-10-01 IMAGING — DX DG CHEST 1V PORT
1 series · 2 of 2 positions shown · non-contrast
Comparison: 10/02/2019

CLINICAL DATA: MAOXR-1X positive 10/09/2019.  Hypoxia.

EXAM:
PORTABLE CHEST 1 VIEW

[Series 1: chest ap · 0.14mm/px · 2 of 2 slices shown]
[im 1/2]
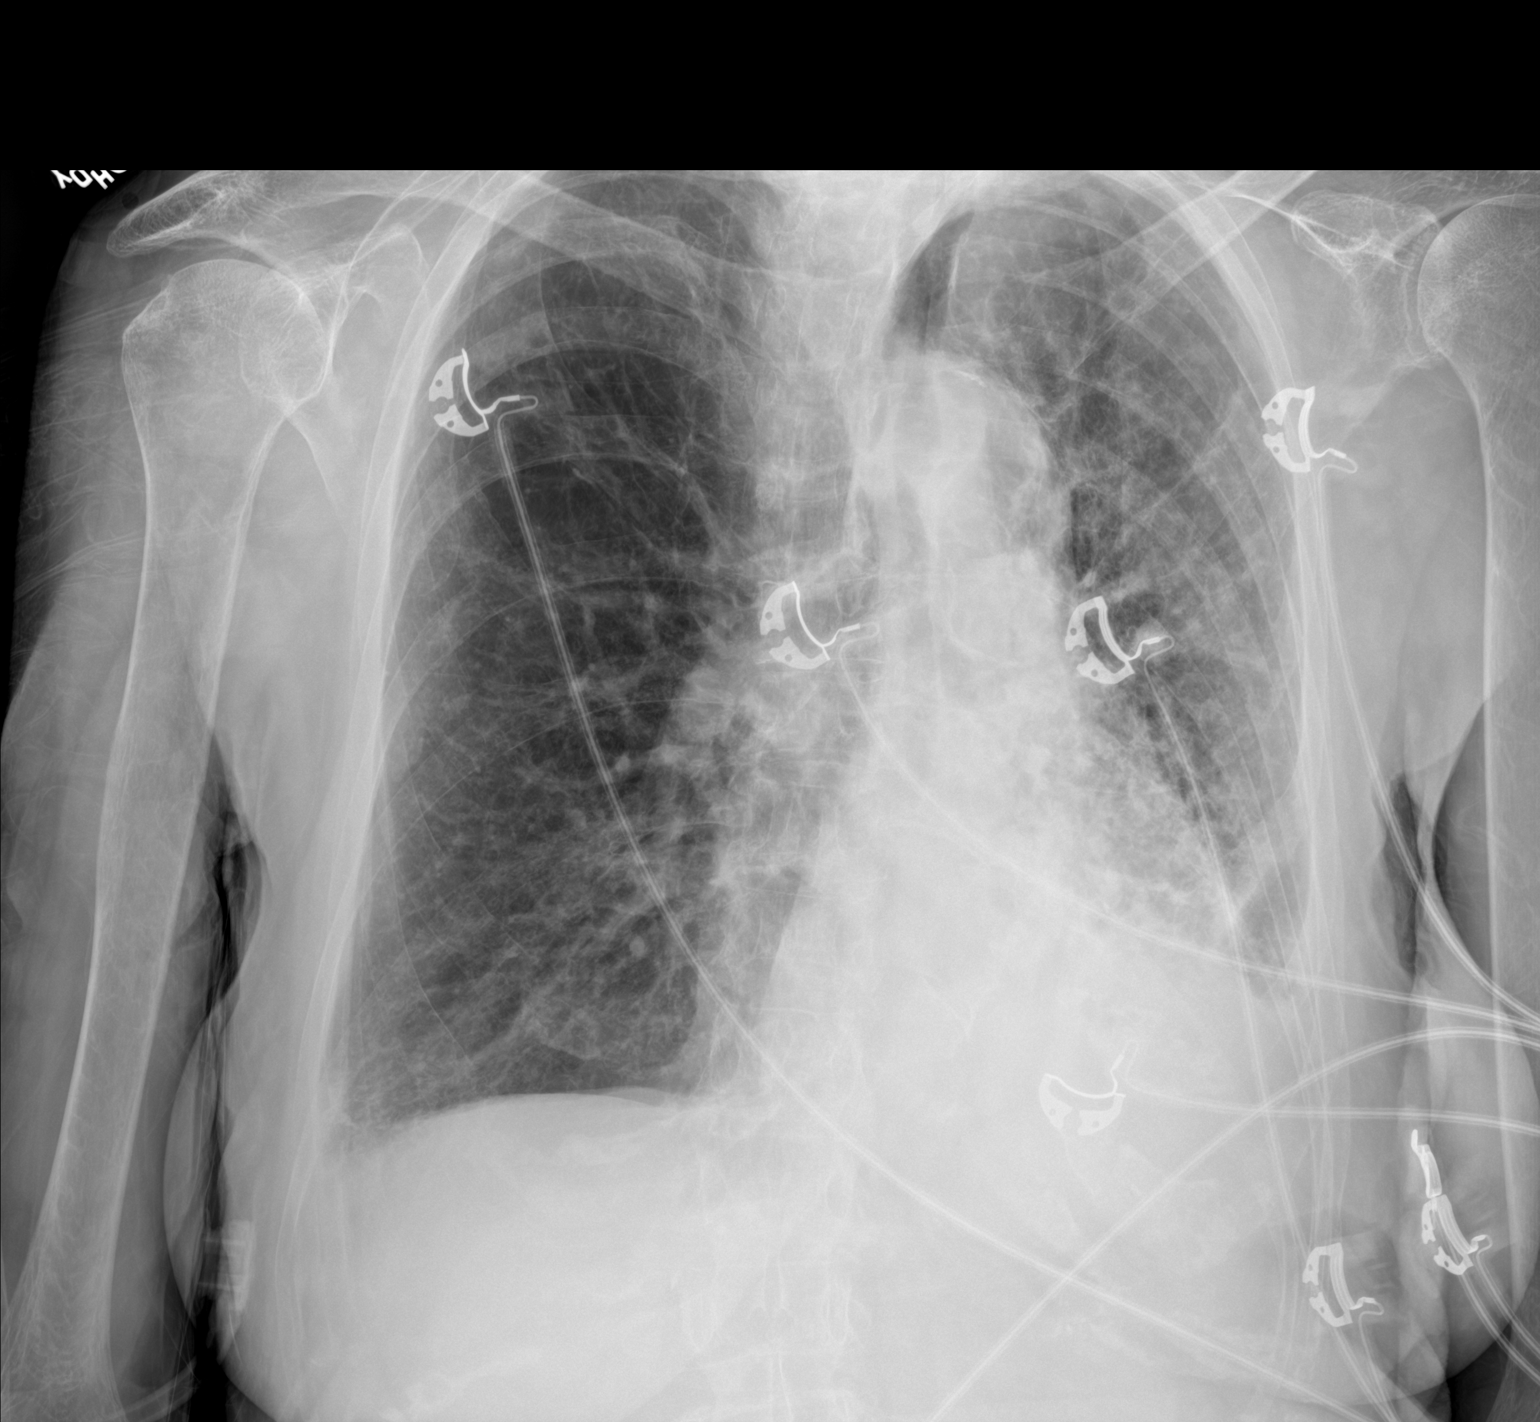
[im 2/2]
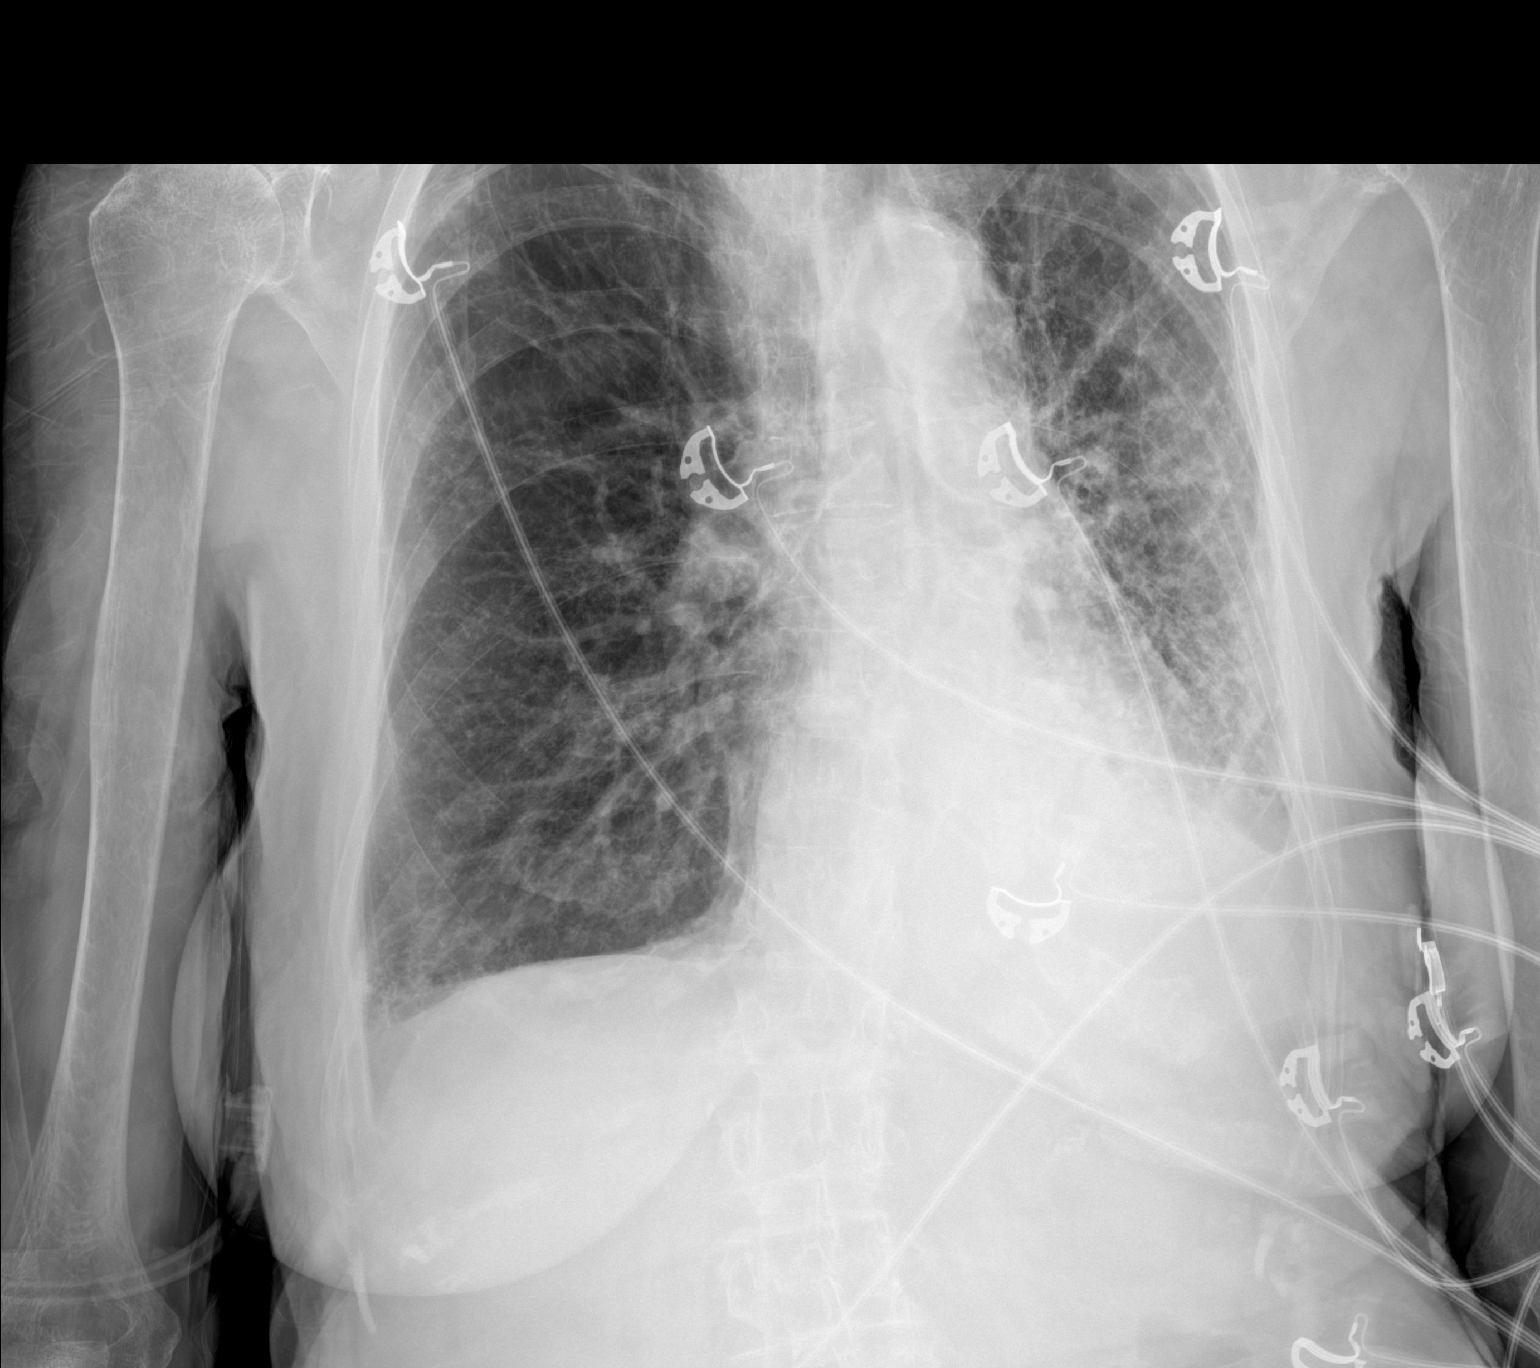

[2 of 2 positions shown; findings below may reference images not displayed]

FINDINGS: Patient is rotated to the left. Lungs are adequately inflated. There
is mild emphysematous disease over the upper lungs. There is
moderate opacification over the left mid to lower lung and left
retrocardiac region. Findings may be due to effusion with
atelectasis as infection in the left base is possible. Minimal
prominence of the left perihilar markings. Cardiomediastinal
silhouette and remainder of the exam is unchanged.
IMPRESSION: New opacification over the left base/retrocardiac region which may
be due to effusion with atelectasis although infection is possible.

Mild emphysematous disease.
